# Patient Record
Sex: Female | Born: 1940 | Race: White | Hispanic: No | Smoking: Never smoker
Health system: Southern US, Community
[De-identification: ages and names within clinical notes are randomized; demographics above are authoritative.]

## PROBLEM LIST (undated history)

## (undated) DIAGNOSIS — C801 Malignant (primary) neoplasm, unspecified: Secondary | ICD-10-CM

## (undated) DIAGNOSIS — F419 Anxiety disorder, unspecified: Secondary | ICD-10-CM

## (undated) DIAGNOSIS — M810 Age-related osteoporosis without current pathological fracture: Secondary | ICD-10-CM

## (undated) DIAGNOSIS — H3530 Unspecified macular degeneration: Secondary | ICD-10-CM

## (undated) DIAGNOSIS — F605 Obsessive-compulsive personality disorder: Secondary | ICD-10-CM

## (undated) HISTORY — PX: MASTECTOMY: SHX3

## (undated) HISTORY — PX: NASAL SEPTUM SURGERY: SHX37

## (undated) HISTORY — PX: OOPHORECTOMY: SHX86

## (undated) HISTORY — DX: Unspecified macular degeneration: H35.30

## (undated) HISTORY — DX: Obsessive-compulsive personality disorder: F60.5

## (undated) HISTORY — PX: TUBAL LIGATION: SHX77

## (undated) HISTORY — DX: Anxiety disorder, unspecified: F41.9

## (undated) HISTORY — PX: BREAST SURGERY: SHX581

## (undated) HISTORY — DX: Age-related osteoporosis without current pathological fracture: M81.0

## (undated) HISTORY — PX: FOOT SURGERY: SHX648

## (undated) HISTORY — DX: Malignant (primary) neoplasm, unspecified: C80.1

## (undated) HISTORY — PX: TONSILLECTOMY AND ADENOIDECTOMY: SHX28

---

## 1994-01-31 LAB — HM MAMMOGRAPHY: HM MAMMO: NORMAL (ref 0–4)

## 1998-02-27 ENCOUNTER — Other Ambulatory Visit: Admission: RE | Admit: 1998-02-27 | Discharge: 1998-02-27 | Payer: Self-pay | Admitting: Obstetrics and Gynecology

## 1999-03-19 ENCOUNTER — Other Ambulatory Visit: Admission: RE | Admit: 1999-03-19 | Discharge: 1999-03-19 | Payer: Self-pay | Admitting: Obstetrics and Gynecology

## 2000-07-14 ENCOUNTER — Ambulatory Visit (HOSPITAL_BASED_OUTPATIENT_CLINIC_OR_DEPARTMENT_OTHER): Admission: RE | Admit: 2000-07-14 | Discharge: 2000-07-14 | Payer: Self-pay | Admitting: General Surgery

## 2000-07-14 ENCOUNTER — Encounter (INDEPENDENT_AMBULATORY_CARE_PROVIDER_SITE_OTHER): Payer: Self-pay | Admitting: *Deleted

## 2000-11-17 ENCOUNTER — Ambulatory Visit (HOSPITAL_COMMUNITY): Admission: RE | Admit: 2000-11-17 | Discharge: 2000-11-17 | Payer: Self-pay | Admitting: Gastroenterology

## 2003-01-30 ENCOUNTER — Ambulatory Visit (HOSPITAL_COMMUNITY): Admission: RE | Admit: 2003-01-30 | Discharge: 2003-01-30 | Payer: Self-pay | Admitting: Obstetrics and Gynecology

## 2003-05-08 ENCOUNTER — Ambulatory Visit (HOSPITAL_COMMUNITY): Admission: RE | Admit: 2003-05-08 | Discharge: 2003-05-08 | Payer: Self-pay | Admitting: Obstetrics and Gynecology

## 2003-09-08 ENCOUNTER — Ambulatory Visit (HOSPITAL_COMMUNITY): Admission: RE | Admit: 2003-09-08 | Discharge: 2003-09-08 | Payer: Self-pay | Admitting: Obstetrics and Gynecology

## 2004-08-06 ENCOUNTER — Encounter: Admission: RE | Admit: 2004-08-06 | Discharge: 2004-08-06 | Payer: Self-pay | Admitting: Obstetrics and Gynecology

## 2005-04-01 ENCOUNTER — Other Ambulatory Visit: Admission: RE | Admit: 2005-04-01 | Discharge: 2005-04-01 | Payer: Self-pay | Admitting: Obstetrics and Gynecology

## 2005-05-13 ENCOUNTER — Ambulatory Visit (HOSPITAL_COMMUNITY): Admission: RE | Admit: 2005-05-13 | Discharge: 2005-05-13 | Payer: Self-pay | Admitting: Obstetrics and Gynecology

## 2006-05-12 ENCOUNTER — Other Ambulatory Visit: Admission: RE | Admit: 2006-05-12 | Discharge: 2006-05-12 | Payer: Self-pay | Admitting: Obstetrics and Gynecology

## 2006-08-11 ENCOUNTER — Ambulatory Visit (HOSPITAL_COMMUNITY): Admission: RE | Admit: 2006-08-11 | Discharge: 2006-08-11 | Payer: Self-pay | Admitting: Obstetrics and Gynecology

## 2007-06-01 ENCOUNTER — Other Ambulatory Visit: Admission: RE | Admit: 2007-06-01 | Discharge: 2007-06-01 | Payer: Self-pay | Admitting: Obstetrics and Gynecology

## 2007-06-15 ENCOUNTER — Ambulatory Visit (HOSPITAL_COMMUNITY): Admission: RE | Admit: 2007-06-15 | Discharge: 2007-06-15 | Payer: Self-pay | Admitting: Obstetrics and Gynecology

## 2008-06-06 ENCOUNTER — Other Ambulatory Visit: Admission: RE | Admit: 2008-06-06 | Discharge: 2008-06-06 | Payer: Self-pay | Admitting: Obstetrics and Gynecology

## 2008-08-08 ENCOUNTER — Ambulatory Visit (HOSPITAL_COMMUNITY): Admission: RE | Admit: 2008-08-08 | Discharge: 2008-08-08 | Payer: Self-pay | Admitting: Unknown Physician Specialty

## 2009-06-05 ENCOUNTER — Other Ambulatory Visit: Admission: RE | Admit: 2009-06-05 | Discharge: 2009-06-05 | Payer: Self-pay | Admitting: Obstetrics and Gynecology

## 2010-01-31 HISTORY — PX: ABDOMINAL HYSTERECTOMY: SHX81

## 2010-01-31 LAB — HM COLONOSCOPY: HM Colonoscopy: NORMAL

## 2010-08-13 ENCOUNTER — Emergency Department (HOSPITAL_COMMUNITY)
Admission: EM | Admit: 2010-08-13 | Discharge: 2010-08-13 | Disposition: A | Discharge status: Home / Self Care | Payer: Medicare Other | Attending: Emergency Medicine | Admitting: Emergency Medicine

## 2010-08-13 ENCOUNTER — Emergency Department (HOSPITAL_COMMUNITY): Payer: Medicare Other

## 2010-08-13 DIAGNOSIS — Y9301 Activity, walking, marching and hiking: Secondary | ICD-10-CM | POA: Insufficient documentation

## 2010-08-13 DIAGNOSIS — M79609 Pain in unspecified limb: Secondary | ICD-10-CM | POA: Insufficient documentation

## 2010-08-13 DIAGNOSIS — S93609A Unspecified sprain of unspecified foot, initial encounter: Secondary | ICD-10-CM | POA: Insufficient documentation

## 2010-08-13 DIAGNOSIS — X500XXA Overexertion from strenuous movement or load, initial encounter: Secondary | ICD-10-CM | POA: Insufficient documentation

## 2011-06-09 ENCOUNTER — Other Ambulatory Visit (HOSPITAL_COMMUNITY)
Admission: RE | Admit: 2011-06-09 | Discharge: 2011-06-09 | Disposition: A | Discharge status: Home / Self Care | Payer: Medicare Other | Source: Ambulatory Visit | Attending: Family Medicine | Admitting: Family Medicine

## 2011-06-09 ENCOUNTER — Other Ambulatory Visit: Payer: Self-pay | Admitting: Family Medicine

## 2011-06-09 DIAGNOSIS — Z124 Encounter for screening for malignant neoplasm of cervix: Secondary | ICD-10-CM | POA: Insufficient documentation

## 2011-06-21 ENCOUNTER — Ambulatory Visit: Payer: Medicare Other | Attending: Internal Medicine | Admitting: Physical Therapy

## 2011-06-21 DIAGNOSIS — R293 Abnormal posture: Secondary | ICD-10-CM | POA: Insufficient documentation

## 2011-06-21 DIAGNOSIS — M6281 Muscle weakness (generalized): Secondary | ICD-10-CM | POA: Insufficient documentation

## 2011-06-21 DIAGNOSIS — IMO0001 Reserved for inherently not codable concepts without codable children: Secondary | ICD-10-CM | POA: Insufficient documentation

## 2011-07-05 ENCOUNTER — Ambulatory Visit: Payer: Medicare Other | Attending: Internal Medicine | Admitting: Physical Therapy

## 2011-07-05 DIAGNOSIS — IMO0001 Reserved for inherently not codable concepts without codable children: Secondary | ICD-10-CM | POA: Insufficient documentation

## 2011-07-05 DIAGNOSIS — R293 Abnormal posture: Secondary | ICD-10-CM | POA: Insufficient documentation

## 2011-07-05 DIAGNOSIS — M6281 Muscle weakness (generalized): Secondary | ICD-10-CM | POA: Insufficient documentation

## 2011-07-19 ENCOUNTER — Encounter: Payer: Medicare Other | Admitting: Physical Therapy

## 2011-07-21 ENCOUNTER — Ambulatory Visit: Payer: Medicare Other | Admitting: Physical Therapy

## 2011-07-26 ENCOUNTER — Ambulatory Visit: Payer: Medicare Other | Admitting: Physical Therapy

## 2011-08-02 ENCOUNTER — Encounter: Payer: Medicare Other | Admitting: Physical Therapy

## 2013-02-18 ENCOUNTER — Ambulatory Visit: Payer: Medicare PPO

## 2013-02-18 ENCOUNTER — Ambulatory Visit (INDEPENDENT_AMBULATORY_CARE_PROVIDER_SITE_OTHER): Payer: Medicare PPO | Admitting: Emergency Medicine

## 2013-02-18 VITALS — BP 124/82 | HR 74 | Temp 98.0°F | Resp 16 | Ht 68.0 in | Wt 150.0 lb

## 2013-02-18 DIAGNOSIS — IMO0001 Reserved for inherently not codable concepts without codable children: Secondary | ICD-10-CM

## 2013-02-18 DIAGNOSIS — M79609 Pain in unspecified limb: Secondary | ICD-10-CM

## 2013-02-18 DIAGNOSIS — S62639A Displaced fracture of distal phalanx of unspecified finger, initial encounter for closed fracture: Secondary | ICD-10-CM

## 2013-02-18 DIAGNOSIS — S6980XA Other specified injuries of unspecified wrist, hand and finger(s), initial encounter: Secondary | ICD-10-CM

## 2013-02-18 DIAGNOSIS — S6990XA Unspecified injury of unspecified wrist, hand and finger(s), initial encounter: Secondary | ICD-10-CM

## 2013-02-18 NOTE — Patient Instructions (Signed)
Finger Fracture  Fractures of fingers are breaks in the bones of the fingers. There are many types of fractures. There are different ways of treating these fractures. Your health care provider will discuss the best way to treat your fracture.  CAUSES  Traumatic injury is the main cause of broken fingers. These include:  · Injuries while playing sports.  · Workplace injuries.  · Falls.  RISK FACTORS  Activities that can increase your risk of finger fractures include:  · Sports.  · Workplace activities that involve machinery.  · A condition called osteoporosis, which can make your bones less dense and cause them to fracture more easily.  SIGNS AND SYMPTOMS  The main symptoms of a broken finger are pain and swelling within 15 minutes after the injury. Other symptoms include:  · Bruising of your finger.  · Stiffness of your finger.  · Numbness of your finger.  · Exposed bones (compound fracture) if the fracture is severe.  DIAGNOSIS   The best way to diagnose a broken bone is with X-ray imaging. Additionally, your health care provider will use this X-ray image to evaluate the position of the broken finger bones.   TREATMENT   Finger fractures can be treated with:   · Nonreduction This means the bones are in place. The finger is splinted without changing the positions of the bone pieces. The splint is usually left on for about a week to 10 days. This will depend on your fracture and what your health care provider thinks.  · Closed reduction The bones are put back into position without using surgery. The finger is then splinted.  · Open reduction and internal fixation The fracture site is opened. Then the bone pieces are fixed into place with pins or some type of hardware. This is seldom required. It depends on the severity of the fracture.  HOME CARE INSTRUCTIONS   · Follow your health care provider's instructions regarding activities, exercises, and physical therapy.  · Only take over-the-counter or prescription  medicines for pain, discomfort, or fever as directed by your health care provider.  SEEK MEDICAL CARE IF:  You have pain or swelling that limits the motion or use of your fingers.  SEEK IMMEDIATE MEDICAL CARE IF:   Your finger becomes numb.  MAKE SURE YOU:   · Understand these instructions.  · Will watch your condition.  · Will get help right away if you are not doing well or get worse.  Document Released: 05/01/2000 Document Revised: 11/07/2012 Document Reviewed: 08/29/2012  ExitCare® Patient Information ©2014 [employer].

## 2013-02-18 NOTE — Progress Notes (Addendum)
[facility] [address], [place] [address] [phone]  Date:  02/18/2013   Name:  Yolanda Robbins   DOB:  [birth year]   MRN:  [id number]  PCP:  No primary provider on file.    Chief Complaint: Finger Pain   History of Present Illness:  Yolanda Robbins is a 73 y.o. very pleasant female patient who presents with the following:  Tripped carrying a computer down the stairs and it crushed her left third finger.  Denies other injury related to fall.  Says it is worse since injury yesterday.  No improvement with over the counter medications or other home remedies. jd   There are no active problems to display for this patient.   Past Medical History  Diagnosis Date  . Cancer   . Osteoporosis     Past Surgical History  Procedure Laterality Date  . Breast surgery      History  Substance Use Topics  . Smoking status: Never Smoker   . Smokeless tobacco: Not on file  . Alcohol Use: No    Family History  Problem Relation Age of Onset  . Adopted: Yes    No Known Allergies  Medication list has been reviewed and updated.  No current outpatient prescriptions on file prior to visit.   No current facility-administered medications on file prior to visit.    Review of Systems:  As per HPI, otherwise negative.    Physical Examination: Filed Vitals:   02/18/13 1732  BP: 124/82  Pulse: 74  Temp: 98 F (36.7 C)  Resp: 16   Filed Vitals:   02/18/13 1732  Height: 5\' 8"  (1.727 m)  Weight: 150 lb (68.04 kg)   Body mass index is 22.81 kg/(m^2). Ideal Body Weight: Weight in (lb) to have BMI = 25: 164.1   GEN: WDWN, NAD, Non-toxic, Alert & Oriented x 3 HEENT: Atraumatic, Normocephalic.  Ears and Nose: No external deformity. EXTR: No clubbing/cyanosis/edema NEURO: Normal gait.  PSYCH: Normally interactive. Conversant. Not depressed or anxious appearing.  Calm demeanor.  Left hand:  Ecchymotic and swollen left third finger.  Has subungal  hematoma.  No deformity.  Assessment and Plan: subungal hematoma Evacuate hematoma  Signed,  Ellison Carwin, MD   [facility] reading (PRIMARY) by  Dr. Ouida Sills.  Tuft fracture.

## 2013-02-25 ENCOUNTER — Ambulatory Visit (INDEPENDENT_AMBULATORY_CARE_PROVIDER_SITE_OTHER): Payer: Medicare PPO | Admitting: Family Medicine

## 2013-02-25 ENCOUNTER — Encounter: Payer: Self-pay | Admitting: Family Medicine

## 2013-02-25 ENCOUNTER — Ambulatory Visit: Payer: Medicare PPO

## 2013-02-25 VITALS — BP 110/72 | HR 68 | Temp 98.0°F | Resp 16 | Ht 68.0 in | Wt 152.0 lb

## 2013-02-25 DIAGNOSIS — S6980XA Other specified injuries of unspecified wrist, hand and finger(s), initial encounter: Secondary | ICD-10-CM

## 2013-02-25 DIAGNOSIS — S62639A Displaced fracture of distal phalanx of unspecified finger, initial encounter for closed fracture: Secondary | ICD-10-CM

## 2013-02-25 DIAGNOSIS — M79609 Pain in unspecified limb: Secondary | ICD-10-CM

## 2013-02-25 DIAGNOSIS — S6990XA Unspecified injury of unspecified wrist, hand and finger(s), initial encounter: Secondary | ICD-10-CM

## 2013-02-25 NOTE — Patient Instructions (Signed)
1. You can wear long finger splint for one more week OR you can switch to short finger splint now. 2.  Wear finger splint for 4 weeks total. 3. Return in two weeks if you are still suffering with significant finger pain.

## 2013-02-25 NOTE — Progress Notes (Addendum)
This chart was scribed for Wardell Honour, MD by Einar Pheasant, ED Scribe. This patient was seen in room 11 and the patient's care was started at 8:26 PM. Subjective:    Patient ID: Yolanda Robbins, female    DOB: [birth year], 72 y.o.   MRN: [id number]  Chief Complaint  Patient presents with  . Follow-up    left hand middle finger smashed the tip of finger last week    HPI HPI Comments: Yolanda Robbins is a 73 y.o. female who [facility] for one week follow up of an injury to the middle finger of her left hand; +tuft fracture of L middle finger with subungal hematoma s/p evacuation by Dr. Ouida Sills. She was seen 1 week ago by Dr[facility] Ouida Sills [facility]; s/p subungal hematoma evacuation. Pt states that she injured her finger when she fell down the steps while carrying a computer in her hands. Pt reports no problems with finger unless she accidentally puts pressure on it. Pt states that everytime she changes her splint she applies peroxide to clean the area. She reports occasional tingling and mild tenderness on the side of her finger. Otherwise, pt is a healthy.  Compliance with finger splint.   Past Medical History  Diagnosis Date  . Cancer   . Osteoporosis    No Known Allergies No current outpatient prescriptions on file prior to visit.   No current facility-administered medications on file prior to visit.    Review of Systems  Musculoskeletal: Positive for arthralgias and joint swelling.  Skin: Positive for color change and wound (middle finger of left hand).      Triage Vitals: BP 110/72  Pulse 68  Temp(Src) 98 F (36.7 C) (Yolanda)  Resp 16  Ht 5\' 8"  (1.727 m)  Wt 152 lb (68.947 kg)  BMI 23.12 kg/m2  SpO2 99% Objective:   Physical Exam  Nursing note and vitals reviewed. Constitutional: She is oriented to person, place, and time. She appears well-developed and well-nourished. No distress.  HENT:  Head: Normocephalic and atraumatic.    Eyes: EOM are normal.  Neck: Neck supple.  Cardiovascular: Intact distal pulses.   Capillary refill < 3 seconds L 3rd digit.  Pulmonary/Chest: Effort normal.  Musculoskeletal:  L third finger with mild swelling at DIP joint and distally.  +mild TTP along lateral aspect of DIP and distal finger.    Neurological: She is alert and oriented to person, place, and time. No sensory deficit.  Skin: Skin is warm and dry.  L 3rd fingernail with residual hematoma.  Psychiatric: She has a normal mood and affect. Her behavior is normal.    [facility] reading (PRIMARY) by  Dr. Tamala [place].  L 3rd finger:  Stable tuft fracture.     No results found for this or any previous visit.  Assessment & Plan:   1. Closed fracture of tuft of distal phalanx of finger    1.  Closed tuft fracture L 3rd finger: stable; transition in upcoming week to shorter fold over finger splint to allow range of motion of DIP.  Continue fold over splint for maximum of 3-4 weeks.  RTC two weeks if continues to suffer with moderate pain.  Otherwise, follow-up PRN only.   2. Subungal hematoma: stable.  I personally performed the services described in this documentation, which was scribed in my presence.  The recorded information has been reviewed and is accurate.  Reginia Forts, M.D.  [facility]  [facility] [address] [place], [place]  [address] [phone] phone [phone] fax

## 2014-01-31 LAB — HM DEXA SCAN

## 2014-10-07 ENCOUNTER — Ambulatory Visit: Payer: Medicare PPO | Attending: Internal Medicine | Admitting: Physical Therapy

## 2014-10-07 ENCOUNTER — Encounter: Payer: Self-pay | Admitting: Physical Therapy

## 2014-10-07 DIAGNOSIS — R2689 Other abnormalities of gait and mobility: Secondary | ICD-10-CM

## 2014-10-07 DIAGNOSIS — R29818 Other symptoms and signs involving the nervous system: Secondary | ICD-10-CM | POA: Insufficient documentation

## 2014-10-07 DIAGNOSIS — R269 Unspecified abnormalities of gait and mobility: Secondary | ICD-10-CM | POA: Insufficient documentation

## 2014-10-07 DIAGNOSIS — R2681 Unsteadiness on feet: Secondary | ICD-10-CM

## 2014-10-07 DIAGNOSIS — R296 Repeated falls: Secondary | ICD-10-CM | POA: Diagnosis present

## 2014-10-08 NOTE — Therapy (Signed)
[facility] [address] [place], [place], [address] Phone: [phone]   Fax:  [phone]  Physical Therapy Evaluation  Patient Details  Name: Yolanda Robbins MRN: [id number] Date of Birth: December 27, 1940 Referring Provider:  Delrae Rend, MD  Encounter Date: 10/07/2014      PT End of Session - 10/07/14 1400    Visit Number 1   Number of Visits 4   Date for PT Re-Evaluation 11/07/14   Authorization Type Medicare G-code every 10th visit   PT Start Time 1400   PT Stop Time 1445   PT Time Calculation (min) 45 min   Equipment Utilized During Treatment Gait belt   Activity Tolerance Patient tolerated treatment well   Behavior During Therapy [facility] for tasks assessed/performed      Past Medical History  Diagnosis Date  . Cancer   . Osteoporosis     Past Surgical History  Procedure Laterality Date  . Breast surgery      There were no vitals filed for this visit.  Visit Diagnosis:  Abnormality of gait  Unsteadiness  Balance problems  Recurrent falls while walking      Subjective Assessment - 10/07/14 1403    Subjective She reports that she has fallen 4 times all with environmental issues as primary factor. She was presented for annual PCP appointment on 08/22/2014. She was referred to PT for evaluation & training for fall prevention. She presents for PT evaluation.    Patient Stated Goals She wants to improve her posture and improve balance.   Currently in Pain? No/denies            [facility] PT Assessment - 10/07/14 1400    Assessment   Medical Diagnosis Falls. gait abnormality   Onset Date/Surgical Date 08/22/14   Precautions   Precautions Fall   Restrictions   Weight Bearing Restrictions No   Balance Screen   Has the patient fallen in the past 6 months Yes   How many times? 4   Has the patient had a decrease in activity level because of a fear of falling?  No   Is the patient reluctant to leave  their home because of a fear of falling?  No   Home Environment   Living Environment Private residence   Living Arrangements Alone   Type of [place] to enter   Entrance Stairs-Number of Steps 2   Entrance Stairs-Rails None   Home Layout One level;Laundry or work area in [employer] in garage with 2 steps no rails   Prior Function   Level of Independence Independent;Independent with gait;Independent with transfers   Vocation Retired   Leisure aquatics   Observation/Other Assessments   Focus on Therapeutic Outcomes ([place])  97 Functional Status; Risk Adjusted 49%   Activities of Balance Confidence Scale (ABC Scale)  100%   Fear Avoidance Belief Questionnaire (FABQ)  19%   Posture/Postural Control   Posture/Postural Control Postural limitations   Postural Limitations Rounded Shoulders;Forward head;Increased thoracic kyphosis;Posterior pelvic tilt  Upper body scoliosis shift right to pelvis    ROM / Strength   AROM / PROM / Strength AROM;Strength   AROM   Overall AROM  Within functional limits for tasks performed   Strength   Overall Strength Within functional limits for tasks performed   Transfers   Transfers Sit to Stand;Stand to Sit   Sit to Stand 6: Modified independent (Device/Increase time);Without upper extremity assist;From chair/3-in-1  Stand to Sit 6: Modified independent (Device/Increase time);Without upper extremity assist;To chair/3-in-1   Ambulation/Gait   Ambulation/Gait Yes   Ambulation/Gait Assistance 5: Supervision   Ambulation Distance (Feet) 300 Feet   Assistive device None   Gait Pattern Within Functional Limits;Step-through pattern   Ambulation Surface Indoor;Level   Gait velocity 4.67 ft/sec comfortable; 6.01 ft/sec fast pace   Stairs Yes   Stairs Assistance 5: Supervision   Stair Management Technique No rails   Number of Stairs 4   Standardized Balance Assessment   Standardized Balance Assessment Berg Balance Test    Balance Master Testing Sensory Organization Test   Berg Balance Test   Sit to Stand Able to stand without using hands and stabilize independently   Standing Unsupported Able to stand safely 2 minutes   Sitting with Back Unsupported but Feet Supported on Floor or Stool Able to sit safely and securely 2 minutes   Stand to Sit Sits safely with minimal use of hands   Transfers Able to transfer safely, minor use of hands   Standing Unsupported with Eyes Closed Able to stand 10 seconds safely   Standing Ubsupported with Feet Together Able to place feet together independently and stand 1 minute safely   From Standing, Reach Forward with Outstretched Arm Can reach confidently >25 cm (10")   From Standing Position, Pick up Object from Floor Able to pick up shoe safely and easily   From Standing Position, Turn to Look Behind Over each Shoulder Looks behind from both sides and weight shifts well   Turn 360 Degrees Able to turn 360 degrees safely one side only in 4 seconds or less   Standing Unsupported, Alternately Place Feet on Step/Stool Able to stand independently and safely and complete 8 steps in 20 seconds   Standing Unsupported, One Foot in Front Able to plae foot ahead of the other independently and hold 30 seconds   Standing on One Leg Able to lift leg independently and hold equal to or more than 3 seconds   Total Score 52   Balance Master Testing    Results Composite score 70, "fall" without vision or somatosensory with no hip or step strategy, Sensory Analysis indicated vestibular input below age norms, Center of Gravity anterirly and left.   Functional Gait  Assessment   Gait assessed  Yes   Gait Level Surface Walks 20 ft in less than 5.5 sec, no assistive devices, good speed, no evidence for imbalance, normal gait pattern, deviates no more than 6 in outside of the 12 in walkway width.   Change in Gait Speed Able to smoothly change walking speed without loss of balance or gait deviation.  Deviate no more than 6 in outside of the 12 in walkway width.   Gait with Horizontal Head Turns Performs head turns smoothly with no change in gait. Deviates no more than 6 in outside 12 in walkway width   Gait with Vertical Head Turns Performs head turns with no change in gait. Deviates no more than 6 in outside 12 in walkway width.   Gait and Pivot Turn Pivot turns safely within 3 sec and stops quickly with no loss of balance.   Step Over Obstacle Is able to step over 2 stacked shoe boxes taped together (9 in total height) without changing gait speed. No evidence of imbalance.   Gait with Narrow Base of Support Ambulates 7-9 steps.   Gait with Eyes Closed Walks 20 ft, uses assistive device, slower speed, mild gait deviations,  deviates 6-10 in outside 12 in walkway width. Ambulates 20 ft in less than 9 sec but greater than 7 sec.   Ambulating Backwards Walks 20 ft, uses assistive device, slower speed, mild gait deviations, deviates 6-10 in outside 12 in walkway width.   Steps Alternating feet, no rail.   Total Score 27                             PT Short Term Goals - 16-Oct-2014 1400    PT SHORT TERM GOAL #2   Title --   Time --   Period --   Status --   PT SHORT TERM GOAL #3   Title --   Time --   Period --   Status --           PT Long Term Goals - 10-16-14 1400    PT LONG TERM GOAL #1   Title Patient demonstrates understanding of updated HEP / fitness plan. (Target Date: 4th visit)   Time 1   Period Months   Status New   PT LONG TERM GOAL #2   Title Functional Gait Assessment 30/30  (Target Date: 4th visit)   Time 1   Period Months   Status New   PT LONG TERM GOAL #3   Title Sensory Organization Test indicates all sensory analysis of systems within age norm.  (Target Date: 4th visit)   Time 1   Period Months   Status New                 G-Codes - 10/16/2014 1400    Functional Assessment Tool Used Functional Gait Assessment 27/30; Sensory  Organization Test Vestibular System below age norms with 1 "fall" during testing   Functional Limitation Mobility: Walking and moving around   Mobility: Walking and Moving Around Current Status [phone]) At least 20 percent but less than 40 percent impaired, limited or restricted   Mobility: Walking and Moving Around Goal Status [phone]) At least 1 percent but less than 20 percent impaired, limited or restricted       Problem List There are no active problems to display for this patient.   Jamey Reas PT, DPT 10/08/2014, 4:56 PM  [facility] [address] [place], [place], [address] Phone: [phone]   Fax:  [phone]

## 2014-10-23 ENCOUNTER — Encounter: Payer: Medicare PPO | Admitting: Physical Therapy

## 2014-10-28 ENCOUNTER — Encounter: Payer: Self-pay | Admitting: Physical Therapy

## 2014-10-28 ENCOUNTER — Ambulatory Visit: Payer: Medicare PPO | Admitting: Physical Therapy

## 2014-10-28 DIAGNOSIS — R2689 Other abnormalities of gait and mobility: Secondary | ICD-10-CM

## 2014-10-28 DIAGNOSIS — R269 Unspecified abnormalities of gait and mobility: Secondary | ICD-10-CM

## 2014-10-28 DIAGNOSIS — R296 Repeated falls: Secondary | ICD-10-CM

## 2014-10-28 DIAGNOSIS — R2681 Unsteadiness on feet: Secondary | ICD-10-CM

## 2014-10-28 NOTE — Patient Instructions (Signed)
Feet Heel-Toe "Tandem" (Compliant Surface) Head Motion - Eyes Open   With eyes open, standing on compliant surface: _foam or pillow_______, right foot directly in front of the other, move head slowly: up and down, right-left, up-right to down-left, up-left to down-right.   Repeat __10__ times with right foot in front & 10 times with left foot in front.  Copyright  VHI. All rights reserved.  Feet Partial Heel-Toe (Compliant Surface) Head Motion - Eyes Closed   Stand on compliant surface: ____foam or pillow____ with right foot partially in front of the other. Close eyes and move head slowly, up and down, right-left, up-right to down-left, up-left to down-right.   Repeat __10__ times with right foot in front & 10 times with left foot in front. Copyright  VHI. All rights reserved.

## 2014-10-28 NOTE — Therapy (Signed)
[facility] [address] [place], [place], [address] Phone: [phone]   Fax:  [phone]  Physical Therapy Treatment  Patient Details  Name: Yolanda Robbins MRN: [id number] Date of Birth: [birth year] Referring Provider:  Delrae Rend, MD  Encounter Date: 10/28/2014      PT End of Session - 10/28/14 0930    Visit Number 2   Number of Visits 4   Date for PT Re-Evaluation 11/07/14   Authorization Type Medicare G-code every 10th visit   PT Start Time 0935   PT Stop Time 1015   PT Time Calculation (min) 40 min   Equipment Utilized During Treatment Gait belt   Activity Tolerance Patient tolerated treatment well   Behavior During Therapy [facility] for tasks assessed/performed      Past Medical History  Diagnosis Date  . Cancer   . Osteoporosis     Past Surgical History  Procedure Laterality Date  . Breast surgery      There were no vitals filed for this visit.  Visit Diagnosis:  Abnormality of gait  Unsteadiness  Balance problems  Recurrent falls while walking      Subjective Assessment - 10/28/14 0935    Subjective She fell when she slipped on wet wood porch. No bruises or injuries.   Currently in Pain? No/denies                                 PT Education - 10/28/14 0930    Education provided Yes   Education Details PWR! seated & standing exercises, compliant surface vestibular / head turns, ankle vs hip vs step strategy for balance   Person(s) Educated Patient   Methods Explanation;Demonstration;Tactile cues;Verbal cues;Handout   Comprehension Verbalized understanding;Returned demonstration;Verbal cues required;Tactile cues required;Need further instruction          PT Short Term Goals - 10/07/14 1400    PT SHORT TERM GOAL #2   Title --   Time --   Period --   Status --   PT SHORT TERM GOAL #3   Title --   Time --   Period --   Status --           PT  Long Term Goals - 10/07/14 1400    PT LONG TERM GOAL #1   Title Patient demonstrates understanding of updated HEP / fitness plan. (Target Date: 4th visit)   Time 1   Period Months   Status New   PT LONG TERM GOAL #2   Title Functional Gait Assessment 30/30  (Target Date: 4th visit)   Time 1   Period Months   Status New   PT LONG TERM GOAL #3   Title Sensory Organization Test indicates all sensory analysis of systems within age norm.  (Target Date: 4th visit)   Time 1   Period Months   Status New               Plan - 10/28/14 0935    Clinical Impression Statement Patient has general understanding of PWR! & vestibular exercises but needs further instruction to fully understand.   Pt will benefit from skilled therapeutic intervention in order to improve on the following deficits Abnormal gait;Decreased activity tolerance;Decreased balance;Decreased safety awareness;Decreased mobility   Rehab Potential Good   PT Frequency 1x / week   PT Duration 4 weeks   PT Treatment/Interventions DME Instruction;Gait training;Stair training;Functional mobility training;Balance training;Therapeutic  exercise;Therapeutic activities;Neuromuscular re-education;Vestibular   PT Next Visit Plan assess PWR! seated exercises and add standing; review vestibular exercises, add alternate wt shift stepping activities including changing directions   Consulted and Agree with Plan of Care Patient        Problem List There are no active problems to display for this patient.   Jamey Reas PT, DPT 10/28/2014, 9:11 PM  [facility] [address] [place], [place], [address] Phone: [phone]   Fax:  [phone]

## 2014-11-04 ENCOUNTER — Other Ambulatory Visit: Payer: Self-pay | Admitting: Family Medicine

## 2014-11-04 DIAGNOSIS — R1900 Intra-abdominal and pelvic swelling, mass and lump, unspecified site: Secondary | ICD-10-CM

## 2014-11-05 ENCOUNTER — Other Ambulatory Visit: Payer: Self-pay | Admitting: Family Medicine

## 2014-11-05 DIAGNOSIS — R1900 Intra-abdominal and pelvic swelling, mass and lump, unspecified site: Secondary | ICD-10-CM

## 2014-11-06 ENCOUNTER — Encounter: Payer: Self-pay | Admitting: Physical Therapy

## 2014-11-06 ENCOUNTER — Ambulatory Visit: Payer: Medicare PPO | Attending: Internal Medicine | Admitting: Physical Therapy

## 2014-11-06 DIAGNOSIS — R29818 Other symptoms and signs involving the nervous system: Secondary | ICD-10-CM | POA: Diagnosis present

## 2014-11-06 DIAGNOSIS — R2681 Unsteadiness on feet: Secondary | ICD-10-CM | POA: Insufficient documentation

## 2014-11-06 DIAGNOSIS — R296 Repeated falls: Secondary | ICD-10-CM

## 2014-11-06 DIAGNOSIS — R269 Unspecified abnormalities of gait and mobility: Secondary | ICD-10-CM

## 2014-11-06 DIAGNOSIS — R2689 Other abnormalities of gait and mobility: Secondary | ICD-10-CM

## 2014-11-06 NOTE — Therapy (Signed)
[facility] [address] [place], [place], [address] Phone: [phone]   Fax:  [phone]  Physical Therapy Treatment  Patient Details  Name: Yolanda Robbins MRN: [id number] Date of Birth: [birth year] Referring Provider:  Delrae Rend, MD  Encounter Date: 11/06/2014      PT End of Session - 11/06/14 0805    Visit Number 3   Number of Visits 4   Date for PT Re-Evaluation 11/07/14   Authorization Type Medicare G-code every 10th visit   PT Start Time 0805   PT Stop Time 0845   PT Time Calculation (min) 40 min   Equipment Utilized During Treatment Gait belt   Activity Tolerance Patient tolerated treatment well   Behavior During Therapy [facility] for tasks assessed/performed      Past Medical History  Diagnosis Date  . Cancer ([place])   . Osteoporosis     Past Surgical History  Procedure Laterality Date  . Breast surgery      There were no vitals filed for this visit.  Visit Diagnosis:  Unsteadiness  Balance problems  Abnormality of gait  Recurrent falls while walking      Subjective Assessment - 11/06/14 0805    Subjective No falls. She has been doing exercises but not sure if correct.   Currently in Pain? No/denies     Therapeutic Exercise: Patient performed HEP of seated & standing PWR! Exercises with PT verbally correcting with demo technique especially to turn head to look with movement so upper body is engaged. Standing on compliant surfaces with modified tandem with eyes open & eyes closed with head movements up/down, right /left, up-right/down-left and up-left/down-right with cues on technique. With repetition & instruction, patient was able to demonstrate better understanding of HEP.  PT recommended trying a basic Yoga class at her fitness club.                            PT Education - 11/06/14 0805    Education provided Yes   Education Details Reviewed PWR! seated &  standing and vestibular on compliant surfaces, need to work on exercises on ground in addition to her aquatic exercise classes, 3 balance systems   Person(s) Educated Patient   Methods Explanation;Demonstration;Tactile cues;Verbal cues   Comprehension Verbalized understanding;Returned demonstration;Verbal cues required;Tactile cues required;Need further instruction          PT Short Term Goals - 10/07/14 1400    PT SHORT TERM GOAL #2   Title --   Time --   Period --   Status --   PT SHORT TERM GOAL #3   Title --   Time --   Period --   Status --           PT Long Term Goals - 10/07/14 1400    PT LONG TERM GOAL #1   Title Patient demonstrates understanding of updated HEP / fitness plan. (Target Date: 4th visit)   Time 1   Period Months   Status New   PT LONG TERM GOAL #2   Title Functional Gait Assessment 30/30  (Target Date: 4th visit)   Time 1   Period Months   Status New   PT LONG TERM GOAL #3   Title Sensory Organization Test indicates all sensory analysis of systems within age norm.  (Target Date: 4th visit)   Time 1   Period Months   Status New  Plan - 11/06/14 0805    Clinical Impression Statement Patient needs to move her head with PWR! exercises to engage upper body more which should carry over into balance more. Patient improved understanding of HEP and need for out of water exercises to help her balance.   Pt will benefit from skilled therapeutic intervention in order to improve on the following deficits Abnormal gait;Decreased activity tolerance;Decreased balance;Decreased safety awareness;Decreased mobility   Rehab Potential Good   PT Frequency 1x / week   PT Duration 4 weeks   PT Treatment/Interventions DME Instruction;Gait training;Stair training;Functional mobility training;Balance training;Therapeutic exercise;Therapeutic activities;Neuromuscular re-education;Vestibular   PT Next Visit Plan Check LTGs,  add alternate wt shift  stepping activities including changing directions   Consulted and Agree with Plan of Care Patient        Problem List There are no active problems to display for this patient.   Jamey Reas PT, DPT  11/06/2014, 12:21 PM  [facility] [address] [place], [place], [address] Phone: [phone]   Fax:  [phone]

## 2014-11-13 ENCOUNTER — Encounter: Payer: Medicare PPO | Admitting: Physical Therapy

## 2014-11-19 ENCOUNTER — Ambulatory Visit: Payer: Medicare PPO | Admitting: Physical Therapy

## 2014-11-19 ENCOUNTER — Encounter: Payer: Self-pay | Admitting: Physical Therapy

## 2014-11-19 DIAGNOSIS — R2689 Other abnormalities of gait and mobility: Secondary | ICD-10-CM

## 2014-11-19 DIAGNOSIS — R269 Unspecified abnormalities of gait and mobility: Secondary | ICD-10-CM

## 2014-11-19 DIAGNOSIS — R2681 Unsteadiness on feet: Secondary | ICD-10-CM | POA: Diagnosis not present

## 2014-11-20 NOTE — Therapy (Signed)
[facility] [address] [place], [place], [address] Phone: [phone]   Fax:  [phone]  Physical Therapy Treatment  Patient Details  Name: Yolanda Robbins MRN: [id number] Date of Birth: [birth year] Referring Provider: Delrae Rend, MD  Encounter Date: 11/19/2014  PHYSICAL THERAPY DISCHARGE SUMMARY  Visits from Start of Care: 4  Current functional level related to goals / functional outcomes: See below   Remaining deficits: See below   Education / Equipment: HEP, fall prevention strategies  Plan: Patient agrees to discharge.  Patient goals were met. Patient is being discharged due to meeting the stated rehab goals.  ?????            PT End of Session - 11/19/14 0845    Visit Number 4   Number of Visits 4   Date for PT Re-Evaluation 11/07/14   Authorization Type Medicare G-code every 10th visit   PT Start Time 0851   PT Stop Time 0931   PT Time Calculation (min) 40 min   Equipment Utilized During Treatment Gait belt   Activity Tolerance Patient tolerated treatment well   Behavior During Therapy WFL for tasks assessed/performed      Past Medical History  Diagnosis Date  . Cancer ([place])   . Osteoporosis     Past Surgical History  Procedure Laterality Date  . Breast surgery      There were no vitals filed for this visit.  Visit Diagnosis:  Abnormality of gait  Unsteadiness  Balance problems      Subjective Assessment - 11/19/14 0858    Subjective (p) No falls. She has not been to pool to enable rash to clear. She has been doing exercises some, her chiropractor requested hold exercises 3 days after manipulation.   Currently in Pain? (p) No/denies            [facility] PT Assessment - 11/19/14 0845    Assessment   Referring Provider Delrae Rend, MD   Restrictions   Weight Bearing Restrictions --   Observation/Other Assessments   Focus on Therapeutic Outcomes (FOTO)  93 Functional  Status, Risk adjusted at initial was 49   Fear Avoidance Belief Questionnaire (FABQ)  19%   Ambulation/Gait   Ambulation/Gait Yes   Ambulation/Gait Assistance 7: Independent   Ambulation Distance (Feet) 300 Feet   Assistive device None   Gait Pattern Within Functional Limits   Ambulation Surface Indoor;Level   Stairs Yes   Stairs Assistance 7: Independent   Stair Management Technique No rails   Number of Stairs 4   Berg Balance Test   Sit to Stand Able to stand without using hands and stabilize independently   Standing Unsupported Able to stand safely 2 minutes   Sitting with Back Unsupported but Feet Supported on Floor or Stool Able to sit safely and securely 2 minutes   Stand to Sit Sits safely with minimal use of hands   Transfers Able to transfer safely, minor use of hands   Standing Unsupported with Eyes Closed Able to stand 10 seconds safely   Standing Ubsupported with Feet Together Able to place feet together independently and stand 1 minute safely   From Standing, Reach Forward with Outstretched Arm Can reach confidently >25 cm (10")   From Standing Position, Pick up Object from Floor Able to pick up shoe safely and easily   From Standing Position, Turn to Look Behind Over each Shoulder Looks behind from both sides and weight shifts well   Turn  360 Degrees Able to turn 360 degrees safely in 4 seconds or less   Standing Unsupported, Alternately Place Feet on Step/Stool Able to stand independently and safely and complete 8 steps in 20 seconds   Standing Unsupported, One Foot in [place] to place foot tandem independently and hold 30 seconds   Standing on One Leg Able to lift leg independently and hold 5-10 seconds   Total Score 55   Balance Master Testing    Results Composite score 78 with only 1 of 18 tests in 6 conditions below age norm, all sensory analysis above age norm, strategy analysis WNL, Center of Gravity all within base of support   Functional Gait  Assessment    Gait assessed  Yes   Gait Level Surface Walks 20 ft in less than 5.5 sec, no assistive devices, good speed, no evidence for imbalance, normal gait pattern, deviates no more than 6 in outside of the 12 in walkway width.   Change in Gait Speed Able to smoothly change walking speed without loss of balance or gait deviation. Deviate no more than 6 in outside of the 12 in walkway width.   Gait with Horizontal Head Turns Performs head turns smoothly with no change in gait. Deviates no more than 6 in outside 12 in walkway width   Gait with Vertical Head Turns Performs head turns with no change in gait. Deviates no more than 6 in outside 12 in walkway width.   Gait and Pivot Turn Pivot turns safely within 3 sec and stops quickly with no loss of balance.   Step Over Obstacle Is able to step over 2 stacked shoe boxes taped together (9 in total height) without changing gait speed. No evidence of imbalance.   Gait with Narrow Base of Support Is able to ambulate for 10 steps heel to toe with no staggering.   Gait with Eyes Closed Walks 20 ft, no assistive devices, good speed, no evidence of imbalance, normal gait pattern, deviates no more than 6 in outside 12 in walkway width. Ambulates 20 ft in less than 7 sec.   Ambulating Backwards Walks 20 ft, no assistive devices, good speed, no evidence for imbalance, normal gait   Steps Alternating feet, no rail.   Total Score 30                             PT Education - 11/19/14 0845    Education provided Yes   Education Details PT reviewed HEP / fitness plan to include exercise on-ground as well as pool with components to address strength, flexibility, balance & endurance, recommendation for Yoga to work on posture and balance   Person(s) Educated Patient   Methods Explanation   Comprehension Verbalized understanding          PT Short Term Goals - 10/07/14 1400    PT SHORT TERM GOAL #2   Title --   Time --   Period --   Status --    PT SHORT TERM GOAL #3   Title --   Time --   Period --   Status --           PT Long Term Goals - 11/19/14 0845    PT LONG TERM GOAL #1   Title Patient demonstrates understanding of updated HEP / fitness plan. (Target Date: 4th visit)   Baseline MET 11/19/2014   Time 1   Period Months   Status Achieved  PT LONG TERM GOAL #2   Title Functional Gait Assessment 30/30  (Target Date: 4th visit)   Baseline MET 12-05-2014   Time 1   Period Months   Status Achieved   PT LONG TERM GOAL #3   Title Sensory Organization Test indicates all sensory analysis of systems within age norm.  (Target Date: 4th visit)   Baseline MET 12/05/2014   Time 1   Period Months   Status Achieved               Plan - 2014/12/05 0845    Clinical Impression Statement Patient met all LTGs. Patient did "trip" 1x while ambulating during last 2 PT sessions and she was able to use a step strategy to prevent fall. Initially her step strategy was delayed which could result in a fall.   Pt will benefit from skilled therapeutic intervention in order to improve on the following deficits Abnormal gait;Decreased activity tolerance;Decreased balance;Decreased safety awareness;Decreased mobility   Rehab Potential Good   PT Frequency 1x / week   PT Duration 4 weeks   PT Treatment/Interventions DME Instruction;Gait training;Stair training;Functional mobility training;Balance training;Therapeutic exercise;Therapeutic activities;Neuromuscular re-education;Vestibular   PT Next Visit Plan Discharge   Consulted and Agree with Plan of Care Patient          G-Codes - 12-05-2014 0845    Functional Assessment Tool Used Functional Gait Assessment 30/30; Sensory Organization Test all within age norm   Functional Limitation Mobility: Walking and moving around   Mobility: Walking and Moving Around Goal Status [phone]) At least 1 percent but less than 20 percent impaired, limited or restricted   Mobility: Walking and Moving  Around Discharge Status [phone]) At least 1 percent but less than 20 percent impaired, limited or restricted      Problem List There are no active problems to display for this patient.   Jamey Reas PT, DPT 11/20/2014, 6:39 AM  [facility] [address] [place], [place], [address] Phone: [phone]   Fax:  [phone]  Name: Yolanda Robbins MRN: [id number] Date of Birth: Mar 18, 1940

## 2014-11-22 ENCOUNTER — Emergency Department (HOSPITAL_COMMUNITY)
Admission: EM | Admit: 2014-11-22 | Discharge: 2014-11-22 | Disposition: A | Discharge status: Home / Self Care | Payer: Medicare PPO | Attending: Emergency Medicine | Admitting: Emergency Medicine

## 2014-11-22 ENCOUNTER — Encounter (HOSPITAL_COMMUNITY): Payer: Self-pay | Admitting: Emergency Medicine

## 2014-11-22 DIAGNOSIS — Z859 Personal history of malignant neoplasm, unspecified: Secondary | ICD-10-CM | POA: Diagnosis not present

## 2014-11-22 DIAGNOSIS — Z79899 Other long term (current) drug therapy: Secondary | ICD-10-CM | POA: Diagnosis not present

## 2014-11-22 DIAGNOSIS — K529 Noninfective gastroenteritis and colitis, unspecified: Secondary | ICD-10-CM | POA: Insufficient documentation

## 2014-11-22 DIAGNOSIS — M810 Age-related osteoporosis without current pathological fracture: Secondary | ICD-10-CM | POA: Insufficient documentation

## 2014-11-22 DIAGNOSIS — L905 Scar conditions and fibrosis of skin: Secondary | ICD-10-CM | POA: Insufficient documentation

## 2014-11-22 DIAGNOSIS — R112 Nausea with vomiting, unspecified: Secondary | ICD-10-CM | POA: Diagnosis present

## 2014-11-22 LAB — COMPREHENSIVE METABOLIC PANEL
ALT: 14 U/L (ref 14–54)
ANION GAP: 5 (ref 5–15)
AST: 24 U/L (ref 15–41)
Albumin: 3.6 g/dL (ref 3.5–5.0)
Alkaline Phosphatase: 62 U/L (ref 38–126)
BUN: 16 mg/dL (ref 6–20)
CHLORIDE: 107 mmol/L (ref 101–111)
CO2: 27 mmol/L (ref 22–32)
CREATININE: 0.65 mg/dL (ref 0.44–1.00)
Calcium: 8.4 mg/dL — ABNORMAL LOW (ref 8.9–10.3)
Glucose, Bld: 95 mg/dL (ref 65–99)
POTASSIUM: 4.1 mmol/L (ref 3.5–5.1)
Sodium: 139 mmol/L (ref 135–145)
Total Bilirubin: 0.6 mg/dL (ref 0.3–1.2)
Total Protein: 6 g/dL — ABNORMAL LOW (ref 6.5–8.1)

## 2014-11-22 LAB — CBC WITH DIFFERENTIAL/PLATELET
BASOS ABS: 0 10*3/uL (ref 0.0–0.1)
Basophils Relative: 0 %
EOS PCT: 1 %
Eosinophils Absolute: 0.1 10*3/uL (ref 0.0–0.7)
HCT: 39.8 % (ref 36.0–46.0)
Hemoglobin: 13.2 g/dL (ref 12.0–15.0)
LYMPHS PCT: 11 %
Lymphs Abs: 1.1 10*3/uL (ref 0.7–4.0)
MCH: 31.5 pg (ref 26.0–34.0)
MCHC: 33.2 g/dL (ref 30.0–36.0)
MCV: 95 fL (ref 78.0–100.0)
MONO ABS: 0.8 10*3/uL (ref 0.1–1.0)
Monocytes Relative: 8 %
Neutro Abs: 8.3 10*3/uL — ABNORMAL HIGH (ref 1.7–7.7)
Neutrophils Relative %: 80 %
PLATELETS: 154 10*3/uL (ref 150–400)
RBC: 4.19 MIL/uL (ref 3.87–5.11)
RDW: 13.5 % (ref 11.5–15.5)
WBC: 10.3 10*3/uL (ref 4.0–10.5)

## 2014-11-22 LAB — LIPASE, BLOOD: Lipase: 26 U/L (ref 11–51)

## 2014-11-22 MED ORDER — ONDANSETRON HCL 4 MG PO TABS
4.0000 mg | ORAL_TABLET | Freq: Four times a day (QID) | ORAL | Status: DC
Start: 2014-11-22 — End: 2015-09-14

## 2014-11-22 MED ORDER — SODIUM CHLORIDE 0.9 % IV BOLUS (SEPSIS)
1000.0000 mL | Freq: Once | INTRAVENOUS | Status: AC
  Administered 2014-11-22: 1000 mL via INTRAVENOUS

## 2014-11-22 MED ORDER — ONDANSETRON HCL 4 MG/2ML IJ SOLN
4.0000 mg | Freq: Once | INTRAMUSCULAR | Status: AC
  Administered 2014-11-22: 4 mg via INTRAVENOUS
  Filled 2014-11-22: qty 2

## 2014-11-22 NOTE — ED Notes (Signed)
RN [facility] getting blood

## 2014-11-22 NOTE — ED Notes (Signed)
Patient attended a pot luck at church today about 1 hour ago. Patient reports 2-3 episodes of diarrhea and emesis within the last hour.

## 2014-11-22 NOTE — ED Provider Notes (Signed)
CSN: [id number]     Arrival date & time 11/22/14  1506 History   First MD Initiated Contact with Patient 11/22/14 1517     Chief Complaint  Patient presents with  . Nausea  . Emesis  . Diarrhea     (Consider location/radiation/quality/duration/timing/severity/associated sxs/prior Treatment) HPI....... nausea, vomiting, diarrhea approximately 1 hour prior to visit. Patient was at a church [employer]. No prodromal illnesses. She feels dehydrated. This is unusual for her. Severity of symptoms is moderate. Nothing makes symptoms better or worse.  Past Medical History  Diagnosis Date  . Cancer ([place])   . Osteoporosis    Past Surgical History  Procedure Laterality Date  . Breast surgery     Family History  Problem Relation Age of Onset  . Adopted: Yes   Social History  Substance Use Topics  . Smoking status: Never Smoker   . Smokeless tobacco: None  . Alcohol Use: No   OB History    No data available     Review of Systems  All other systems reviewed and are negative.     Allergies  Review of patient's allergies indicates no known allergies.  Home Medications   Prior to Admission medications   Medication Sig Start Date End Date Taking? Authorizing Provider  calcium-vitamin D (OSCAL WITH D) 250-125 MG-UNIT per tablet Take 1 tablet by mouth daily.   Yes Historical Provider, MD  cholecalciferol (VITAMIN D) 1000 UNITS tablet Take 2,000 Units by mouth daily.   Yes Historical Provider, MD  Cyanocobalamin (VITAMIN B 12 PO) Take 1 tablet by mouth daily.   Yes Historical Provider, MD  TURMERIC PO Take 1 tablet by mouth daily.   Yes Historical Provider, MD  ondansetron (ZOFRAN) 4 MG tablet Take 1 tablet (4 mg total) by mouth every 6 (six) hours. 11/22/14   Nat Christen, MD   BP 122/53 mmHg  Pulse 88  Temp(Src) 97.8 F (36.6 C) (Oral)  Resp 16  Ht 5\' 8"  (1.727 m)  Wt 139 lb (63.05 kg)  BMI 21.14 kg/m2  SpO2 98% Physical Exam  Constitutional: She is oriented to  person, place, and time.  Dry mucous membranes.  HENT:  Head: Normocephalic and atraumatic.  Eyes: Conjunctivae and EOM are normal. Pupils are equal, round, and reactive to light.  Neck: Normal range of motion. Neck supple.  Cardiovascular: Normal rate and regular rhythm.   Pulmonary/Chest: Effort normal and breath sounds normal.  Abdominal: Soft. Bowel sounds are normal.  Oblique surgical scar left lower quadrant  Musculoskeletal: Normal range of motion.  Neurological: She is alert and oriented to person, place, and time.  Skin: Skin is warm and dry.  Psychiatric: She has a normal mood and affect. Her behavior is normal.  Nursing note and vitals reviewed.   ED Course  Procedures (including critical care time) Labs Review Labs Reviewed  CBC WITH DIFFERENTIAL/PLATELET - Abnormal; Notable for the following:    Neutro Abs 8.3 (*)    All other components within normal limits  COMPREHENSIVE METABOLIC PANEL - Abnormal; Notable for the following:    Calcium 8.4 (*)    Total Protein 6.0 (*)    All other components within normal limits  LIPASE, BLOOD    Imaging Review No results found. I have personally reviewed and evaluated these images and lab results as part of my medical decision-making.   EKG Interpretation None      MDM   Final diagnoses:  Gastroenteritis    History of physical consistent with  gastroenteritis. No acute abdomen. Patient feels better after IV fluids and IV Zofran. Discharge medications Zofran 4 mg    Nat Christen, MD 11/22/14 1745

## 2014-11-22 NOTE — ED Notes (Signed)
MD at bedside. 

## 2014-11-22 NOTE — Discharge Instructions (Signed)
Clear liquids for the next 12 hours. Medication for nausea. Rest. Return if worse

## 2014-12-24 ENCOUNTER — Ambulatory Visit
Admission: RE | Admit: 2014-12-24 | Discharge: 2014-12-24 | Disposition: A | Discharge status: Home / Self Care | Payer: Medicare PPO | Source: Ambulatory Visit | Attending: Family Medicine | Admitting: Family Medicine

## 2014-12-24 DIAGNOSIS — R1900 Intra-abdominal and pelvic swelling, mass and lump, unspecified site: Secondary | ICD-10-CM

## 2015-04-06 ENCOUNTER — Other Ambulatory Visit: Payer: Self-pay | Admitting: Obstetrics & Gynecology

## 2015-04-06 DIAGNOSIS — R768 Other specified abnormal immunological findings in serum: Secondary | ICD-10-CM

## 2015-04-13 LAB — BASIC METABOLIC PANEL
BUN: 19 mg/dL (ref 4–21)
Creatinine: 0.6 mg/dL (ref 0.5–1.1)
GLUCOSE: 92 mg/dL
POTASSIUM: 4.5 mmol/L (ref 3.4–5.3)
SODIUM: 139 mmol/L (ref 137–147)

## 2015-04-13 LAB — LIPID PANEL
Cholesterol: 192 mg/dL (ref 0–200)
HDL: 116 mg/dL — AB (ref 35–70)
LDL Cholesterol: 105 mg/dL
Triglycerides: 52 mg/dL (ref 40–160)

## 2015-04-13 LAB — CBC AND DIFFERENTIAL
HCT: 38 % (ref 36–46)
Hemoglobin: 12.8 g/dL (ref 12.0–16.0)
Platelets: 196 10*3/uL (ref 150–399)
WBC: 6.5 10^3/mL

## 2015-04-13 LAB — TSH: TSH: 3.16 u[IU]/mL (ref 0.41–5.90)

## 2015-04-14 ENCOUNTER — Other Ambulatory Visit: Payer: Self-pay | Admitting: Family Medicine

## 2015-04-14 DIAGNOSIS — R413 Other amnesia: Secondary | ICD-10-CM

## 2015-04-14 DIAGNOSIS — R198 Other specified symptoms and signs involving the digestive system and abdomen: Secondary | ICD-10-CM

## 2015-04-14 DIAGNOSIS — Z853 Personal history of malignant neoplasm of breast: Secondary | ICD-10-CM

## 2015-04-21 ENCOUNTER — Other Ambulatory Visit: Payer: Self-pay | Admitting: Family Medicine

## 2015-04-21 DIAGNOSIS — R413 Other amnesia: Secondary | ICD-10-CM

## 2015-05-01 ENCOUNTER — Ambulatory Visit
Admission: RE | Admit: 2015-05-01 | Discharge: 2015-05-01 | Disposition: A | Discharge status: Home / Self Care | Payer: Medicare PPO | Source: Ambulatory Visit | Attending: Family Medicine | Admitting: Family Medicine

## 2015-05-01 ENCOUNTER — Other Ambulatory Visit: Payer: Self-pay | Admitting: Family Medicine

## 2015-05-01 DIAGNOSIS — R413 Other amnesia: Secondary | ICD-10-CM

## 2015-05-01 DIAGNOSIS — R0789 Other chest pain: Secondary | ICD-10-CM

## 2015-05-01 DIAGNOSIS — R198 Other specified symptoms and signs involving the digestive system and abdomen: Secondary | ICD-10-CM

## 2015-05-01 DIAGNOSIS — Z853 Personal history of malignant neoplasm of breast: Secondary | ICD-10-CM

## 2015-06-25 ENCOUNTER — Encounter: Payer: Self-pay | Admitting: Obstetrics & Gynecology

## 2015-06-25 ENCOUNTER — Ambulatory Visit (INDEPENDENT_AMBULATORY_CARE_PROVIDER_SITE_OTHER): Payer: Medicare PPO | Admitting: Obstetrics & Gynecology

## 2015-06-25 VITALS — BP 152/79 | HR 79

## 2015-06-25 DIAGNOSIS — L03115 Cellulitis of right lower limb: Secondary | ICD-10-CM

## 2015-06-25 MED ORDER — SULFAMETHOXAZOLE-TRIMETHOPRIM 800-160 MG PO TABS: 1.0000 | ORAL_TABLET | Freq: Two times a day (BID) | ORAL | Status: DC

## 2015-06-25 NOTE — Progress Notes (Signed)
Patient ID: Yolanda Robbins, female   DOB: October 04, 1940, 75 y.o.   MRN: [id number] History:  75 y.o. No obstetric history on file. here today for eval of erythematous region on right shin. Pt reports hitting let on object 12 days prev.  She has been placing neosporin and H2O2 on lesion.    Med list reviewed  Review of Systems:  Pertinent items are noted in HPI.  Objective:  Physical Exam Blood pressure 152/79, pulse 79. Gen: NAD Ext: right lower ext on shin. Erythema surrounding lesion.  Slight warmth. No purulent drainage noted   Assessment & Plan:  Skin infection of lower extremity  Bactrim DS 1 po bid x 5 days F/u prn  Yolanda Robbins, M.D., Yolanda Robbins

## 2015-09-14 ENCOUNTER — Ambulatory Visit (INDEPENDENT_AMBULATORY_CARE_PROVIDER_SITE_OTHER): Payer: Medicare PPO | Admitting: Internal Medicine

## 2015-09-14 ENCOUNTER — Encounter: Payer: Self-pay | Admitting: Internal Medicine

## 2015-09-14 VITALS — BP 138/70 | HR 66 | Temp 97.8°F | Ht 68.0 in | Wt 136.0 lb

## 2015-09-14 DIAGNOSIS — M810 Age-related osteoporosis without current pathological fracture: Secondary | ICD-10-CM | POA: Diagnosis not present

## 2015-09-14 DIAGNOSIS — R279 Unspecified lack of coordination: Secondary | ICD-10-CM | POA: Diagnosis not present

## 2015-09-14 DIAGNOSIS — Z789 Other specified health status: Secondary | ICD-10-CM | POA: Insufficient documentation

## 2015-09-14 DIAGNOSIS — R4789 Other speech disturbances: Secondary | ICD-10-CM | POA: Insufficient documentation

## 2015-09-14 DIAGNOSIS — R350 Frequency of micturition: Secondary | ICD-10-CM | POA: Insufficient documentation

## 2015-09-14 DIAGNOSIS — R739 Hyperglycemia, unspecified: Secondary | ICD-10-CM | POA: Diagnosis not present

## 2015-09-14 LAB — COMPLETE METABOLIC PANEL WITH GFR
ALT: 13 U/L (ref 6–29)
AST: 23 U/L (ref 10–35)
Albumin: 4.1 g/dL (ref 3.6–5.1)
Alkaline Phosphatase: 76 U/L (ref 33–130)
BUN: 13 mg/dL (ref 7–25)
CO2: 31 mmol/L (ref 20–31)
Calcium: 9.6 mg/dL (ref 8.6–10.4)
Chloride: 101 mmol/L (ref 98–110)
Creat: 0.63 mg/dL (ref 0.60–0.93)
GFR, Est African American: >89 mL/min (ref >=60)
GFR, Est Non African American: 88 mL/min (ref >=60)
Glucose, Bld: 79 mg/dL (ref 65–99)
Potassium: 4.4 mmol/L (ref 3.5–5.3)
Sodium: 139 mmol/L (ref 135–146)
Total Bilirubin: 0.8 mg/dL (ref 0.2–1.2)
Total Protein: 6.9 g/dL (ref 6.1–8.1)

## 2015-09-14 LAB — CBC WITH DIFFERENTIAL/PLATELET
Basophils Absolute: 0 cells/uL (ref 0–200)
Basophils Relative: 0 %
Eosinophils Absolute: 177 cells/uL (ref 15–500)
Eosinophils Relative: 3 %
HCT: 39.6 % (ref 35.0–45.0)
Hemoglobin: 13.4 g/dL (ref 11.7–15.5)
Lymphs Abs: 1534 cells/uL (ref 850–3900)
MCH: 30.9 pg (ref 27.0–33.0)
MCHC: 33.8 g/dL (ref 32.0–36.0)
MCV: 91.5 fL (ref 80.0–100.0)
MPV: 10.8 fL (ref 7.5–12.5)
Monocytes Absolute: 590 cells/uL (ref 200–950)
Monocytes Relative: 10 %
Neutro Abs: 3599 cells/uL (ref 1500–7800)
Neutrophils Relative %: 61 %
Platelets: 186 10*3/uL (ref 140–400)
RBC: 4.33 MIL/uL (ref 3.80–5.10)
RDW: 13.9 % (ref 11.0–15.0)
WBC: 5.9 10*3/uL (ref 3.8–10.8)

## 2015-09-14 LAB — VITAMIN B12: Vitamin B-12: 597 pg/mL (ref 200–1100)

## 2015-09-14 MED ORDER — VITAMIN D3 50 MCG (2000 UT) PO CAPS: 2000.0000 [IU] | ORAL_CAPSULE | Freq: Every day | ORAL | 5 refills | Status: DC

## 2015-09-14 NOTE — Patient Instructions (Signed)
Change vitamin D with K to vitamin D3 2000 units daily.

## 2015-09-14 NOTE — Progress Notes (Signed)
Provider:  Rexene Edison. Mariea Clonts, D.O., C.M.D. Location:   [facility]   Place of Service:   clinic  Previous PCP: Hollace Kinnier, DO Patient Care Team: Gayland Curry, DO as PCP - General (Geriatric Medicine)  Extended Emergency Contact Information Primary Emergency Contact: Amash,Heidi Address: [address]          [place], [place] [address] Johnnette Litter of [place] Phone: [phone] Mobile Phone: [phone] Relation: Daughter  Code Status: DNR Goals of Care: Advanced Directive information Advanced Directives 09/14/2015  Does patient have an advance directive? No  Type of Advance Directive -  Copy of advanced directive(s) in chart? -  Would patient like information on creating an advanced directive? Yes - Geneticist, molecular Complaint  Patient presents with  . Establish Care    new patient    HPI: Patient is a 75 y.o. female seen today to establish with [facility].  Records have been requested from [facility] and integrative medicine.  She took medication for lyme disease from Dr. Moshe Cipro.  She was unable to function, talk or answer questions.  He felt her medications were too strong.  He referred her to psychiatry for evaluation and then return for chelation, etc.  She had lost a lot of weight and was not herself.  Her daughter Ishmael Holter also has spoke with Dr. Tamala Julian (her ob/gyn).  She was on zoloft short term for 3 months.    She got into a turn lane too early and got pulled over on her way to her daughter's house.  Police officer asked about corrective lenses--she was unable to wear her glasses then for the testing.  She was unable to walk a straight line or follow his finger.  She had not been drinking, of course.  She reports having had a lot of sugar beforehand.    Her daughter thinks she is completely exhausted.   She had been at church camp, developed a rash, went to Dr. Drema Dallas.  She had cognitive issues which was the reason she saw integrative  medicine.  Was not typical rash.  Initially, one test was positive, but then when [facility] rechecked it, it was negative.  Had been treated and chelated.    Because of her cognitive function, lead testing was also done which was high at Dr. Rosemarie Ax and normal at [facility].  She does have house paint older than the '80s.    She does have some difficulty with word finding.  If she is slightly stressed, her hands shake.  She says her hands shaking has improved.    A friend has been making the alkaline water (beauty water) and she was drinking that water which was not meant to be drunk.  Happened a long time ago.    She passed out at church after having been to the fair.  She had to lie down and passed out afterwards.  She was hospitalized with gastroenteritis.  Has not been "right since".   Appetite is better, but she is still losing weight.  She is having difficulty getting enough sleep.  She wakes up frequently to urinate.  lavendar helps her rest.  No blood in stools, dark stools.  Weight actually may have gone up.    Admits to having some trouble remembering names or words.  Her daughter says she is remembering events in sequence generally.  She has always been a terrible driver.    She has had some falls.  [address] [place] a [place]ave[place] once.  Also hit some wet leaves on a hill at her rental home.  Her daughter notes not aware of perception very well.  Will hit herself with her car door.  She's not sure when her eye exam is due for this.  Her daughter stays on top of her appts.  Only taking supplements.  Not on pills.    Both of them report her spirits are good.    Only missed one on her memory test at the psychiatrist.    She has a fear of taking medications for osteoporosis b/c she worked as a Copywriter, advertising for years.    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    Right foot occasionally feels wet, but it isn't wet when she reaches down.      Has urinary frequency.   Has urgency and incontinence at times.  Does kegels.  Wears a thick pad daily.  Says she also has a prolapse.  Uses lighter pad the past few days and having fewer problems.  Does try to drink a lot water.     Past Medical History:  Diagnosis Date  . Cancer ([place])   . Osteoporosis    Past Surgical History:  Procedure Laterality Date  . BREAST SURGERY    . FOOT SURGERY    . HYSTERECTOMY ABDOMINAL WITH SALPINGECTOMY  2010  . MASTECTOMY    . TONSILLECTOMY AND ADENOIDECTOMY      Social History   Social History  . Marital status: Widowed    Spouse name: N/A  . Number of children: N/A  . Years of education: N/A   Social History Main Topics  . Smoking status: Never Smoker  . Smokeless tobacco: None  . Alcohol use No  . Drug use: No  . Sexual activity: Not Asked   Other Topics Concern  . None   Social History Narrative  . None    reports that she has never smoked. She does not have any smokeless tobacco history on file. She reports that she does not drink alcohol or use drugs.  Functional Status Survey:    Family History  Problem Relation Age of Onset  . Adopted: Yes    Health Maintenance  Topic Date Due  . TETANUS/TDAP  05/03/1959  . COLONOSCOPY  05/03/1990  . ZOSTAVAX  05/02/2000  . DEXA SCAN  05/02/2005  . PNA vac Low Risk Adult (1 of 2 - PCV13) 05/02/2005  . INFLUENZA VACCINE  09/01/2015    No Known Allergies    Medication List       Accurate as of 09/14/15  9:36 AM. Always use your most recent med list.          b complex vitamins tablet Take 1 tablet by mouth daily.   JUICE PLUS FIBRE PO Take 1 tablet by mouth daily.   TURMERIC PO Take 1 tablet by mouth daily.   VITAMIN K2-VITAMIN D3 PO Take 10 drops by mouth daily.       Review of Systems  Constitutional: Positive for weight loss. Negative for chills, fever and malaise/fatigue.  HENT: Negative for congestion and hearing loss.   Eyes: Negative for blurred vision.  Respiratory:  Negative for cough and shortness of breath.   Cardiovascular: Negative for chest pain, palpitations and leg swelling.  Gastrointestinal: Negative for abdominal pain, blood in stool, constipation, diarrhea and melena.  Genitourinary: Negative for dysuria, frequency and urgency.  Musculoskeletal: Negative for falls.  Skin: Negative for  itching and rash.  Neurological: Negative for dizziness, loss of consciousness, weakness and headaches.       Unsteady and coordination poor  Endo/Heme/Allergies: Does not bruise/bleed easily.  Psychiatric/Behavioral: Positive for memory loss. The patient does not have insomnia.     Vitals:   09/14/15 0914  BP: 138/70  Pulse: 66  Temp: 97.8 F (36.6 C)  TempSrc: Oral  SpO2: 96%  Weight: 136 lb (61.7 kg)  Height: '5\' 8"'  (1.727 m)   Body mass index is 20.68 kg/m. Physical Exam  Constitutional: She is oriented to person, place, and time. She appears well-developed and well-nourished. No distress.  Thin white female  Cardiovascular: Normal rate, regular rhythm, normal heart sounds and intact distal pulses.   Pulmonary/Chest: Effort normal and breath sounds normal.  Musculoskeletal: Normal range of motion.  Some difficulty holding onto items on her lap   Neurological: She is alert and oriented to person, place, and time.  Skin: Skin is warm and dry.  A few small excoriations of her shins   Psychiatric: She has a normal mood and affect.   Labs reviewed: Basic Metabolic Panel:  Recent Labs  11/22/14 1606 04/13/15  NA 139 139  K 4.1 4.5  CL 107  --   CO2 27  --   GLUCOSE 95  --   BUN 16 19  CREATININE 0.65 0.6  CALCIUM 8.4*  --    Liver Function Tests:  Recent Labs  11/22/14 1606  AST 24  ALT 14  ALKPHOS 62  BILITOT 0.6  PROT 6.0*  ALBUMIN 3.6    Recent Labs  11/22/14 1606  LIPASE 26   No results for input(s): AMMONIA in the last 8760 hours. CBC:  Recent Labs  11/22/14 1606 04/13/15  WBC 10.3 6.5  NEUTROABS 8.3*  --     HGB 13.2 12.8  HCT 39.8 38  MCV 95.0  --   PLT 154 196   Cardiac Enzymes: No results for input(s): CKTOTAL, CKMB, CKMBINDEX, TROPONINI in the last 8760 hours. BNP: Invalid input(s): POCBNP No results found for: HGBA1C Lab Results  Component Value Date   TSH 3.16 04/13/2015   Reviewed new patient packet, records from integrative medicine and also records from [place], Dr. Drema Dallas  Assessment/Plan 1. Word finding difficulty - seems mild and may have improved -? An effect from the multiple herbal supplements she was taking and chelation therapy after she was thought to have lyme disease and lead poisoning -will await psychiatry records and also recheck MMSE here next visit w/ clock drawing  2. Lack of coordination -her daughter has noticed she bumps herself frequently and we noticed she had trouble holding onto her items during the appt, has a slight tremor, as well -needs through neuro exam at next appt  3. Urinary frequency - continue kegels, consider myrbetriq 32m samples at next visit - CBC with Differential/Platelets - CMP with eGFR  4. Senile osteoporosis - refuses any of the meds due to risk of osteonecrosis of the jaw - must also engage in more weightbearing exercise (does attend exercise programs with her daughter) - Cholecalciferol (VITAMIN D3) 2000 units capsule; Take 1 capsule (2,000 Units total) by mouth daily.  Dispense: 30 capsule; Refill: 5 - CBC with Differential/Platelets - CMP with eGFR - Vitamin B12  5. Hyperglycemia - labs show slightly elevated glucose in the past and she reported feeling funny after a high glucose load so will check hba1c - Hemoglobin A1c - CBC with Differential/Platelets - CMP with  eGFR  6. Vegetarian diet -b12 level due to cognitive changes and diet  Labs/tests ordered: Orders Placed This Encounter  Procedures  . HM MAMMOGRAPHY    This external order was created through the Results Console.  [name] HM DEXA SCAN    This external  order was created through the Results Console.  . CBC and differential    This external order was created through the Results Console.  . Basic metabolic panel    This external order was created through the Results Console.  . Lipid panel    This external order was created through the Results Console.  . TSH    This external order was created through the Results Console.  . Hemoglobin A1c  . CBC with Differential/Platelets  . CMP with eGFR  . Vitamin B12  . HM COLONOSCOPY    This external order was created through the Results Console.   Zaxton Angerer L. Skylan Gift, D.O. [facility] [address], [place] [address] Cell Phone (Mon-Fri 8am-5pm):  [phone] On Call:  [phone] & follow prompts after 5pm & weekends Office Phone:  [phone] Office Fax:  [phone]

## 2015-09-15 ENCOUNTER — Encounter: Payer: Self-pay | Admitting: *Deleted

## 2015-09-15 LAB — HEMOGLOBIN A1C
Hgb A1c MFr Bld: 5.4 % (ref <5.7)
Mean Plasma Glucose: 108 mg/dL

## 2015-09-18 ENCOUNTER — Telehealth: Payer: Self-pay

## 2015-09-18 NOTE — Telephone Encounter (Signed)
Message left on triage voicemail: Patient was told to stop k+ and vit D drops and start OTC vit D3 2000iu. Patient would like to know if its ok for her to remain on drops until she is able to get OTC vit D   Please advise

## 2015-09-21 ENCOUNTER — Encounter: Payer: Self-pay | Admitting: Internal Medicine

## 2015-09-21 ENCOUNTER — Ambulatory Visit (INDEPENDENT_AMBULATORY_CARE_PROVIDER_SITE_OTHER): Payer: Medicare PPO | Admitting: Internal Medicine

## 2015-09-21 VITALS — BP 120/60 | HR 76 | Temp 97.7°F | Wt 137.0 lb

## 2015-09-21 DIAGNOSIS — R278 Other lack of coordination: Secondary | ICD-10-CM

## 2015-09-21 DIAGNOSIS — R413 Other amnesia: Secondary | ICD-10-CM

## 2015-09-21 DIAGNOSIS — M810 Age-related osteoporosis without current pathological fracture: Secondary | ICD-10-CM

## 2015-09-21 NOTE — Progress Notes (Signed)
Location:  [facility] clinic Provider:  Algie Cales L. Mariea Clonts, D.O., C.M.D.  Goals of Care:  Advanced Directives 09/21/2015  Does patient have an advance directive? No  Type of Advance Directive -  Copy of advanced directive(s) in chart? -  Would patient like information on creating an advanced directive? Yes - Geneticist, molecular Complaint  Patient presents with  . Medical Management of Chronic Issues    1 week follow-up and neurology exam  . MMSE    27/30 passed clock    HPI: Patient is a 75 y.o. female seen today for medical management of chronic diseases.    Previously she had missed only one question on the memory test (29 and now 27/30).  Passed clock, but not quite.  She is still not sleeping well.  Is sleeping in bed now.  She has been working hard to conquer her hoarding (had clothes on bed before).    Has appt with Dr. Shirley Muscat in Sept, but on cancellation list.    Past Medical History:  Diagnosis Date  . Cancer ([place])   . Osteoporosis    Past Surgical History:  Procedure Laterality Date  . BREAST SURGERY    . FOOT SURGERY    . HYSTERECTOMY ABDOMINAL WITH SALPINGECTOMY  2010  . MASTECTOMY    . TONSILLECTOMY AND ADENOIDECTOMY      No Known Allergies    Medication List       Accurate as of 09/21/15  8:51 AM. Always use your most recent med list.          b complex vitamins tablet Take 1 tablet by mouth daily.   JUICE PLUS FIBRE PO Take 1 tablet by mouth daily.   TURMERIC PO Take 1 tablet by mouth daily.   Vitamin D3 2000 units capsule Take 1 capsule (2,000 Units total) by mouth daily.       Review of Systems:  Review of Systems  Constitutional: Negative for chills, fever and malaise/fatigue.  HENT: Negative for hearing loss.   Eyes: Negative for blurred vision.  Respiratory: Negative for shortness of breath.   Cardiovascular: Negative for chest pain, palpitations and leg swelling.  Gastrointestinal: Negative for abdominal pain,  nausea and vomiting.  Genitourinary: Negative for dysuria, frequency and urgency.  Musculoskeletal: Negative for falls and myalgias.       Bumps legs on things frequently  Skin: Negative for itching and rash.  Neurological: Negative for dizziness, tingling, tremors, sensory change, speech change, focal weakness, seizures, loss of consciousness, weakness and headaches.  Psychiatric/Behavioral: Positive for memory loss. Negative for depression. The patient is nervous/anxious.     Health Maintenance  Topic Date Due  . ZOSTAVAX  05/02/2000  . INFLUENZA VACCINE  09/01/2015  . COLONOSCOPY  02/01/2020  . TETANUS/TDAP  01/31/2022  . DEXA SCAN  Completed  . PNA vac Low Risk Adult  Completed    Physical Exam: Vitals:   09/21/15 0831  BP: 120/60  Pulse: 76  Temp: 97.7 F (36.5 C)  TempSrc: Oral  Weight: 137 lb (62.1 kg)   Body mass index is 20.83 kg/m. Physical Exam  Constitutional: She appears well-nourished. No distress.  Thin white female  HENT:  Head: Normocephalic and atraumatic.  Eyes: Conjunctivae and EOM are normal. Pupils are equal, round, and reactive to light.  Neck: Neck supple.  Cardiovascular: Normal rate, regular rhythm, normal heart sounds and intact distal pulses.   Pulmonary/Chest: Effort normal and breath sounds normal.  Musculoskeletal: Normal range of  motion.  Neurological: She is alert. She has normal reflexes. She displays normal reflexes. No cranial nerve deficit. She exhibits normal muscle tone. Coordination abnormal.  Oriented to time, not precise place (floor); difficulty with 3 stage commands, difficulty with coordination with heel-to-shin and seemed to fall forward with body when getting off exam table; slight action tremor of UEs; incorrect number of fingers indicated during visual field testing for bilateral lower quadrants, correct for upper quadrants  Skin: Skin is warm and dry.  Small abrasion right shin; palpable staples along line of tram-flap  surgery on abdomen  Psychiatric: She has a normal mood and affect. Her behavior is normal.    Labs reviewed: Basic Metabolic Panel:  Recent Labs  11/22/14 1606 04/13/15 09/14/15 1051  NA 139 139 139  K 4.1 4.5 4.4  CL 107  --  101  CO2 27  --  31  GLUCOSE 95  --  79  BUN 16 19 13   CREATININE 0.65 0.6 0.63  CALCIUM 8.4*  --  9.6  TSH  --  3.16  --    Liver Function Tests:  Recent Labs  11/22/14 1606 09/14/15 1051  AST 24 23  ALT 14 13  ALKPHOS 62 76  BILITOT 0.6 0.8  PROT 6.0* 6.9  ALBUMIN 3.6 4.1    Recent Labs  11/22/14 1606  LIPASE 26   No results for input(s): AMMONIA in the last 8760 hours. CBC:  Recent Labs  11/22/14 1606 04/13/15 09/14/15 1051  WBC 10.3 6.5 5.9  NEUTROABS 8.3*  --  3,599  HGB 13.2 12.8 13.4  HCT 39.8 38 39.6  MCV 95.0  --  91.5  PLT 154 196 186   Lipid Panel:  Recent Labs  04/13/15  CHOL 192  HDL 116*  LDLCALC 105  TRIG 52   Lab Results  Component Value Date   HGBA1C 5.4 09/14/2015    Assessment/Plan 1. Memory loss - 27/30 on mmse, passed clock, but it took her a long time and placement is not precise of hands - suspect neurologic process that involves the optic nerve/pituitary region vs. brainstem?   - MR Brain Wo Contrast; Future  2. Coordination abnormal - see above, seems there something amiss with her coordination--check MRI and if it is not helpful, I will refer her to a neurologist for further workup - MR Brain Wo Contrast; Future  3. Senile osteoporosis -cont vitamin D 2000 units daily and check level today -had been taking 10000 units daily but supplement had vitamin K in it which she does not need - Vitamin D, 25-hydroxy -is going to check with Dr. Buddy Duty about bone density -she is refusing any osteoporosis therapy besides vitamins and weightbearing exercise  Labs/tests ordered:   Orders Placed This Encounter  Procedures  . MR Brain Wo Contrast    Standing Status:   Future    Standing Expiration  Date:   11/20/2016    Order Specific Question:   Reason for Exam (SYMPTOM  OR DIAGNOSIS REQUIRED)    Answer:   memory loss, coordination difficulties    Order Specific Question:   Preferred imaging location?    Answer:   [facility] (table limit-550lbs)    Order Specific Question:   What is the patient's sedation requirement?    Answer:   No Sedation    Order Specific Question:   Does the patient have a pacemaker or implanted devices?    Answer:   No    Comments:  staples in abdomen from tram-flap    Order Specific Question:   Call Results- Best Contact Number?    AnswerIY:[phone]  . Vitamin D, 25-hydroxy   Next appt:  2 wks f/u MRI  Becci Batty L. Roza Creamer, D.O. [facility] [address], [place] [address] Cell Phone (Mon-Fri 8am-5pm):  [phone] On Call:  [phone] & follow prompts after 5pm & weekends Office Phone:  [phone] Office Fax:  [phone]

## 2015-09-22 ENCOUNTER — Encounter: Payer: Self-pay | Admitting: *Deleted

## 2015-09-22 LAB — VITAMIN D 25 HYDROXY (VIT D DEFICIENCY, FRACTURES): Vit D, 25-Hydroxy: 55 ng/mL (ref 30–100)

## 2015-09-22 NOTE — Telephone Encounter (Signed)
Patient was seen yesterday by Dr.Reed

## 2015-09-29 ENCOUNTER — Ambulatory Visit
Admission: RE | Admit: 2015-09-29 | Discharge: 2015-09-29 | Disposition: A | Discharge status: Home / Self Care | Payer: Medicare PPO | Source: Ambulatory Visit | Attending: Internal Medicine | Admitting: Internal Medicine

## 2015-09-29 DIAGNOSIS — R278 Other lack of coordination: Secondary | ICD-10-CM

## 2015-09-29 DIAGNOSIS — R413 Other amnesia: Secondary | ICD-10-CM

## 2015-10-07 ENCOUNTER — Other Ambulatory Visit: Payer: Self-pay | Admitting: Internal Medicine

## 2015-10-07 DIAGNOSIS — R4789 Other speech disturbances: Secondary | ICD-10-CM

## 2015-10-07 DIAGNOSIS — R278 Other lack of coordination: Secondary | ICD-10-CM

## 2015-10-07 DIAGNOSIS — R413 Other amnesia: Secondary | ICD-10-CM

## 2015-10-12 ENCOUNTER — Ambulatory Visit: Payer: Medicare PPO | Admitting: Internal Medicine

## 2015-11-02 ENCOUNTER — Encounter: Payer: Self-pay | Admitting: Internal Medicine

## 2015-11-02 ENCOUNTER — Ambulatory Visit (INDEPENDENT_AMBULATORY_CARE_PROVIDER_SITE_OTHER): Payer: Medicare PPO | Admitting: Internal Medicine

## 2015-11-02 VITALS — BP 130/70 | HR 75 | Temp 98.0°F | Wt 136.0 lb

## 2015-11-02 DIAGNOSIS — D369 Benign neoplasm, unspecified site: Secondary | ICD-10-CM | POA: Diagnosis not present

## 2015-11-02 DIAGNOSIS — R279 Unspecified lack of coordination: Secondary | ICD-10-CM

## 2015-11-02 DIAGNOSIS — R4789 Other speech disturbances: Secondary | ICD-10-CM | POA: Diagnosis not present

## 2015-11-02 NOTE — Progress Notes (Signed)
Location:  [facility] clinic Provider: Javayah Magaw L. Mariea Clonts, D.O., C.M.D.  Code Status: DNR Goals of Care:  Advanced Directives 09/21/2015  Does patient have an advance directive? No  Type of Advance Directive -  Copy of advanced directive(s) in chart? -  Would patient like information on creating an advanced directive? Yes - Environmental education officer Complaint  Patient presents with  . Referral    gastro referral    HPI: Patient is a 75 y.o. female seen today for an acute visit for request for her referral for cscope.   Needs her colonoscopy and needs a referral to GI b/c of adenomatous polyps in the past--has been going every 2 years.  Last time was told 4 years.  Sees Dr. Earlean Shawl.    November neurology appt is still pending re: memory loss and word finding difficulty.    She also followed up at psychology.  Past Medical History:  Diagnosis Date  . Cancer ([place])   . Osteoporosis     Past Surgical History:  Procedure Laterality Date  . BREAST SURGERY    . FOOT SURGERY    . HYSTERECTOMY ABDOMINAL WITH SALPINGECTOMY  2010  . MASTECTOMY    . TONSILLECTOMY AND ADENOIDECTOMY      No Known Allergies    Medication List       Accurate as of 11/02/15  2:51 PM. Always use your most recent med list.          b complex vitamins tablet Take 1 tablet by mouth daily.   JUICE PLUS FIBRE PO Take 1 tablet by mouth daily.   TURMERIC PO Take 1 tablet by mouth daily.   Vitamin D3 5000 units Caps Take 1 capsule by mouth daily.       Review of Systems:  Review of Systems  Constitutional: Positive for weight loss. Negative for chills, fever and malaise/fatigue.       Down 2 more lbs  HENT: Negative for hearing loss.   Eyes: Negative for blurred vision.  Respiratory: Negative for shortness of breath.   Cardiovascular: Negative for chest pain and palpitations.  Gastrointestinal: Negative for abdominal pain, blood in stool, constipation and melena.       H/o polyps    Genitourinary: Negative for dysuria.  Musculoskeletal: Negative for falls.       Balance problems  Skin: Negative for rash.  Neurological: Negative for dizziness, loss of consciousness and weakness.  Psychiatric/Behavioral: Positive for memory loss. Negative for depression.       Word-finding difficulty    Health Maintenance  Topic Date Due  . ZOSTAVAX  05/02/2000  . INFLUENZA VACCINE  04/30/2016 (Originally 09/01/2015)  . COLONOSCOPY  02/01/2020  . TETANUS/TDAP  01/31/2022  . DEXA SCAN  Completed  . PNA vac Low Risk Adult  Completed    Physical Exam: Vitals:   11/02/15 1435  BP: 130/70  Pulse: 75  Temp: 98 F (36.7 C)  TempSrc: Oral  SpO2: 96%  Weight: 136 lb (61.7 kg)   Body mass index is 20.68 kg/m. Physical Exam  Constitutional: She is oriented to person, place, and time. No distress.  Thin female  Eyes:  glasses  Cardiovascular: Normal rate, regular rhythm, normal heart sounds and intact distal pulses.   Pulmonary/Chest: Effort normal and breath sounds normal. No respiratory distress.  Abdominal: Soft. Bowel sounds are normal.  Musculoskeletal: Normal range of motion.  Neurological: She is alert and oriented to person, place, and time.  Skin:  Skin is warm and dry. Capillary refill takes less than 2 seconds.  Psychiatric: She has a normal mood and affect.    Labs reviewed: Basic Metabolic Panel:  Recent Labs  11/22/14 1606 04/13/15 09/14/15 1051  NA 139 139 139  K 4.1 4.5 4.4  CL 107  --  101  CO2 27  --  31  GLUCOSE 95  --  79  BUN 16 19 13   CREATININE 0.65 0.6 0.63  CALCIUM 8.4*  --  9.6  TSH  --  3.16  --    Liver Function Tests:  Recent Labs  11/22/14 1606 09/14/15 1051  AST 24 23  ALT 14 13  ALKPHOS 62 76  BILITOT 0.6 0.8  PROT 6.0* 6.9  ALBUMIN 3.6 4.1    Recent Labs  11/22/14 1606  LIPASE 26   No results for input(s): AMMONIA in the last 8760 hours. CBC:  Recent Labs  11/22/14 1606 04/13/15 09/14/15 1051  WBC 10.3 6.5  5.9  NEUTROABS 8.3*  --  3,599  HGB 13.2 12.8 13.4  HCT 39.8 38 39.6  MCV 95.0  --  91.5  PLT 154 196 186   Lipid Panel:  Recent Labs  04/13/15  CHOL 192  HDL 116*  LDLCALC 105  TRIG 52   Lab Results  Component Value Date   HGBA1C 5.4 09/14/2015   Assessment/Plan 1. Multiple adenomatous polyps - due for cscope recheck and needs referral due to [facility] insurance - Ambulatory referral to Gastroenterology  2. Word finding difficulty -seemed less severe today when at appt alone  3. Lack of coordination -noted during neuro exam with heel to shin testing  -needs neurologist to evaluate for unusual dementia type possibly--doubt run of the mill AD, but could have a vascular etiology  Labs/tests ordered:   Orders Placed This Encounter  Procedures  . Ambulatory referral to Gastroenterology    Referral Priority:   Routine    Referral Type:   Consultation    Referral Reason:   Specialty Services Required    Number of Visits Requested:   1    Next appt:  3 mos for AWV with LPN and CPE with me  Bunny Lowdermilk L. Sherrye Puga, D.O. [facility] [address],  [address] Cell Phone (Mon-Fri 8am-5pm):  [phone] On Call:  [phone] & follow prompts after 5pm & weekends Office Phone:  [phone] Office Fax:  [phone]

## 2015-11-16 ENCOUNTER — Encounter: Payer: Self-pay | Admitting: Internal Medicine

## 2015-12-04 ENCOUNTER — Ambulatory Visit (INDEPENDENT_AMBULATORY_CARE_PROVIDER_SITE_OTHER): Payer: Medicare PPO | Admitting: Neurology

## 2015-12-04 ENCOUNTER — Encounter: Payer: Self-pay | Admitting: Neurology

## 2015-12-04 VITALS — BP 128/66 | HR 93 | Ht 68.0 in | Wt 140.6 lb

## 2015-12-04 DIAGNOSIS — R413 Other amnesia: Secondary | ICD-10-CM | POA: Diagnosis not present

## 2015-12-04 DIAGNOSIS — G3184 Mild cognitive impairment, so stated: Secondary | ICD-10-CM | POA: Insufficient documentation

## 2015-12-04 DIAGNOSIS — R4789 Other speech disturbances: Secondary | ICD-10-CM | POA: Diagnosis not present

## 2015-12-04 NOTE — Patient Instructions (Signed)
1. Schedule Neurocognitive testing with Dr. Si Raider 2. Physical exercise and brain stimulation exercises are important for brain health 3. Follow-up after testing

## 2015-12-04 NOTE — Progress Notes (Signed)
NEUROLOGY CONSULTATION NOTE  Yolanda Robbins MRN: [id number] DOB: 21-Sep-1940  Referring provider: Dr. Hollace Kinnier Primary care provider: Dr. Hollace Kinnier  Reason for consult:  Memory loss  Dear Dr Mariea Clonts:  Thank you for your kind referral of Yolanda Robbins for consultation of the above symptoms. Although her history is well known to you, please allow me to reiterate it for the purpose of our medical record. The patient was accompanied to the clinic by her daughter who also provides collateral information. Records and images were personally reviewed where available.  HISTORY OF PRESENT ILLNESS: This is a 75 year old left-handed woman with a remote history of breast cancer, osteoporosis, presenting for evaluation of cognitive changes with atypical features. Her daughter started noticing changes around a year ago, her mother's friend called her to report that the patient got lost on the way to church. At that time, her daughter noticed cognitive to "spiral down," her mood changed, she tired easily. She saw an alternative medicine practitioner and was "misdiagnosed with Lyme disease and lead poisoning." She kept getting worse and did not want to eat, she could not hold a conversation, just looking down at the ground and unable to formulate her thoughts. She would finally answer after asked 5-6 times. Her daughter would bring her food and she would argue with her, picking at little things, with no appetite. At some point, retesting was done and she was found to have negative Lyme and lead testing. She started seeing a psychiatrist and took Zoloft for 3-4 months. Her appetite improved and she started resting better. She is eating better now and has gained weight. Her daughter has not noticed the significant communication problems any more. She saw her new PCP Dr. Mariea Clonts and reported that "things are better but not perfect." She continues to have word-finding difficulties, she wants to tell a story she  heard but "could not pull it out of her brain." Her daughter notices this around 1-2 times a week when they are together. Her daughter is concerned that she has hand-eye coordination problems, she cannot seem to calculate distances, hurting herself and bumping into things frequently. She has slammed the car door several times on her left leg, it seems like she cannot calculate timing as to how far her body is when she closes the door. She lives alone and has gotten a couple of notices of missed bill payments last month. She only takes supplements and remembers to take them regularly. She drives and has always had a poor sense of direction, denies getting lost when using her GPS. No difficulties with ADLs. No hygiene concerns from her daughter. No paranoia or hallucinations. No family history of memory loss. She denies any history of significant head injuries, no alcohol use. 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   She has occasional tingling and cold sensation on the middle finger of her right hand. She has rare urinary incontinence. She denies any headaches, dizziness, diplopia, dysarthria, dysphagia, neck/back pain, focal numbness/tingling/weakness, bowel dysfunction, no anosmia. She has tremors when anxious, left greater than right.  I personally reviewed MRI brain without contrast done 09/29/15 which did not show any acute changes. There was mild to moderate diffuse atrophy, with moderate ventriculomegaly seen, favored to represent central atrophy. No transependymal flow seen. 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  Laboratory Data: Lab Results  Component Value Date   WBC 5.9 09/14/2015   HGB 13.4 09/14/2015   HCT 39.6 09/14/2015   MCV 91.5 09/14/2015   PLT 186 09/14/2015     Chemistry       Component Value Date/Time   NA 139 09/14/2015 1051   NA 139 04/13/2015   K 4.4 09/14/2015 1051   CL 101 09/14/2015 1051   CO2 31 09/14/2015 1051   BUN 13 09/14/2015 1051   BUN 19 04/13/2015   CREATININE 0.63 09/14/2015 1051   GLU 92 04/13/2015      Component Value Date/Time   CALCIUM 9.6 09/14/2015 1051   ALKPHOS 76 09/14/2015 1051   AST 23 09/14/2015 1051   ALT 13 09/14/2015 1051   BILITOT 0.8 09/14/2015 1051     Lab Results  Component Value Date   TSH 3.16 04/13/2015   Lab Results  Component Value Date   [id number] 09/14/2015    PAST MEDICAL HISTORY: Past Medical History:  Diagnosis Date  . Cancer ([place])   . Osteoporosis     PAST SURGICAL HISTORY: Past Surgical History:  Procedure Laterality Date  . BREAST SURGERY    . FOOT SURGERY    . HYSTERECTOMY ABDOMINAL WITH SALPINGECTOMY  2010  . MASTECTOMY    . TONSILLECTOMY AND ADENOIDECTOMY      MEDICATIONS: Current Outpatient Prescriptions on File Prior to Visit  Medication Sig Dispense Refill  . b complex vitamins tablet Take 1 tablet by mouth daily.    . Cholecalciferol (VITAMIN D3) 5000 units CAPS Take 1 capsule by mouth daily.    . Nutritional Supplements (JUICE PLUS FIBRE PO) Take 1 tablet by mouth daily.    . TURMERIC PO Take 1 tablet by mouth daily.     No current facility-administered medications on file prior to visit.     ALLERGIES: No Known Allergies  FAMILY HISTORY: Family History  Problem Relation Age of Onset  . Adopted: Yes    SOCIAL HISTORY: Social History   Social History  . Marital status: Widowed    Spouse name: N/A  . Number of children: N/A  . Years of education: N/A   Occupational History  . Not on file.   Social History Main Topics  . Smoking status: Never Smoker  . Smokeless tobacco: Never Used  . Alcohol use No  . Drug use: No  . Sexual activity: Not on file   Other Topics Concern  . Not on file   Social History Narrative  . No narrative on file     REVIEW OF SYSTEMS: Constitutional: No fevers, chills, or sweats, no generalized fatigue, change in appetite Eyes: No visual changes, double vision, eye pain Ear, nose and throat: No hearing loss, ear pain, nasal congestion, sore throat Cardiovascular: No chest pain, palpitations Respiratory:  No shortness of breath at rest or with exertion, wheezes GastrointestinaI: No nausea, vomiting, diarrhea, abdominal pain, fecal incontinence Genitourinary:  No dysuria, urinary retention or frequency Musculoskeletal:  No neck pain, back pain Integumentary: No rash, pruritus, skin lesions Neurological: as above Psychiatric: No depression, insomnia, anxiety Endocrine: No palpitations, fatigue, diaphoresis, mood swings, change in appetite, change in weight, increased thirst Hematologic/Lymphatic:  No anemia, purpura, petechiae. Allergic/Immunologic: no itchy/runny eyes, nasal congestion, recent allergic reactions, rashes  PHYSICAL EXAM: Vitals:   12/04/15 1407  BP: 128/66  Pulse: 93   General: No acute distress Head:  Normocephalic/atraumatic Eyes: Fundoscopic exam shows bilateral sharp discs, no vessel  changes, exudates, or hemorrhages Neck: supple, no paraspinal tenderness, full range of motion Back: No paraspinal tenderness Heart: regular rate and rhythm Lungs: Clear to auscultation bilaterally. Vascular: No carotid bruits. Skin/Extremities: No rash, no edema Neurological Exam: Mental status: alert and oriented to person, place, and time. She is noted to have word-finding difficulties. No dysarthria or aphasia, Fund of knowledge is appropriate.  Recent and remote memory are intact.  Attention and concentration are normal.    Able to name objects and repeat phrases.  Montreal Cognitive Assessment  12/04/2015  Visuospatial/ Executive (0/5) 4  Naming (0/3) 3  Attention: Read list of digits (0/2) 2  Attention: Read list of letters (0/1) 1  Attention: Serial 7 subtraction starting at 100  (0/3) 3  Language: Repeat phrase (0/2) 2  Language : Fluency (0/1) 1  Abstraction (0/2) 2  Delayed Recall (0/5) 4  Orientation (0/6) 6  Total 28   Cranial nerves: CN I: not tested CN II: pupils equal, round and reactive to light, visual fields intact, fundi unremarkable. CN III, IV, VI:  full range of motion, no nystagmus, no ptosis CN V: facial sensation intact CN VII: upper and lower face symmetric CN VIII: hearing intact to finger rub CN IX, X: gag intact, uvula midline CN XI: sternocleidomastoid and trapezius muscles intact CN XII: tongue midline Bulk & Tone: normal, no cogwheeling, no fasciculations. Motor: 5/5 throughout with no pronator drift. Sensation: decreased cold on the right calf, decreased vibration to left ankle, otherwise intact on all modalities on both UE, intact pin on both LE.  No extinction to double simultaneous stimulation.  Romberg test negative Deep Tendon Reflexes: brisk +2 throughout, no ankle clonus Plantar responses: downgoing bilaterally Cerebellar: no incoordination on finger to nose, heel to shin. No dysdiadochokinesia. Gait: narrow-based and steady, able to tandem walk adequately. Tremor: mild endpoint tremor L>R, no resting tremor  IMPRESSION: This is a 75 year old left-handed woman with a remote history of breast cancer, osteoporosis, presenting for evaluation of word-finding difficulties. She was noted to have coordination issues at her PCP visit 2 months ago. Her neurological exam today is largely non-focal, she is able to do coordination testing without significant difficulties today. Her MOCA score is normal 28/30, with note of word-finding difficulties. No parkinsonian signs on exam. Her daughter continues to express concern that she does not seem to have coordination particularly with her left leg. Her MRI brain does not show any significant abnormalities, this was reviewed with them today. She will be scheduled for Neuropsychological evaluation  to further delineate her symptoms. There was an improvement after she started seeing Psychiatry, we discussed effects of mood on memory. We will continue to monitor motor symptoms for now. She will follow-up after Neuropsychological evaluation.   Thank you for allowing me to participate in the care of this patient. Please do not hesitate to call for any questions or concerns.   Yolanda Robbins, M.D.  CC: Dr. Mariea Clonts

## 2015-12-21 ENCOUNTER — Ambulatory Visit (INDEPENDENT_AMBULATORY_CARE_PROVIDER_SITE_OTHER): Payer: Medicare PPO | Admitting: Psychology

## 2015-12-21 ENCOUNTER — Encounter: Payer: Self-pay | Admitting: Psychology

## 2015-12-21 DIAGNOSIS — R4189 Other symptoms and signs involving cognitive functions and awareness: Secondary | ICD-10-CM | POA: Diagnosis not present

## 2015-12-21 DIAGNOSIS — R413 Other amnesia: Secondary | ICD-10-CM

## 2015-12-21 DIAGNOSIS — F411 Generalized anxiety disorder: Secondary | ICD-10-CM

## 2015-12-21 DIAGNOSIS — F605 Obsessive-compulsive personality disorder: Secondary | ICD-10-CM

## 2015-12-21 DIAGNOSIS — R4789 Other speech disturbances: Secondary | ICD-10-CM

## 2015-12-21 NOTE — Progress Notes (Signed)
NEUROPSYCHOLOGICAL INTERVIEW (CPT: [id number])  Name: Yolanda Robbins (goes by Opal Sidles) Date of Birth: 1940/09/28 Date of Interview: 12/21/2015  Reason for Referral:  KHALISE BIZZLE is a 75 y.o., left-handed female who is referred for neuropsychological evaluation by Dr. Ellouise Newer of [facility] due to concerns about word finding difficulty. This patient is accompanied in the office by her daughter, Ishmael Holter, who supplements the history.  History of Presenting Problem:  Mrs. Brix's daughter reported gradual onset of cognitive difficulty approximately a year ago. Not long after, the patient saw an alternative medicine practitioner and was reportedly misdiagnosed with Lyme disease and lead poisoning. She was prescribed medication and also did two rounds of chelation therapy. She continued to decline and also had a very poor appetite and was refusing to eat. Her daughter felt she was depressed. She could not communicate properly, was often staring at the ground and was slow to respond. The patient agreed to see a psychiatrist and was started on Sertraline. She took this for 3 months, and her daughter felt it improved her anxiety level and she seemed less interactive and more able to do things. However, the patient does not like taking medications and requested to come off it after 3 months. At some point, retesting was done with a primary care physician and she was found to have negative Lyme and lead testing. At the present time, she reports ongoing word finding difficulty. Her daughter is much more concerned about the patient's cognitive functioning and mood, though.   Upon direct questioning, the patient and her daughter reported the following:   Forgetting recent conversations/events: Heidi initially reported that the patient has more difficulty with long-term memory (recalling memories from long ago or well-rehearsed information, like family pets). Later Heidi says she has difficulty retelling  stories she has recently heard because she cannot recall major details or parts of the story.  Repeating statements/questions: No Misplacing/losing items: No, but admits she is not as organized as she should be (this is longstanding but may be getting worse). And when she does lose something, her reaction to it is more dramatic, per her daughter. Forgetting appointments or other obligations: No (uses a calendar) Forgetting to take medications: She does not take any prescription medications, but she has no problems remembering to take her daily vitamins/supplements  Difficulty concentrating: Yes Starting but not finishing tasks: Yes, a major issue Distracted easily: Yes, a major issue Processing information more slowly: The patient is unsure, but she notes it takes her longer to complete tasks than it used to.  Word-finding difficulty: Yes (people's names and other words).  Writing difficulty: No  Spelling difficulty: No Comprehension difficulty: The patient denies reading comprehension difficulty. Heidi notes that the patient sometimes "misses things" or doesn't respond to a question appropriately because she didn't seem to understand it correctly.  Getting lost when driving: "I am very directionally challenged" - this apparently has always been the case. She uses her GPS most of the time. Heidi does not feel there has been a change in her navigation abilities, but she reports concerns about the patient's visual-spatial skills, reasoning and judgment when driving. She cannot tell which turn lane to be in, for example. On one occasion, she did a U-Turn in the far right turn lane (turning in front of another car). She seems to have less awareness of the cars around her. She has not had any MVAs, fortunately. Heidi feels like the patient is distracted by things in her mind  while she is driving.   History of psychiatric treatment, aside from brief course of Sertraline earlier this year, was denied.  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 She reported the patient has always been very rigid and rule-bound, with significant stress if rules/plans have to be broken. She is very specific and strict about multiple personal regimens including diet/nutrition. She also has a history of hoarding but not to the point of affecting hygiene or home cleanliness. The patient admits that she feels anxious if she does not accomplish all she wanted to, or if she feels overwhelmed by things. Her daughter has noticed that she is much more quick-tempered than ever before.   The patient denied history of hallucinations. She denied past or present suicidal ideation or intention.  Physically, the patient and her daughter report reduced coordination abilities. Her neurologic exam with Dr. Delice Lesch on 12/04/2015 was "largely non-focal, she is able to do coordination testing without significant difficulties". Her MOCA was 28/30. There were no parkinsonian signs on exam. Her brain MRI did not show any significant abnormalities. Specifically, "MRI brain without contrast done 09/29/15 did not show any acute changes. There was mild to moderate diffuse atrophy, with moderate ventriculomegaly seen, favored to represent central atrophy. 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 However, her daughter reports significantly reduced balance when she is exercising.   She has no history of head injury or LOC.   There is no known family history of dementia but the patient was adopted.    Current Functioning: The patient lives alone in her own home. She manages most instrumental  ADLs independently, including driving, medications/vitamins, appointments and cooking/meal preparation. She manages her personal finances but her daughter helps with online bill-pay.    The patient leads a very active lifestyle and is involved with her church, friends and exercise.  She denies any physical concerns aside from being sick with a virus this past week.   With regard to mood, she denies depression but her daughter and her best friend feel that she may be depressed. The patient admits she feels anxious at times. She is very anxious about this neuropsychological evaluation. She is very fearful of developing diseases that will shorten her life, and she is very fearful of Alzheimer's disease. She denies sleep difficulty. When asked about her appetite, she states, "I'm eating". Her daughter is concerned about her rigid diet of mainly vegetables. She worries that the patient is not getting enough fuel for the level of activity she is doing. She does not eat very much fat or carbohydrates at all. It took her about 8 months to gain 10 lbs she had lost earlier this year when she was sick.    Social History: Born/Raised: Born in [place], adopted and raised in [place] Education: 2 years of college Occupational history: Dental office prior to retiring at age 82 Marital history: Widowed since 1998 Children: 1 daughter Alcohol/Tobacco/Substances: No alcohol, never a smoker, no hx of substance abuse   Medical History: Past Medical History:  Diagnosis Date  . Cancer ([place])   . Osteoporosis    Cancer was in 1990s; no recurrence.   Current Medications:  Outpatient Encounter Prescriptions as  of 12/21/2015  Medication Sig  . b complex vitamins tablet Take 1 tablet by mouth daily.  . Calcium Carb-Cholecalciferol (CALCIUM 1000 + D PO) Take by mouth.  . Cholecalciferol (VITAMIN D3) 5000 units CAPS Take 1 capsule by mouth daily.  . Magnesium 400 MG CAPS Take by mouth.  . Multiple Vitamins-Minerals (ZINC  PO) Take by mouth.  . Nutritional Supplements (JUICE PLUS FIBRE PO) Take 1 tablet by mouth daily.  . TURMERIC PO Take 1 tablet by mouth daily.  [name] VITAMIN K PO Take by mouth.   No facility-administered encounter medications on file as of 12/21/2015.      Behavioral Observations:   Appearance: Neatly and appropriately dressed and groomed, very thin appearing Gait: Ambulated independently, no abnormalities observed Speech: Fluent; normal rate, rhythm and volume. Increased response latencies. Mild word finding difficulty.  Some reduced comprehension of questions asked of her. Thought process: Generally linear Affect: Blunted, anxious Interpersonal: Pleasant, appropriate   TESTING: There is medical necessity to proceed with neuropsychological assessment as the results will be used to aid in differential diagnosis and clinical decision-making and to inform specific treatment recommendations. Per the patient, her daughter and medical records reviewed, there has been a change in cognitive functioning and a reasonable suspicion of dementia. There is a need for objective testing of reported cognitive changes in order to determine neurologic versus psychogenic (eg pseudodementia, anxiety, depression) etiology of symptoms.    PLAN: The patient will return for a full battery of neuropsychological testing with a psychometrician under my supervision. Education regarding testing procedures was provided. Subsequently, the patient will see this provider for a follow-up session at which time her test performances and my impressions and treatment recommendations will be reviewed in detail.   Full neuropsychological evaluation report to follow.

## 2015-12-30 ENCOUNTER — Ambulatory Visit (INDEPENDENT_AMBULATORY_CARE_PROVIDER_SITE_OTHER): Payer: Medicare PPO | Admitting: Psychology

## 2015-12-30 DIAGNOSIS — R4789 Other speech disturbances: Secondary | ICD-10-CM | POA: Diagnosis not present

## 2015-12-30 DIAGNOSIS — R413 Other amnesia: Secondary | ICD-10-CM

## 2015-12-30 NOTE — Progress Notes (Signed)
   Neuropsychology Note  JESSAH LUYSTER returned today for 3 hours of neuropsychological testing with technician, Milana Kidney, BS, under the supervision of Dr. Macarthur Critchley. The patient did not appear overtly distressed by the testing session, per behavioral observation or via self-report to the technician. Rest breaks were offered. Yolanda Robbins will return within 2 weeks for a feedback session with Dr. Si Raider at which time her test performances, clinical impressions and treatment recommendations will be reviewed in detail. The patient understands she can contact our office should she require our assistance before this time.  Full report to follow.

## 2016-01-10 NOTE — Progress Notes (Signed)
NEUROPSYCHOLOGICAL EVALUATION   Name:    Yolanda Robbins  Date of Birth:   1940-02-17 Date of Interview:  12/21/2015 Date of Testing:  12/30/2015   Date of Feedback:  01/11/2016       Background Information:  Reason for Referral:  Yolanda Robbins (goes by Yolanda Robbins) is a 75 y.o. female referred by Dr. Ellouise Newer to assess her current level of cognitive functioning and assist in differential diagnosis. The current evaluation consisted of a review of available medical records, an interview with the patient and her daughter, Yolanda Robbins, and the completion of a neuropsychological testing battery. Informed consent was obtained.  History of Presenting Problem:  Yolanda Robbins's daughter reported gradual onset of cognitive difficulty approximately a year ago. Not long after, the patient saw an alternative medicine practitioner and was reportedly misdiagnosed with Lyme disease and lead poisoning. She was prescribed medication and also did two rounds of chelation therapy. She continued to decline and also had a very poor appetite and was refusing to eat. Her daughter felt she was depressed. She could not communicate properly, was often staring at the ground and was slow to respond. The patient agreed to see a psychiatrist and was started on Sertraline. She took this for 3 months, and her daughter felt it improved her anxiety level and she seemed more interactive and more able to do things. However, the patient does not like taking medications and requested to come off it after 3 months. At some point, retesting was done with a primary care physician and she was found to have negative Lyme and lead testing. At the present time, she reports ongoing word finding difficulty. Her daughter is much more concerned about the patient's cognitive functioning and mood, though.   Upon direct questioning, the patient and her daughter reported the following:   Forgetting recent conversations/events: Yolanda Robbins initially  reported that the patient has more difficulty with long-term memory (recalling memories from long ago or well-rehearsed information, like family pets). Later Yolanda Robbins says she has difficulty retelling stories she has recently heard because she cannot recall major details or parts of the story.  Repeating statements/questions: No Misplacing/losing items: No, but admits she is not as organized as she should be (this is longstanding but may be getting worse). And when she does lose something, her reaction to it is more dramatic, per her daughter. Forgetting appointments or other obligations: No (uses a calendar) Forgetting to take medications: She does not take any prescription medications, but she has no problems remembering to take her daily vitamins/supplements  Difficulty concentrating: Yes Starting but not finishing tasks: Yes, a major issue Distracted easily: Yes, a major issue Processing information more slowly: The patient is unsure, but she notes it takes her longer to complete tasks than it used to.  Word-finding difficulty: Yes (people's names and other words).  Writing difficulty: No  Spelling difficulty: No Comprehension difficulty: The patient denies reading comprehension difficulty. Yolanda Robbins notes that the patient sometimes "misses things" or doesn't respond to a question appropriately because she didn't seem to understand it correctly.  Getting lost when driving: "I am very directionally challenged" - this apparently has always been the case. She uses her GPS most of the time. Yolanda Robbins does not feel there has been a change in her navigation abilities, but she reports concerns about the patient's visual-spatial skills, reasoning and judgment when driving. She cannot tell which turn lane to be in, for example. On one occasion, she did a U-Turn in  the far right turn lane (turning in front of another car). She seems to have less awareness of the cars around her. She has not had any MVAs,  fortunately. Yolanda Robbins feels like the patient is distracted by thoughts while she is driving.   History of psychiatric treatment, aside from brief course of Sertraline earlier this year, was denied. 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 She reported the patient has always been very rigid and rule-bound, with significant stress if rules/plans have to be broken. She is very specific and strict about multiple personal regimens including diet/nutrition. She also has a history of hoarding but not to the point of affecting hygiene or home cleanliness/safety. The patient admits that she feels anxious if she does not accomplish all she wanted to, or if she feels overwhelmed by things. Her daughter has noticed that she is much more quick-tempered than ever before.   The patient denied history of hallucinations. She denied past or present suicidal ideation or intention.  Physically, the patient and her daughter report reduced coordination abilities. Her neurologic exam with Dr. Delice Lesch on 12/04/2015 was "largely non-focal, she is able to do coordination testing without significant difficulties". Her MOCA was 28/30. There were no parkinsonian signs on exam. Her brain MRI did not show any significant abnormalities. Specifically, "MRI brain without contrast done 09/29/15 did not show any acute changes. There was mild to moderate diffuse atrophy, with moderate ventriculomegaly seen, favored to represent central atrophy. 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 However, her daughter reports significantly reduced balance when she is exercising.   She has  no history of head injury or LOC.   There is no known family history of dementia but the patient was adopted.    Current Functioning: The patient lives alone in her own home. She manages most instrumental ADLs independently, including driving, medications/vitamins, appointments and cooking/meal preparation. She manages her personal finances but her daughter helps with online bill-pay.    The patient leads a very active lifestyle and is involved with her church, friends and exercise.  She denies any physical concerns aside from being sick with a virus this past week.   With regard to mood, she denies depression but her daughter and her best friend feel that she may be depressed. The patient admits she feels anxious at times. She is very anxious about this neuropsychological evaluation. She is very fearful of developing diseases that will shorten her life, and she is very fearful of Alzheimer's disease. She denies sleep difficulty. When asked about her appetite, she states, "I'm eating". Her daughter is concerned about her rigid diet of mainly vegetables. She worries that the patient is not getting enough fuel for the level of activity she is doing. She does not eat very much fat or carbohydrates at all. It took her about 8 months to gain 10 lbs she had lost earlier this year when she was sick.    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 Cancer ([place])   . Osteoporosis    Cancer was in 1990s; no recurrence.   Current medications:  Outpatient Encounter Prescriptions as of 01/11/2016  Medication Sig  . b complex vitamins tablet Take 1 tablet by mouth  daily.  . Calcium Carb-Cholecalciferol (CALCIUM 1000 + D PO) Take by mouth.  . Cholecalciferol (VITAMIN D3) 5000 units CAPS Take 1 capsule by mouth daily.  . Magnesium 400 MG CAPS Take by mouth.  . Multiple Vitamins-Minerals (ZINC PO) Take by mouth.  . Nutritional Supplements (JUICE PLUS FIBRE PO) Take 1 tablet by mouth daily.  . TURMERIC PO Take 1 tablet by mouth daily.  [name] VITAMIN K PO Take by mouth.   No facility-administered encounter medications on file as of 01/11/2016.      Current Examination:  Behavioral Observations:  Appearance: Neatly and appropriately dressed and groomed, very thin appearing Gait: Ambulated independently, no abnormalities observed Speech: Fluent; normal rate, rhythm and volume. Increased response latencies. Mild word finding difficulty.  Some reduced comprehension of questions asked of her. Thought process: Generally linear Affect: Blunted, anxious. Increased anxiety and stress observed during testing session. Interpersonal: Pleasant, appropriate Orientation: Oriented to all spheres. Accurately named the current President and his predecessor.  Tests Administered: . Test of Premorbid Functioning (TOPF) . Wechsler Adult Intelligence Scale-Fourth Edition (WAIS-IV): Similarities, Block Design, Matrix Reasoning, and Digit Span subtests . [employer] Verbal Learning Test - 2nd Edition (CVLT-2) Short Form . Repeatable Battery for the Assessment of Neuropsychological Status (RBANS) Form A:  Figure Copy and Recall subtests, Story Memory and Recall subtests and Coding Subtest . Neuropsychological Assessment Battery (NAB) Language Module, Form 1: Auditory Comprehension, Naming and Bill Payment Subtests . Symbol Digit Modalities Test (SDMT) . Controlled Oral Word Association Test (COWAT) . Trail Making Test A and B . Clock drawing test . [employer] [facility]) . Geriatric Depression Scale (GDS) 15 Item . Generalized Anxiety Disorder - 7 item screener  (GAD-7) . Beck Anxiety Inventory (BAI)  Test Results: Note: Standardized scores are presented only for use by appropriately trained professionals and to allow for any future test-retest comparison. These scores should not be interpreted without consideration of all the information that is contained in the rest of the report. The most recent standardization samples from the test publisher or other sources were used whenever possible to derive standard scores; scores were corrected for age, gender, ethnicity and education when available.   Test Scores:  Test Name Raw Score Standardized Score Descriptor  TOPF 29/70 SS= 91 Average  WAIS-IV Subtests     Similarities 25/36 ss= 11 Average  Block Design 20/66 ss= 8 Average  Matrix Reasoning 8/26 ss= 8 Average  Digit Span Forward 9/16 ss= 9 Average  Digit Span Backward 6/16 ss= 8 Average  RBANS Subtests     Figure Copy 19/20 Z= 0.7 High average  Figure Recall 6/20 Z= -1.6 Borderline  Story Memory 14/24 Z= -0.9 Low average  Story Recall 6/12 Z= -1.4 Borderline  Coding 31/89 Z= -1.1 Low average  CVLT-II Scores     Trial 1 5/9 Z= -0.5 Average  Trial 4 6/9 Z= -1.5 Borderline  Trials 1-4 total 24/36 T= 45 Average  SD Free Recall 5/9 Z= -1 Low average  LD Free Recall 5/9 Z= -0.5 Average  LD Cued Recall 5/9 Z= -1 Low average  Recognition Discriminability 8/9 hits, 0 false positives Z= 0  Average  Forced Choice Recognition 8/9  Abnormal  NAB Language Subtests     Auditory Comprehension 89/89 T= 58 High average  Naming 31/31 T= 60 High average  Bill Payment 19/19 T= 55 Average  SDMT     Oral 30/110 Z= -1.7 Borderline  COWAT-FAS 30 T= 40 Low average  COWAT-Animals 20 T= 54 Average  Trail Making Test A 45" 0 errors T= 51 Average  Trail Making Test B 131" 0 errors T= 48 Average  Clock Drawing   WNL   WCST     Total Errors 32 T= 39 Low average  Perseverative Responses 22 T= 43 Average  Perseverative Errors 20 T= 41 Low average  Conceptual  Level Responses 25 T= 42 Low average  Categories Completed 1 11-16% Below average  Trials to Complete 1st Category 38 11-16% Below average  Failure to Maintain Set 0  WNL  GDS-15 0/15  WNL   GAD-7 1/21  WNL   BAI 3/63  WNL      Description of Test Results:  Premorbid verbal intellectual abilities were estimated to have been within the average range based on a test of word reading. Psychomotor processing speed was low average. On another test of processing speed, this time without a motor component, she performed within the borderline impaired range. Auditory attention and working memory were average. Visual-spatial construction was average to high average. Language abilities were intact. Specifically, confrontation naming was high average with 100% accuracy, and semantic verbal fluency was average. Auditory comprehension was high average. On a simulated bill payment task requiring multiple aspects of both expressive and receptive language, she performed within the average range with 100% accuracy. With regard to verbal memory, encoding and acquisition of non-contextual information (i.e., word list) was average across four learning trials. After a brief distracter task, free recall was low average (5/9 items recalled). After a delay, free recall was average (5/9 items recalled, demonstrating good retention of previously encoded information). Recall did not benefit from semantic cueing. Performance on a yes/no recognition task was average. On another verbal memory test, encoding and acquisition of contextual auditory information (i.e., short story) was low average across two learning trials. After a delay, free recall was borderline impaired. With regard to non-verbal memory, delayed free recall of visual information was borderline impaired. Executive functioning was variable. Mental flexibility and set-shifting were average on Trails B. Verbal fluency with phonemic search restrictions was low average.  Verbal abstract reasoning was average. Non-verbal abstract reasoning was average. Deductive reasoning and problem solving were below expectation; she had difficulty identifying the initial rule on a card sorting task and after she did, she was unable to successfully change to a different rule. Performance on a clock drawing task was within normal limits. On self-report questionnaires, the patient's responses were not  indicative of clinically significant depression or anxiety at the present time, which is inconsistent with her clinical presentation and history provided which indicate significant anxiety.    Clinical Impressions: Mild cognitive impairment (quite possibly secondary to anxiety); Unspecified anxiety disorder; Obsessive-compulsive personality features. Cognitive testing was largely within normal limits. She did demonstrate reduced processing speed along with variable difficulty with encoding/retrieval. This could be secondary to anxiety. I feel the patient's anxiety is the biggest contributor to distress and reduced functioning, although the patient has little insight into it. 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 Fortunately there is no sign of underlying dementia including Alzheimer's disease or PPA at the present time. I am hopeful that with effective treatment of anxiety, her cognitive symptoms will improve.     Recommendations/Plan: Based on the findings of the present evaluation, the following recommendations are offered:  1. Treatment for anxiety is indicated. I doubt psychotherapy would be helpful since the patient has low insight. Sertraline was helpful in the past, per her daughter, and I would recommend consideration of re-initiating this  medication.  2. If a decline in cognitive functioning is observed despite treatment of anxiety, neuropsychological re-evaluation can be performed in the future and compared to the current results. 3. The patient will be reassured that there are no signs of dementia, including Alzheimer's disease and PPA, at the present time.   Feedback to Patient: Yolanda Robbins returned for a feedback appointment on 01/11/2016 to review the results of her neuropsychological evaluation with this provider. 30 minutes face-to-face time was spent reviewing her test results, my impressions and my recommendations as detailed above.    Total time spent on this patient's case: [id number] unit for interview with psychologist; [phone] units of testing by psychometrician under psychologist's supervision; [phone] units for medical record review, scoring of neuropsychological tests, interpretation of test results, preparation of this report, and review of results to the patient by psychologist.      Thank you for your referral of NASIYA ELVIR. Please feel free to contact me if you have any questions or concerns regarding this report.

## 2016-01-11 ENCOUNTER — Encounter: Payer: Self-pay | Admitting: Psychology

## 2016-01-11 ENCOUNTER — Ambulatory Visit (INDEPENDENT_AMBULATORY_CARE_PROVIDER_SITE_OTHER): Payer: Medicare PPO | Admitting: Psychology

## 2016-01-11 DIAGNOSIS — R413 Other amnesia: Secondary | ICD-10-CM

## 2016-01-11 DIAGNOSIS — F605 Obsessive-compulsive personality disorder: Secondary | ICD-10-CM

## 2016-01-11 HISTORY — DX: Obsessive-compulsive personality disorder: F60.5

## 2016-01-11 NOTE — Patient Instructions (Signed)
Fortunately, results of cognitive testing were within normal limits and NOT indicative of dementia or underlying Alzheimer's disease at this time.  I do feel that you are experiencing significant anxiety and perfectionism, which is probably contributing to the cognitive issues you have noticed.  Below is some more information on this. I am hopeful that treatment of anxiety via medication will not only improve your mood and coping, but also result in improved cognitive functioning in your daily life.    The effect of depression and anxiety on your cognitive functioning: . One of the typical symptoms of depression is difficulty concentrating and making decisions, and various types of anxiety also interfere with attention and concentration . Problems with attention and concentration can disrupt the process of learning and making new memories, which can make it seem like there is a problem with your memory. In your daily life, you may experience this disruption as forgetting names and appointments, misplacing items, and needing to make lists for shopping and errands. It may be harder for you to stay focused on tasks and feel as "sharp" as you did in the past.  . Also, when we are depressed or anxious, we often pay more attention to our difficulties (rather than our strengths) in our daily life, and this can make it seem to [place] like we are doing worse cognitively than we really are. . The cognitive aspects of depression and anxiety are sometimes observed as an identifiable pattern of poor performance on a neuropsychological evaluation, but it is also possible that all scores on an evaluation are within normal limits. . Regardless of the test scores, distress related to depression and anxiety can interfere with the ability to make use of your cognitive resources and function optimally across settings such as work or school, maintaining the home and responsibilities, and personal relationships. . Fortunately,  there are treatments for depression and anxiety, and when mood improves, cognitive functioning in daily life often improves. . Treatment options include psychotherapy, medications (e.g., antidepressants), and behavioral changes, such as increasing your involvement in enjoyable activities, increasing the amount of exercise you are getting, and maintaining a regular routine.

## 2016-01-18 ENCOUNTER — Ambulatory Visit (INDEPENDENT_AMBULATORY_CARE_PROVIDER_SITE_OTHER): Payer: Medicare PPO

## 2016-01-18 VITALS — BP 140/80 | HR 70 | Temp 97.9°F | Ht 68.0 in | Wt 139.2 lb

## 2016-01-18 DIAGNOSIS — Z0000 Encounter for general adult medical examination without abnormal findings: Secondary | ICD-10-CM

## 2016-01-18 NOTE — Patient Instructions (Signed)
Yolanda Robbins , Thank you for taking time to come for your Medicare Wellness Visit. I appreciate your ongoing commitment to your health goals. Please review the following plan we discussed and let me know if I can assist you in the future.   These are the goals we discussed: Goals    . <enter goal here>          Starting 01/18/16, I will maintain my current exercise routine and I will attempt to work on my posture.        This is a list of the screening recommended for you and due dates:  Health Maintenance  Topic Date Due  . Shingles Vaccine  05/02/2000  . Flu Shot  04/30/2016*  . Colon Cancer Screening  11/24/2020  . Tetanus Vaccine  01/31/2022  . DEXA scan (bone density measurement)  Completed  . Pneumonia vaccines  Completed  *Topic was postponed. The date shown is not the original due date.  Preventive Care for Adults  A healthy lifestyle and preventive care can promote health and wellness. Preventive health guidelines for adults include the following key practices.  . A routine yearly physical is a good way to check with your health care provider about your health and preventive screening. It is a chance to share any concerns and updates on your health and to receive a thorough exam.  . Visit your dentist for a routine exam and preventive care every 6 months. Brush your teeth twice a day and floss once a day. Good oral hygiene prevents tooth decay and gum disease.  . The frequency of eye exams is based on your age, health, family medical history, use  of contact lenses, and other factors. Follow your health care provider's ecommendations for frequency of eye exams.  . Eat a healthy diet. Foods like vegetables, fruits, whole grains, low-fat dairy products, and lean protein foods contain the nutrients you need without too many calories. Decrease your intake of foods high in solid fats, added sugars, and salt. Eat the right amount of calories for you. Get information about a  proper diet from your health care provider, if necessary.  . Regular physical exercise is one of the most important things you can do for your health. Most adults should get at least 150 minutes of moderate-intensity exercise (any activity that increases your heart rate and causes you to sweat) each week. In addition, most adults need muscle-strengthening exercises on 2 or more days a week.  Silver Sneakers may be a benefit available to you. To determine eligibility, you may visit the website: www.silversneakers.com or contact program at (867)181-5328 Mon-Fri between 8AM-8PM.   . Maintain a healthy weight. The body mass index (BMI) is a screening tool to identify possible weight problems. It provides an estimate of body fat based on height and weight. Your health care provider can find your BMI and can help you achieve or maintain a healthy weight.   For adults 20 years and older: ? A BMI below 18.5 is considered underweight. ? A BMI of 18.5 to 24.9 is normal. ? A BMI of 25 to 29.9 is considered overweight. ? A BMI of 30 and above is considered obese.   . Maintain normal blood lipids and cholesterol levels by exercising and minimizing your intake of saturated fat. Eat a balanced diet with plenty of fruit and vegetables. Blood tests for lipids and cholesterol should begin at age 15 and be repeated every 5 years. If your lipid or cholesterol levels  are high, you are over 50, or you are at high risk for heart disease, you may need your cholesterol levels checked more frequently. Ongoing high lipid and cholesterol levels should be treated with medicines if diet and exercise are not working.  . If you smoke, find out from your health care provider how to quit. If you do not use tobacco, please do not start.  . If you choose to drink alcohol, please do not consume more than 2 drinks per day. One drink is considered to be 12 ounces (355 mL) of beer, 5 ounces (148 mL) of wine, or 1.5 ounces (44 mL) of  liquor.  . If you are 51-24 years old, ask your health care provider if you should take aspirin to prevent strokes.  . Use sunscreen. Apply sunscreen liberally and repeatedly throughout the day. You should seek shade when your shadow is shorter than you. Protect yourself by wearing long sleeves, pants, a wide-brimmed hat, and sunglasses year round, whenever you are outdoors.  . Once a month, do a whole body skin exam, using a mirror to look at the skin on your back. Tell your health care provider of new moles, moles that have irregular borders, moles that are larger than a pencil eraser, or moles that have changed in shape or color.

## 2016-01-18 NOTE — Progress Notes (Signed)
Subjective:   Yolanda Robbins is a 75 y.o. female who presents for Medicare Annual (Subsequent) preventive examination.  Review of Systems:   Cardiac Risk Factors include: advanced age (>64men, >64 women)     Objective:     Vitals: BP 140/80 (BP Location: Left Arm, Patient Position: Sitting, Cuff Size: Normal)   Pulse 70   Temp 97.9 F (36.6 C) (Oral)   Ht 5\' 8"  (1.727 m)   Wt 139 lb 3.2 oz (63.1 kg)   SpO2 97%   BMI 21.17 kg/m   Body mass index is 21.17 kg/m.   Tobacco History  Smoking Status  . Never Smoker  Smokeless Tobacco  . Never Used     Counseling given: No   Past Medical History:  Diagnosis Date  . Anxiety   . Cancer ([place])   . 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 Osteoporosis    Past Surgical History:  Procedure Laterality Date  . BREAST SURGERY    . FOOT SURGERY    . HYSTERECTOMY ABDOMINAL WITH SALPINGECTOMY  2010  . MASTECTOMY    . TONSILLECTOMY AND ADENOIDECTOMY     Family History  Problem Relation Age of Onset  . Adopted: Yes   History  Sexual Activity  . Sexual activity: No    Outpatient Encounter Prescriptions as of 01/18/2016  Medication Sig  . b complex vitamins tablet Take 1 tablet by mouth daily.  . Calcium Carb-Cholecalciferol (CALCIUM 1000 + D PO) Take by mouth.  . Cholecalciferol (VITAMIN D3) 5000 units CAPS Take 1 capsule by mouth daily.  . Magnesium 400 MG CAPS Take by mouth.  . Multiple Vitamins-Minerals (ZINC PO) Take by mouth.  . Nutritional Supplements (JUICE PLUS FIBRE PO) Take 1 tablet by mouth daily.  . TURMERIC PO Take 1 tablet by mouth daily.  [name] VITAMIN K PO Take by mouth.   No facility-administered encounter medications on file as of 01/18/2016.     Activities of Daily Living In your present state of health, do you have any difficulty performing the following activities: 01/18/2016  Hearing? N  Vision? Y  Difficulty concentrating or making decisions? Y  Walking or climbing stairs? N    Dressing or bathing? N  Doing errands, shopping? N  Preparing Food and eating ? N  Using the Toilet? N  In the past six months, have you accidently leaked urine? Y  Do you have problems with loss of bowel control? N  Managing your Medications? N  Managing your Finances? N  Housekeeping or managing your Housekeeping? N  Some recent data might be hidden    Patient Care Team: Gayland Curry, DO as PCP - General (Geriatric Medicine)    Assessment:    Exercise Activities and Dietary recommendations Current Exercise Habits: Structured exercise class, Type of exercise: walking;stretching, Time (Minutes): 45, Frequency (Times/Week): 5, Weekly Exercise (Minutes/Week): 225, Intensity: Moderate  Goals    . <enter goal here>          Starting 01/18/16, I will maintain my current exercise routine and I will attempt to work on my posture.       Fall Risk Fall Risk  01/18/2016 12/04/2015 09/21/2015 09/14/2015  Falls in the past year? Yes Yes No No  Number falls in past yr: 1 1 - -  Injury with Fall? No No - -  Follow up Falls prevention discussed - - -   Depression Screen PHQ 2/9 Scores 01/18/2016 09/21/2015 09/14/2015  PHQ - 2 Score 1  0 0     Cognitive Function MMSE - Mini Mental State Exam 09/21/2015  Orientation to time 5  Orientation to Place 4  Registration 3  Attention/ Calculation 5  Recall 2  Language- name 2 objects 2  Language- repeat 1  Language- follow 3 step command 2  Language- read & follow direction 1  Write a sentence 1  Copy design 1  Total score 27   Montreal Cognitive Assessment  12/04/2015  Visuospatial/ Executive (0/5) 4  Naming (0/3) 3  Attention: Read list of digits (0/2) 2  Attention: Read list of letters (0/1) 1  Attention: Serial 7 subtraction starting at 100 (0/3) 3  Language: Repeat phrase (0/2) 2  Language : Fluency (0/1) 1  Abstraction (0/2) 2  Delayed Recall (0/5) 4  Orientation (0/6) 6  Total 28      Immunization History  Administered  Date(s) Administered  . Pneumococcal Conjugate-13 01/31/2013  . Pneumococcal Polysaccharide-23 02/01/2007  . Tdap 02/01/2012   Screening Tests Health Maintenance  Topic Date Due  . ZOSTAVAX  05/02/2000  . INFLUENZA VACCINE  04/30/2016 (Originally 09/01/2015)  . COLONOSCOPY  11/24/2020  . TETANUS/TDAP  01/31/2022  . DEXA SCAN  Completed  . PNA vac Low Risk Adult  Completed      Plan:    I have personally reviewed and addressed the Medicare Annual Wellness questionnaire and have noted the following in the patient's chart:  A. Medical and social history B. Use of alcohol, tobacco or illicit drugs  C. Current medications and supplements D. Functional ability and status E.  Nutritional status F.  Physical activity G. Advance directives H. List of other physicians I.  Hospitalizations, surgeries, and ER visits in previous 12 months J.  [place] to include hearing, vision, cognitive, depression L. Referrals and appointments - none  In addition, I have reviewed and discussed with patient certain preventive protocols, quality metrics, and best practice recommendations. A written personalized care plan for preventive services as well as general preventive health recommendations were provided to patient.  See attached scanned questionnaire for additional information.   Signed,   Allyn Kenner, LPN Health Advisor     I reviewed health advisor's note, was available for consultation and agree with the assessment and plan as written.  I reviewed the neuropsych evaluation which indicates mild cognitive impairment and obsessive compulsive personality, definitely anxiety that needs treatment--likely, we will resume zoloft and titrate to effective dose.    [name], D.O. [facility] [address], [place] [address] Cell Phone (Mon-Fri 8am-5pm):  [phone] On Call:  [phone] & follow prompts after 5pm &  weekends Office Phone:  [phone] Office Fax:  [phone]

## 2016-01-18 NOTE — Progress Notes (Signed)
Quick Notes   Health Maintenance:   Pt declined flu shot;    Abnormal Screen:  None; Last MMSE done 09/2015 and additional cognitive function test done 12/2015.     Patient Concerns:   Pt's daughter would like for you to review the Neuro Psych report prior to visit 02/05/2016. Pt was recently diagnosed with Anxiety and Obsessive Complusive Personality.    Nurse Concerns:  None

## 2016-01-19 ENCOUNTER — Encounter: Payer: Self-pay | Admitting: Internal Medicine

## 2016-01-29 ENCOUNTER — Ambulatory Visit: Payer: Medicare PPO

## 2016-02-05 ENCOUNTER — Ambulatory Visit: Payer: Medicare PPO

## 2016-02-05 ENCOUNTER — Ambulatory Visit (INDEPENDENT_AMBULATORY_CARE_PROVIDER_SITE_OTHER): Payer: Medicare HMO | Admitting: Internal Medicine

## 2016-02-05 ENCOUNTER — Encounter: Payer: Self-pay | Admitting: Internal Medicine

## 2016-02-05 VITALS — BP 120/70 | HR 75 | Temp 97.8°F | Ht 68.0 in | Wt 139.0 lb

## 2016-02-05 DIAGNOSIS — S8011XA Contusion of right lower leg, initial encounter: Secondary | ICD-10-CM

## 2016-02-05 DIAGNOSIS — Z0000 Encounter for general adult medical examination without abnormal findings: Secondary | ICD-10-CM | POA: Diagnosis not present

## 2016-02-05 DIAGNOSIS — F422 Mixed obsessional thoughts and acts: Secondary | ICD-10-CM

## 2016-02-05 MED ORDER — SERTRALINE HCL 25 MG PO TABS: 25.0000 mg | ORAL_TABLET | Freq: Every day | ORAL | 1 refills | Status: DC

## 2016-02-05 NOTE — Progress Notes (Signed)
Provider:  Rexene Edison. Mariea Clonts, D.O., C.M.D. Location:   [facility]   Place of Service:   clinic  Previous PCP: Hollace Kinnier, DO Patient Care Team: Gayland Curry, DO as PCP - General (Geriatric Medicine)  Extended Emergency Contact Information Primary Emergency Contact: Amash,Heidi Address: [address]          [place],  [address] [address] of [place] Phone: [phone] Mobile Phone: [phone] Relation: Daughter  Code Status: info given today and will discuss in the future Goals of Care: Advanced Directive information Advanced Directives 02/05/2016  Does Patient Have a Medical Advance Directive? No  Type of Advance Directive -  Copy of [place] in Chart? -  Would patient like information on creating a medical advance directive? Yes (ED - Information included in AVS)    Chief Complaint  Patient presents with  . Annual Exam    CPE    HPI: Patient is a 76 y.o. female seen today for an annual physical exam. She had her AWV with Alisa, LPN.    She has had her neuropsychological testing since her last visit and it was concluded that she likely has more difficulty with OCD and anxiety and hat her cognitive status is quite good.  She is wanting to discuss restarting zoloft therapy.  Declines flu shots chronically.   Has a place on her leg that looked bigger when she checked it this morning--it looks like a bruise.    MMSE - Mini Mental State Exam 01/18/2016 09/21/2015  Not completed: (No Data) -  Orientation to time - 5  Orientation to Place - 4  Registration - 3  Attention/ Calculation - 5  Recall - 2  Language- name 2 objects - 2  Language- repeat - 1  Language- follow 3 step command - 2  Language- read & follow direction - 1  Write a sentence - 1  Copy design - 1  Total score - 27    Past Medical History:  Diagnosis Date  . Anxiety   . Cancer ([place])   . 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 Osteoporosis    Past  Surgical History:  Procedure Laterality Date  . BREAST SURGERY    . FOOT SURGERY    . HYSTERECTOMY ABDOMINAL WITH SALPINGECTOMY  2010  . MASTECTOMY    . TONSILLECTOMY AND ADENOIDECTOMY      reports that she has never smoked. She has never used smokeless tobacco. She reports that she does not drink alcohol or use drugs.  Functional Status Survey:  independent in adls and iadls  Family History  Problem Relation Age of Onset  . Adopted: Yes    Health Maintenance  Topic Date Due  . ZOSTAVAX  05/02/2000  . COLONOSCOPY  11/24/2020  . TETANUS/TDAP  01/31/2022  . DEXA SCAN  Completed  . PNA vac Low Risk Adult  Completed    No Known Allergies  Allergies as of 02/05/2016   No Known Allergies     Medication List       Accurate as of 02/05/16  9:11 AM. Always use your most recent med list.          b complex vitamins tablet Take 1 tablet by mouth daily.   CALCIUM 1000 + D PO Take by mouth.   JUICE PLUS FIBRE PO Take 1 tablet by mouth daily.   Magnesium 400 MG Caps Take by mouth.   TURMERIC PO Take 1 tablet by mouth daily.   Vitamin  D3 5000 units Caps Take 1 capsule by mouth daily.   VITAMIN K PO Take by mouth.   ZINC PO Take by mouth.       Review of Systems  Constitutional: Negative for chills, fever and weight loss.  HENT: Negative for hearing loss.   Eyes: Negative for blurred vision.       Glasses to read  Respiratory: Negative for cough and shortness of breath.   Cardiovascular: Negative for chest pain, palpitations and leg swelling.       Has a finger that turns white sometimes and gets real cold, then reverses   Gastrointestinal: Negative for abdominal pain, blood in stool, constipation, heartburn and melena.  Genitourinary: Positive for urgency. Negative for dysuria and frequency.       If waits too long; has bladder prolapse  Musculoskeletal: Positive for falls. Negative for back pain, joint pain, myalgias and neck pain.       Fell at [employer] convention--tripped over a tree root under concrete when she turned to speak to someone else; having some contractures in both hands more in right hand  Skin: Negative for itching and rash.       Spot on leg  Neurological: Negative for dizziness, loss of consciousness and weakness.  Endo/Heme/Allergies: Does not bruise/bleed easily.  Psychiatric/Behavioral: Negative for depression, memory loss and suicidal ideas. The patient is nervous/anxious. The patient does not have insomnia.     Vitals:   02/05/16 0900  BP: 120/70  Pulse: 75  Temp: 97.8 F (36.6 C)  TempSrc: Oral  SpO2: 98%  Weight: 139 lb (63 kg)  Height: 5\' 8"  (1.727 m)   Body mass index is 21.13 kg/m. Physical Exam  Constitutional: She is oriented to person, place, and time. She appears well-developed and well-nourished. No distress.  Thin white female  HENT:  Head: Normocephalic and atraumatic.  Right Ear: External ear normal.  Left Ear: External ear normal.  Nose: Nose normal.  Mouth/Throat: Oropharynx is clear and moist. No oropharyngeal exudate.  Dry throat  Eyes: Conjunctivae and EOM are normal. Pupils are equal, round, and reactive to light.  Neck: Normal range of motion. Neck supple. No JVD present.  Cardiovascular: Normal rate, regular rhythm, normal heart sounds and intact distal pulses.   Pulmonary/Chest: Effort normal and breath sounds normal. No respiratory distress.  Abdominal: Soft. Bowel sounds are normal. She exhibits no distension. There is no tenderness.  Musculoskeletal: Normal range of motion. She exhibits no edema or tenderness.  Lymphadenopathy:    She has no cervical adenopathy.  Neurological: She is alert and oriented to person, place, and time. She displays normal reflexes. No cranial nerve deficit. Coordination normal.  Skin: Skin is warm and dry. Capillary refill takes less than 2 seconds.  Large ecchymoses of right anterior shin with some spider veins beneath, yellow area around it  is moving distally  Psychiatric: She has a normal mood and affect.  Anxious and jittery    Labs reviewed: Basic Metabolic Panel:  Recent Labs  04/13/15 09/14/15 1051  NA 139 139  K 4.5 4.4  CL  --  101  CO2  --  31  GLUCOSE  --  79  BUN 19 13  CREATININE 0.6 0.63  CALCIUM  --  9.6   Liver Function Tests:  Recent Labs  09/14/15 1051  AST 23  ALT 13  ALKPHOS 76  BILITOT 0.8  PROT 6.9  ALBUMIN 4.1   No results for input(s): LIPASE, AMYLASE in  the last 8760 hours. No results for input(s): AMMONIA in the last 8760 hours. CBC:  Recent Labs  04/13/15 09/14/15 1051  WBC 6.5 5.9  NEUTROABS  --  3,599  HGB 12.8 13.4  HCT 38 39.6  MCV  --  91.5  PLT 196 186   Cardiac Enzymes: No results for input(s): CKTOTAL, CKMB, CKMBINDEX, TROPONINI in the last 8760 hours. BNP: Invalid input(s): POCBNP Lab Results  Component Value Date   HGBA1C 5.4 09/14/2015   Lab Results  Component Value Date   TSH 3.16 04/13/2015   Lab Results  Component Value Date   VITAMINB12 597 09/14/2015    Assessment/Plan 1. Annual physical exam -refuses vaccines -up to date otherwise  2. Mixed obsessional thoughts and acts - after neuropsych testing, it was determined that OCD and some anxiety are the biggest contributors to a little bit of MCI, but no signs of dementia noted -begin zoloft and f/u in 6 wks to see how she is doing with this, if well but still having some problems with hoarding, and ocd tendencies, may increase to 50mg  - sertraline (ZOLOFT) 25 MG tablet; Take 1 tablet (25 mg total) by mouth daily.  Dispense: 30 tablet; Refill: 1  3. Contusion of right lower extremity, initial encounter -she does not recall injury, but appears she did bump her shin and perhaps ruptured a spider vein--instructed that this may move down her right leg into her foot, but I am not concerned about it, monitor  Labs/tests ordered:  Will do labs before visit at 3 mos. F/u 6 wks on  OCD/anxiety  Halei Hanover L. Yuli Lanigan, D.O. [facility] [address], [place] [address] Cell Phone (Mon-Fri 8am-5pm):  [phone] On Call:  [phone] & follow prompts after 5pm & weekends Office Phone:  [phone] Office Fax:  [phone]

## 2016-03-07 ENCOUNTER — Ambulatory Visit (AMBULATORY_SURGERY_CENTER): Payer: Self-pay

## 2016-03-07 ENCOUNTER — Encounter: Payer: Self-pay | Admitting: Internal Medicine

## 2016-03-07 VITALS — Ht 68.0 in | Wt 140.0 lb

## 2016-03-07 DIAGNOSIS — Z86010 Personal history of colonic polyps: Secondary | ICD-10-CM

## 2016-03-07 MED ORDER — NA SULFATE-K SULFATE-MG SULF 17.5-3.13-1.6 GM/177ML PO SOLN: 1.0000 | Freq: Once | ORAL | 0 refills | Status: AC

## 2016-03-07 NOTE — Progress Notes (Signed)
Patient denies allergy to eggs and soy. Patient is not taking diet pills. Patient is not on O2 at home.  Patient declines emmi. No problems with anesthesia.

## 2016-03-15 DIAGNOSIS — F422 Mixed obsessional thoughts and acts: Secondary | ICD-10-CM | POA: Diagnosis not present

## 2016-03-18 ENCOUNTER — Encounter: Payer: Self-pay | Admitting: Internal Medicine

## 2016-03-18 ENCOUNTER — Ambulatory Visit (INDEPENDENT_AMBULATORY_CARE_PROVIDER_SITE_OTHER): Payer: Medicare HMO | Admitting: Internal Medicine

## 2016-03-18 VITALS — BP 138/70 | HR 62 | Temp 97.7°F | Wt 137.0 lb

## 2016-03-18 DIAGNOSIS — F423 Hoarding disorder: Secondary | ICD-10-CM | POA: Diagnosis not present

## 2016-03-18 DIAGNOSIS — F422 Mixed obsessional thoughts and acts: Secondary | ICD-10-CM

## 2016-03-18 DIAGNOSIS — M79674 Pain in right toe(s): Secondary | ICD-10-CM | POA: Diagnosis not present

## 2016-03-18 DIAGNOSIS — B351 Tinea unguium: Secondary | ICD-10-CM

## 2016-03-18 DIAGNOSIS — G250 Essential tremor: Secondary | ICD-10-CM

## 2016-03-18 MED ORDER — SERTRALINE HCL 25 MG PO TABS: 25.0000 mg | ORAL_TABLET | Freq: Every day | ORAL | 3 refills | Status: DC

## 2016-03-18 NOTE — Progress Notes (Signed)
Location:  [facility] clinic Provider:  Daizy Outen L. Mariea Clonts, D.O., C.M.D.  Code Status: full code Goals of Care:  Advanced Directives 03/07/2016  Does Patient Have a Medical Advance Directive? No  Type of Advance Directive -  Copy of [place] in Chart? -  Would patient like information on creating a medical advance directive? -   Chief Complaint  Patient presents with  . Follow-up    anxiety    HPI: Patient is a 76 y.o. female seen today for f/u on anxiety and obsessive compulsive personality disorder as determined by her neuropsych testing (due to memory concerns).  I started her on zoloft last visit at just 25mg  daily.   She continues to take a variety of supplements including vitamin K, turmeric, juice fibre, osteo complex MVI, vitamin D, calcium with D, biotin and a B vitamin.  Feels she is more aware of the things she needs to get done.  Her son in law got diagnosed with colon cancer and pt has had to spend the night at their house to check on cats.  After about the 3rd week, she noticed clutter/hoarding situation more.  She reports she had some zoloft that she found so she has not run out.  Her daughter thinks she needs to increase the dose.  There were a couple days where she may have taken it twice and she may have been jittery afterwards (not sure if related).  Her son-in-law is now home.  She does still occasionally have the hand tremors.  The left one will continue to tremor after she shakes it but the right will not.    Past Medical History:  Diagnosis Date  . Anxiety   . Cancer [facility])    Breast/ bilateral mastectomy  . 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 Osteoporosis     Past Surgical History:  Procedure Laterality Date  . BREAST SURGERY    . FOOT SURGERY    . HYSTERECTOMY ABDOMINAL WITH SALPINGECTOMY  2010  . MASTECTOMY    . NASAL SEPTUM SURGERY    . TONSILLECTOMY AND ADENOIDECTOMY    . TUBAL LIGATION      No Known  Allergies  Allergies as of 03/18/2016   No Known Allergies     Medication List       Accurate as of 03/18/16  9:44 AM. Always use your most recent med list.          b complex vitamins tablet Take 1 tablet by mouth daily.   BIOTIN PO Take 2,000 mcg by mouth daily.   CALCIUM 1000 + D PO Take by mouth.   JUICE PLUS FIBRE PO Take 1 tablet by mouth daily.   OSTEO COMPLEX PO Take 2 tablets by mouth 2 (two) times daily.   sertraline 25 MG tablet Commonly known as:  ZOLOFT Take 1 tablet (25 mg total) by mouth daily.   TURMERIC PO Take 1 tablet by mouth daily.   Vitamin D3 5000 units Caps Take 1 capsule by mouth daily.   VITAMIN K PO Take 2 tablets by mouth daily.      Review of Systems:  Review of Systems  Constitutional: Negative for chills, fever and malaise/fatigue.  HENT: Negative for hearing loss.   Eyes: Positive for blurred vision.       Going to get vision rechecked (could see better through dollar store reading glasses)  Respiratory: Negative for cough and shortness of breath.   Cardiovascular: Negative for chest pain, palpitations  and leg swelling.  Gastrointestinal: Negative for abdominal pain, blood in stool, constipation and melena.  Genitourinary: Negative for dysuria.  Musculoskeletal: Negative for falls.  Neurological: Positive for tremors. Negative for dizziness, loss of consciousness and weakness.  Psychiatric/Behavioral: Negative for depression and memory loss. The patient is nervous/anxious. The patient does not have insomnia.     Health Maintenance  Topic Date Due  . ZOSTAVAX  05/02/2000  . COLONOSCOPY  11/24/2020  . TETANUS/TDAP  01/31/2022  . DEXA SCAN  Completed  . PNA vac Low Risk Adult  Completed    Physical Exam: There were no vitals filed for this visit. There is no height or weight on file to calculate BMI. Physical Exam  Constitutional: She is oriented to person, place, and time. She appears well-developed and  well-nourished. No distress.  Cardiovascular: Normal rate, regular rhythm, normal heart sounds and intact distal pulses.   Pulmonary/Chest: Effort normal and breath sounds normal. No respiratory distress.  Abdominal: Soft. Bowel sounds are normal. She exhibits no distension and no mass. There is no tenderness. There is no rebound and no guarding. No hernia.  Musculoskeletal: Normal range of motion.  Neurological: She is alert and oriented to person, place, and time. She exhibits normal muscle tone.  Tremor after she shakes her arms left greater than right  Skin: Skin is warm and dry. Capillary refill takes less than 2 seconds.  Raised red pinpoint papules on right great toe; also has fungus on tip of great toenail  Psychiatric:  Anxious and jittery    Labs reviewed: Basic Metabolic Panel:  Recent Labs  04/13/15 09/14/15 1051  NA 139 139  K 4.5 4.4  CL  --  101  CO2  --  31  GLUCOSE  --  79  BUN 19 13  CREATININE 0.6 0.63  CALCIUM  --  9.6  TSH 3.16  --    Liver Function Tests:  Recent Labs  09/14/15 1051  AST 23  ALT 13  ALKPHOS 76  BILITOT 0.8  PROT 6.9  ALBUMIN 4.1   No results for input(s): LIPASE, AMYLASE in the last 8760 hours. No results for input(s): AMMONIA in the last 8760 hours. CBC:  Recent Labs  04/13/15 09/14/15 1051  WBC 6.5 5.9  NEUTROABS  --  3,599  HGB 12.8 13.4  HCT 38 39.6  MCV  --  91.5  PLT 196 186   Lipid Panel:  Recent Labs  04/13/15  CHOL 192  HDL 116*  LDLCALC 105  TRIG 52   Lab Results  Component Value Date   HGBA1C 5.4 09/14/2015    Assessment/Plan 1. Pain due to onychomycosis of toenail of right foot -cont vicks on nail and advised to use some cortisone on the toe itself  2. Mixed obsessional thoughts and acts -pt wants to keep her dose the same so will - sertraline (ZOLOFT) 25 MG tablet; Take 1 tablet (25 mg total) by mouth daily.  Dispense: 30 tablet; Refill: 3  3. Hoarding behavior -cont zoloft 25mg   daily--may need increase if stressors with her son in law's illness continue  4. Essential tremor -does not want treatment at this time -will continue to monitor to ensure no parkinsonism developing  Labs/tests ordered:  No orders of the defined types were placed in this encounter.  Next appt:  06/16/2016 med mgt and sooner prn   Elka Satterfield L. Donie Lemelin, D.O. [facility] [address], [place] [address] Cell Phone (  Mon-Fri 8am-5pm):  [phone] On Call:  [phone] & follow prompts after 5pm & weekends Office Phone:  [phone] Office Fax:  [phone]

## 2016-03-21 ENCOUNTER — Ambulatory Visit (AMBULATORY_SURGERY_CENTER): Payer: Medicare HMO | Admitting: Internal Medicine

## 2016-03-21 ENCOUNTER — Encounter: Payer: Self-pay | Admitting: Internal Medicine

## 2016-03-21 VITALS — BP 135/76 | HR 67 | Temp 98.0°F | Resp 16 | Ht 68.0 in | Wt 140.0 lb

## 2016-03-21 DIAGNOSIS — Z86010 Personal history of colonic polyps: Secondary | ICD-10-CM

## 2016-03-21 MED ORDER — SODIUM CHLORIDE 0.9 % IV SOLN: 500.0000 mL | INTRAVENOUS | Status: DC

## 2016-03-21 NOTE — Patient Instructions (Signed)
Impression/Recommendations:  Diverticulosis handout given to patient.  YOU HAD AN ENDOSCOPIC PROCEDURE TODAY AT THE [facility]:   Refer to the procedure report that was given to you for any specific questions about what was found during the examination.  If the procedure report does not answer your questions, please call your gastroenterologist to clarify.  If you requested that your care partner not be given the details of your procedure findings, then the procedure report has been included in a sealed envelope for you to review at your convenience later.  YOU SHOULD EXPECT: Some feelings of bloating in the abdomen. Passage of more gas than usual.  Walking can help get rid of the air that was put into your GI tract during the procedure and reduce the bloating. If you had a lower endoscopy (such as a colonoscopy or flexible sigmoidoscopy) you may notice spotting of blood in your stool or on the toilet paper. If you underwent a bowel prep for your procedure, you may not have a normal bowel movement for a few days.  Please Note:  You might notice some irritation and congestion in your nose or some drainage.  This is from the oxygen used during your procedure.  There is no need for concern and it should clear up in a day or so.  SYMPTOMS TO REPORT IMMEDIATELY:   Following lower endoscopy (colonoscopy or flexible sigmoidoscopy):  Excessive amounts of blood in the stool  Significant tenderness or worsening of abdominal pains  Swelling of the abdomen that is new, acute  Fever of 100F or higher For urgent or emergent issues, a gastroenterologist can be reached at any hour by calling [phone].   DIET:  We do recommend a small meal at first, but then you may proceed to your regular diet.  Drink plenty of fluids but you should avoid alcoholic beverages for 24 hours.  ACTIVITY:  You should plan to take it easy for the rest of today and you should NOT DRIVE or use heavy machinery  until tomorrow (because of the sedation medicines used during the test).    FOLLOW UP: Our staff will call the number listed on your records the next business day following your procedure to check on you and address any questions or concerns that you may have regarding the information given to you following your procedure. If we do not reach you, we will leave a message.  However, if you are feeling well and you are not experiencing any problems, there is no need to return our call.  We will assume that you have returned to your regular daily activities without incident.  If any biopsies were taken you will be contacted by phone or by letter within the next 1-3 weeks.  Please call us at [phone] if you have not heard about the biopsies in 3 weeks.    SIGNATURES/CONFIDENTIALITY: You and/or your care partner have signed paperwork which will be entered into your electronic medical record.  These signatures attest to the fact that that the information above on your After Visit Summary has been reviewed and is understood.  Full responsibility of the confidentiality of this discharge information lies with you and/or your care-partner. 

## 2016-03-21 NOTE — Progress Notes (Signed)
Report given to PACU, vss 

## 2016-03-21 NOTE — Op Note (Signed)
[place] Patient Name: Yolanda Robbins Procedure Date: 03/21/2016 1:03 PM MRN: [id number] Endoscopist: Docia Chuck. Henrene Pastor , MD Age: 76 Referring MD:  Date of Birth: 04-03-1940 Gender: Female Account #: [id number] Procedure:                Colonoscopy Indications:              High risk colon cancer surveillance: Personal                            history of adenoma (10 mm or greater in size).                            Previous examinations with Dr. Earlean Shawl 2002, 2007,                            2009 (10 mm tubular adenoma), and 2012 (negative) Medicines:                Monitored Anesthesia Care Procedure:                Pre-Anesthesia Assessment:                           - Prior to the procedure, a History and Physical                            was performed, and patient medications and                            allergies were reviewed. The patient's tolerance of                            previous anesthesia was also reviewed. The risks                            and benefits of the procedure and the sedation                            options and risks were discussed with the patient.                            All questions were answered, and informed consent                            was obtained. Prior Anticoagulants: The patient has                            taken no previous anticoagulant or antiplatelet                            agents. ASA Grade Assessment: II - A patient with                            mild systemic disease. After reviewing the risks  and benefits, the patient was deemed in                            satisfactory condition to undergo the procedure.                           After obtaining informed consent, the colonoscope                            was passed under direct vision. Throughout the                            procedure, the patient's blood pressure, pulse, and                            oxygen saturations  were monitored continuously. The                            Model CF-HQ190L [id number]) scope was introduced                            through the anus and advanced to the the cecum,                            identified by appendiceal orifice and ileocecal                            valve. The ileocecal valve, appendiceal orifice,                            and rectum were photographed. The quality of the                            bowel preparation was good. The colonoscopy was                            performed without difficulty. The patient tolerated                            the procedure well. The bowel preparation used was                            SUPREP. Scope In: 1:39:15 PM Scope Out: 1:54:43 PM Scope Withdrawal Time: 0 hours 11 minutes 15 seconds  Total Procedure Duration: 0 hours 15 minutes 28 seconds  Findings:                 Multiple small and large-mouthed diverticula were                            found in the left colon and right colon.                           The exam was otherwise without abnormality on  direct and retroflexion views. Complications:            No immediate complications. Estimated blood loss:                            None. Estimated Blood Loss:     Estimated blood loss: none. Impression:               - Diverticulosis in the left colon and in the right                            colon.                           - The examination was otherwise normal on direct                            and retroflexion views.                           - No specimens collected. Recommendation:           - Repeat colonoscopy is not recommended for                            surveillance.                           - Patient has a contact number available for                            emergencies. The signs and symptoms of potential                            delayed complications were discussed with the                             patient. Return to normal activities tomorrow.                            Written discharge instructions were provided to the                            patient.                           - Resume previous diet.                           - Continue present medications. Docia Chuck. Henrene Pastor, MD 03/21/2016 1:58:57 PM This report has been signed electronically.

## 2016-03-22 ENCOUNTER — Telehealth: Payer: Self-pay | Admitting: *Deleted

## 2016-03-22 NOTE — Telephone Encounter (Signed)
  Follow up Call-  Call back number 03/21/2016  Post procedure Call Back phone  # [phone]  Permission to leave phone message Yes  Some recent data might be hidden     Patient questions:  Do you have a fever, pain , or abdominal swelling? No. Pain Score  0 *  Have you tolerated food without any problems? Yes.    Have you been able to return to your normal activities? Yes.    Do you have any questions about your discharge instructions: Diet   No. Medications  No. Follow up visit  No.  Do you have questions or concerns about your Care? No.  Actions: * If pain score is 4 or above: No action needed, pain <4.

## 2016-03-23 DIAGNOSIS — S0500XA Injury of conjunctiva and corneal abrasion without foreign body, unspecified eye, initial encounter: Secondary | ICD-10-CM | POA: Diagnosis not present

## 2016-03-23 DIAGNOSIS — Z961 Presence of intraocular lens: Secondary | ICD-10-CM | POA: Diagnosis not present

## 2016-03-23 DIAGNOSIS — H11423 Conjunctival edema, bilateral: Secondary | ICD-10-CM | POA: Diagnosis not present

## 2016-03-23 DIAGNOSIS — H11153 Pinguecula, bilateral: Secondary | ICD-10-CM | POA: Diagnosis not present

## 2016-03-23 DIAGNOSIS — H18413 Arcus senilis, bilateral: Secondary | ICD-10-CM | POA: Diagnosis not present

## 2016-03-23 DIAGNOSIS — Z9849 Cataract extraction status, unspecified eye: Secondary | ICD-10-CM | POA: Diagnosis not present

## 2016-03-23 DIAGNOSIS — H04123 Dry eye syndrome of bilateral lacrimal glands: Secondary | ICD-10-CM | POA: Diagnosis not present

## 2016-03-25 ENCOUNTER — Telehealth: Payer: Self-pay

## 2016-03-25 DIAGNOSIS — M810 Age-related osteoporosis without current pathological fracture: Secondary | ICD-10-CM

## 2016-03-25 NOTE — Telephone Encounter (Signed)
Patient was calling to request an order for Bone Density, order placed. Last BMD 2016

## 2016-04-08 ENCOUNTER — Other Ambulatory Visit: Payer: Medicare HMO

## 2016-04-11 ENCOUNTER — Other Ambulatory Visit: Payer: Medicare HMO

## 2016-04-18 DIAGNOSIS — E039 Hypothyroidism, unspecified: Secondary | ICD-10-CM | POA: Diagnosis not present

## 2016-04-21 DIAGNOSIS — H11423 Conjunctival edema, bilateral: Secondary | ICD-10-CM | POA: Diagnosis not present

## 2016-04-21 DIAGNOSIS — H11153 Pinguecula, bilateral: Secondary | ICD-10-CM | POA: Diagnosis not present

## 2016-04-21 DIAGNOSIS — H18413 Arcus senilis, bilateral: Secondary | ICD-10-CM | POA: Diagnosis not present

## 2016-04-21 DIAGNOSIS — Z961 Presence of intraocular lens: Secondary | ICD-10-CM | POA: Diagnosis not present

## 2016-04-21 DIAGNOSIS — H16223 Keratoconjunctivitis sicca, not specified as Sjogren's, bilateral: Secondary | ICD-10-CM | POA: Diagnosis not present

## 2016-04-21 DIAGNOSIS — H16143 Punctate keratitis, bilateral: Secondary | ICD-10-CM | POA: Diagnosis not present

## 2016-04-21 DIAGNOSIS — H04123 Dry eye syndrome of bilateral lacrimal glands: Secondary | ICD-10-CM | POA: Diagnosis not present

## 2016-04-21 DIAGNOSIS — Z9849 Cataract extraction status, unspecified eye: Secondary | ICD-10-CM | POA: Diagnosis not present

## 2016-06-16 ENCOUNTER — Ambulatory Visit (INDEPENDENT_AMBULATORY_CARE_PROVIDER_SITE_OTHER): Payer: Medicare HMO | Admitting: Internal Medicine

## 2016-06-16 ENCOUNTER — Encounter: Payer: Self-pay | Admitting: Internal Medicine

## 2016-06-16 VITALS — BP 120/80 | HR 72 | Temp 97.6°F | Wt 136.0 lb

## 2016-06-16 DIAGNOSIS — R4789 Other speech disturbances: Secondary | ICD-10-CM

## 2016-06-16 DIAGNOSIS — M4003 Postural kyphosis, cervicothoracic region: Secondary | ICD-10-CM

## 2016-06-16 DIAGNOSIS — G250 Essential tremor: Secondary | ICD-10-CM | POA: Diagnosis not present

## 2016-06-16 DIAGNOSIS — F422 Mixed obsessional thoughts and acts: Secondary | ICD-10-CM

## 2016-06-16 MED ORDER — SERTRALINE HCL 25 MG PO TABS: 25.0000 mg | ORAL_TABLET | ORAL | 3 refills | Status: DC

## 2016-06-16 NOTE — Progress Notes (Signed)
Location:  [facility] clinic Provider:  Ravin Bendall L. Mariea Clonts, D.O., C.M.D.  Code Status: full code Goals of Care:  Advanced Directives 03/07/2016  Does Patient Have a Medical Advance Directive? No  Type of Advance Directive -  Copy of [place] in Chart? -  Would patient like information on creating a medical advance directive? -   Chief Complaint  Patient presents with  . Medical Management of Chronic Issues    55mth follow-up    HPI: Patient is a 76 y.o. female seen today for medical management of chronic diseases.    She left her list at home of concerns/questions.  She gradually thought of them through the visit.  She had arrived 15 mins late today.  OCD per neuropsych consult.  She is taking the zoloft 25mg .  She does notice her tremors (had before starting it).  She says she also notices easy bruising.  She is going to discuss an alternative to the zoloft with the alternative medicine doctor.  She wants to be tapered back off the zoloft though she seems less anxious on it to me.    Weight is steady she says--we have down 4 lbs w/o shoes.  She eats well, she says.  Says her daughter wants her to eat more.  Continues to do water aerobics, does not eat sugar.  Doing water aerobics.  Having problems with posture.  Sees an upper cervical person and a chiropractor.  She would need a referral for the posture issues.  She had an iodine test done with the alternative doctor she's been going to with her daughter.  She is taking iodine per them.    She is taking a probiotic and the last week or so, she's having more growling in her stomach.  She eats fast in the morning or eats late. She has been having more soft bms in the am (2-3). Reports more almonds, pecans and dried fruit as well as [place] intake with beans.  Discussed shingrix.    Thinks she is having vaginal prolapse.  She is having more frequent urination.  Advised to call back to schedule pelvic exam b/c she brought this  up after the visit was over.  Past Medical History:  Diagnosis Date  . Anxiety   . Cancer [facility])    Breast/ bilateral mastectomy  . 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 Osteoporosis     Past Surgical History:  Procedure Laterality Date  . BREAST SURGERY    . FOOT SURGERY    . HYSTERECTOMY ABDOMINAL WITH SALPINGECTOMY  2010  . MASTECTOMY    . NASAL SEPTUM SURGERY    . TONSILLECTOMY AND ADENOIDECTOMY    . TUBAL LIGATION      No Known Allergies  Allergies as of 06/16/2016   No Known Allergies     Medication List       Accurate as of 06/16/16 10:53 AM. Always use your most recent med list.          b complex vitamins tablet Take 1 tablet by mouth daily.   BIOTIN PO Take 2,000 mcg by mouth daily.   CALCIUM 1000 + D PO Take by mouth.   COQ10 PO Take by mouth.   JUICE PLUS FIBRE PO Take 1 tablet by mouth daily.   OSTEO COMPLEX PO Take 2 tablets by mouth 2 (two) times daily.   sertraline 25 MG tablet Commonly known as:  ZOLOFT Take 1 tablet (25 mg total) by mouth daily.  TURMERIC PO Take 1 tablet by mouth daily.   Vitamin D3 5000 units Caps Take 1 capsule by mouth daily.   VITAMIN K PO Take 2 tablets by mouth daily.       Review of Systems:  Review of Systems  Constitutional: Negative for chills and fever.  HENT: Positive for hearing loss.   Eyes: Negative for blurred vision.  Respiratory: Negative for cough and shortness of breath.   Cardiovascular: Negative for chest pain and palpitations.  Gastrointestinal: Negative for abdominal pain, blood in stool, constipation, diarrhea and melena.  Genitourinary: Negative for dysuria.  Musculoskeletal: Negative for falls.  Skin: Negative for itching and rash.  Neurological: Negative for dizziness and loss of consciousness.  Endo/Heme/Allergies: Bruises/bleeds easily.  Psychiatric/Behavioral: Negative for depression, hallucinations, memory loss, substance abuse and suicidal ideas. The  patient is nervous/anxious. The patient does not have insomnia.     Health Maintenance  Topic Date Due  . TETANUS/TDAP  01/31/2022  . DEXA SCAN  Completed  . PNA vac Low Risk Adult  Completed    Physical Exam: Vitals:   06/16/16 1048  BP: 120/80  Pulse: 72  Temp: 97.6 F (36.4 C)  TempSrc: Oral  SpO2: 97%  Weight: 136 lb (61.7 kg)   Body mass index is 20.68 kg/m. Physical Exam  Constitutional: She is oriented to person, place, and time. She appears well-developed and well-nourished. No distress.  Cardiovascular: Normal rate, regular rhythm, normal heart sounds and intact distal pulses.   Pulmonary/Chest: Effort normal and breath sounds normal. No respiratory distress.  Abdominal: Bowel sounds are normal.  Musculoskeletal: Normal range of motion. She exhibits no tenderness.  Hunched posture unless she thinks about it with mild kyphosis  Neurological: She is alert and oriented to person, place, and time.  Skin: Skin is warm and dry. Capillary refill takes less than 2 seconds.  Psychiatric:  Anxious and some word-finding difficulty    Labs reviewed: Basic Metabolic Panel:  Recent Labs  09/14/15 1051  NA 139  K 4.4  CL 101  CO2 31  GLUCOSE 79  BUN 13  CREATININE 0.63  CALCIUM 9.6   Liver Function Tests:  Recent Labs  09/14/15 1051  AST 23  ALT 13  ALKPHOS 76  BILITOT 0.8  PROT 6.9  ALBUMIN 4.1   No results for input(s): LIPASE, AMYLASE in the last 8760 hours. No results for input(s): AMMONIA in the last 8760 hours. CBC:  Recent Labs  09/14/15 1051  WBC 5.9  NEUTROABS 3,599  HGB 13.4  HCT 39.6  MCV 91.5  PLT 186   Lipid Panel: No results for input(s): CHOL, HDL, LDLCALC, TRIG, CHOLHDL, LDLDIRECT in the last 8760 hours. Lab Results  Component Value Date   HGBA1C 5.4 09/14/2015    Assessment/Plan 1. Mixed obsessional thoughts and acts - will taper off zoloft at her request, but I think it was actually helping a little bit with her  obsessive thoughts -hoping she does not have another major side effect from alternative tx like she did when she had the transient loss of consciousness  - sertraline (ZOLOFT) 25 MG tablet; Take 1 tablet (25 mg total) by mouth every other day. For two weeks, then stop  Dispense: 30 tablet; Refill: 3  2. Postural kyphosis of cervicothoracic region - refer for PT to help with posture  - Ambulatory referral to Physical Therapy  3. Essential tremor -ongoing, was present before zoloft so I don't think we can blame it for it and cannot  blame her bruising either which comes from thinning skin with age  69. Word finding difficulty -ongoing, seems anxiety-related primarily  Labs/tests ordered:   Orders Placed This Encounter  Procedures  . Ambulatory referral to Physical Therapy    Referral Priority:   Routine    Referral Type:   Physical Medicine    Referral Reason:   Specialty Services Required    Requested Specialty:   Physical Therapy    Number of Visits Requested:   1   Next appt:  06/20/2016  Alfie Alderfer L. Kenniel Bergsma, D.O. [facility] [address], [place] [address] Cell Phone (Mon-Fri 8am-5pm):  [phone] On Call:  [phone] & follow prompts after 5pm & weekends Office Phone:  [phone] Office Fax:  [phone]

## 2016-06-16 NOTE — Patient Instructions (Signed)
If you can cut the zoloft in half, do that for 2 weeks, then stop (rather than every other day).

## 2016-06-20 ENCOUNTER — Ambulatory Visit: Payer: Medicare HMO | Admitting: Internal Medicine

## 2016-06-23 DIAGNOSIS — Z79899 Other long term (current) drug therapy: Secondary | ICD-10-CM | POA: Diagnosis not present

## 2016-06-23 DIAGNOSIS — E039 Hypothyroidism, unspecified: Secondary | ICD-10-CM | POA: Diagnosis not present

## 2016-07-04 ENCOUNTER — Ambulatory Visit (INDEPENDENT_AMBULATORY_CARE_PROVIDER_SITE_OTHER): Payer: Medicare HMO | Admitting: Internal Medicine

## 2016-07-04 ENCOUNTER — Encounter: Payer: Self-pay | Admitting: Internal Medicine

## 2016-07-04 VITALS — BP 120/70 | HR 69 | Temp 98.4°F | Wt 139.0 lb

## 2016-07-04 DIAGNOSIS — F422 Mixed obsessional thoughts and acts: Secondary | ICD-10-CM | POA: Diagnosis not present

## 2016-07-04 DIAGNOSIS — E890 Postprocedural hypothyroidism: Secondary | ICD-10-CM | POA: Diagnosis not present

## 2016-07-04 DIAGNOSIS — N8110 Cystocele, unspecified: Secondary | ICD-10-CM

## 2016-07-04 NOTE — Progress Notes (Signed)
Location:  [facility] clinic Provider: Jashayla Glatfelter L. Mariea Clonts, D.O., C.M.D.  Code Status: full code Goals of Care:  Advanced Directives 07/04/2016  Does Patient Have a Medical Advance Directive? No  Type of Advance Directive -  Copy of [place] in Chart? -  Would patient like information on creating a medical advance directive? No - Patient declined   Chief Complaint  Patient presents with  . Acute Visit    vaginal prolapse    HPI: Patient is a 76 y.o. female seen today for an acute visit for vaginal prolapse.  Turns out patient has known already that she has this and is wondering if she needs a procedure to tack it up.  She had previously discussed her concerns with Dr. Tamala Julian from gyn (church friend) and their office was to call her to set up an appt, but she has not heard from them.  She notes that she feels her vagina protruding out when she wipes.  She also notes some leakage on her pad or underwear.  She does not feel it when standing up.    Is back on thyroid medication after taking iodine from Dr. Sharlett Iles (integrative) due to "very low" .    He also prescribed her celexa for OCD after I stopped the zoloft b/c she thought she had a side effect.  She does need something for her anxiety and compulsive tendencies.  All of her care is directed by her daughter who also sees this integrative doctor.     Past Medical History:  Diagnosis Date  . Anxiety   . Cancer [facility])    Breast/ bilateral mastectomy  . 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 Osteoporosis     Past Surgical History:  Procedure Laterality Date  . BREAST SURGERY    . FOOT SURGERY    . HYSTERECTOMY ABDOMINAL WITH SALPINGECTOMY  2010  . MASTECTOMY    . NASAL SEPTUM SURGERY    . TONSILLECTOMY AND ADENOIDECTOMY    . TUBAL LIGATION      No Known Allergies  Allergies as of 07/04/2016   No Known Allergies     Medication List       Accurate as of 07/04/16  4:02 PM. Always use your most  recent med list.          ARMOUR THYROID 30 MG tablet Generic drug:  thyroid Take 30 mg by mouth daily before breakfast.   b complex vitamins tablet Take 1 tablet by mouth daily.   BIOTIN PO Take 2,000 mcg by mouth daily.   CALCIUM 1000 + D PO Take by mouth.   citalopram 10 MG tablet Commonly known as:  CELEXA Take 10 mg by mouth daily.   COQ10 PO Take by mouth.   JUICE PLUS FIBRE PO Take 1 tablet by mouth daily.   OSTEO COMPLEX PO Take 2 tablets by mouth 2 (two) times daily.   TURMERIC PO Take 1 tablet by mouth daily.   Vitamin D3 5000 units Caps Take 1 capsule by mouth daily.   VITAMIN K PO Take 2 tablets by mouth daily.       Review of Systems:  Review of Systems  Constitutional: Negative for chills, fever and weight loss.  HENT: Negative for hearing loss.   Eyes: Negative for blurred vision.  Respiratory: Negative for shortness of breath.   Cardiovascular: Negative for chest pain.  Gastrointestinal: Negative for abdominal pain.  Genitourinary: Positive for frequency. Negative for dysuria and urgency.  Vaginal prolapse  Musculoskeletal: Negative for falls.  Skin: Negative for rash.  Neurological: Negative for dizziness.  Psychiatric/Behavioral: Negative for memory loss. The patient is nervous/anxious. The patient does not have insomnia.        OCD    Health Maintenance  Topic Date Due  . TETANUS/TDAP  01/31/2022  . DEXA SCAN  Completed  . PNA vac Low Risk Adult  Completed    Physical Exam: Vitals:   07/04/16 1552  BP: 120/70  Pulse: 69  Temp: 98.4 F (36.9 C)  TempSrc: Oral  SpO2: 96%  Weight: 139 lb (63 kg)   Body mass index is 21.13 kg/m. Physical Exam  Constitutional: She is oriented to person, place, and time. She appears well-developed. No distress.  Cardiovascular: Normal rate, regular rhythm, normal heart sounds and intact distal pulses.   Pulmonary/Chest: Effort normal and breath sounds normal. No respiratory distress.   Abdominal: Soft. Bowel sounds are normal. She exhibits no distension. There is no tenderness.  Genitourinary: No vaginal discharge found.  Genitourinary Comments: Prolapse of vaginal wall noted on exam, worsened with cough/bearing down; no urinary leakage during these maneuvers, has some excoriated areas of labia also  Musculoskeletal: Normal range of motion.  Neurological: She is alert and oriented to person, place, and time.  Skin: Skin is warm and dry.    Labs reviewed: Basic Metabolic Panel:  Recent Labs  09/14/15 1051  NA 139  K 4.4  CL 101  CO2 31  GLUCOSE 79  BUN 13  CREATININE 0.63  CALCIUM 9.6   Liver Function Tests:  Recent Labs  09/14/15 1051  AST 23  ALT 13  ALKPHOS 76  BILITOT 0.8  PROT 6.9  ALBUMIN 4.1   No results for input(s): LIPASE, AMYLASE in the last 8760 hours. No results for input(s): AMMONIA in the last 8760 hours. CBC:  Recent Labs  09/14/15 1051  WBC 5.9  NEUTROABS 3,599  HGB 13.4  HCT 39.6  MCV 91.5  PLT 186   Lipid Panel: No results for input(s): CHOL, HDL, LDLCALC, TRIG, CHOLHDL, LDLDIRECT in the last 8760 hours. Lab Results  Component Value Date   HGBA1C 5.4 09/14/2015    Assessment/Plan 1. 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 Mixed obsessional thoughts and acts -cont celexa which she agreed to when started by integrative doctor--expect she'll tolerate it b/c it came from him rather than a regular medical doctor  Labs/tests ordered:   Orders Placed This Encounter  Procedures  . Ambulatory referral to Gynecology    Referral Priority:   Routine    Referral Type:    Consultation    Referral Reason:   Specialty Services Required    Requested Specialty:   Gynecology    Number of Visits Requested:   1   Next appt:  09/19/2016  Aleli Navedo L. Maizey Menendez, D.O. [facility] [address], [place] [address] Cell Phone (Mon-Fri 8am-5pm):  [phone] On Call:  [phone] & follow prompts after 5pm & weekends Office Phone:  [phone] Office Fax:  [phone]

## 2016-07-05 DIAGNOSIS — E890 Postprocedural hypothyroidism: Secondary | ICD-10-CM | POA: Insufficient documentation

## 2016-07-05 DIAGNOSIS — N8110 Cystocele, unspecified: Secondary | ICD-10-CM | POA: Insufficient documentation

## 2016-07-05 DIAGNOSIS — F422 Mixed obsessional thoughts and acts: Secondary | ICD-10-CM | POA: Insufficient documentation

## 2016-07-06 ENCOUNTER — Telehealth: Payer: Self-pay | Admitting: Obstetrics and Gynecology

## 2016-07-06 NOTE — Telephone Encounter (Signed)
Thank you for the update.  Encounter closed. 

## 2016-07-06 NOTE — Telephone Encounter (Signed)
Patient returned call placed by [facility] on 07/06/16. Patient was referred to our office by Dr Hollace Kinnier for vaginal prolapse. Scheduling options were reviewed with our nurse supervisor. Patient is scheduled 07/07/16 with Dr Quincy Simmonds. Patient is aware of appointment date, arrival time and cancellation policy. Patient had no further questions.   cc: Dr Quincy Simmonds

## 2016-07-07 ENCOUNTER — Encounter: Payer: Medicare HMO | Admitting: Obstetrics and Gynecology

## 2016-07-07 ENCOUNTER — Ambulatory Visit: Payer: Medicare HMO | Attending: Internal Medicine

## 2016-07-07 DIAGNOSIS — R296 Repeated falls: Secondary | ICD-10-CM | POA: Insufficient documentation

## 2016-07-07 DIAGNOSIS — R2689 Other abnormalities of gait and mobility: Secondary | ICD-10-CM | POA: Insufficient documentation

## 2016-07-07 DIAGNOSIS — R269 Unspecified abnormalities of gait and mobility: Secondary | ICD-10-CM | POA: Diagnosis not present

## 2016-07-07 DIAGNOSIS — R2681 Unsteadiness on feet: Secondary | ICD-10-CM | POA: Diagnosis not present

## 2016-07-07 DIAGNOSIS — M4003 Postural kyphosis, cervicothoracic region: Secondary | ICD-10-CM

## 2016-07-07 DIAGNOSIS — R293 Abnormal posture: Secondary | ICD-10-CM

## 2016-07-07 DIAGNOSIS — M6281 Muscle weakness (generalized): Secondary | ICD-10-CM

## 2016-07-07 NOTE — Patient Instructions (Signed)
Instructed pt in standing with use of door jamb to align spine keeping pelvis and upper back against frame pulling head back without extension as far as able Hold 10-30 sec 3-5 reps 2-4x/day

## 2016-07-07 NOTE — Therapy (Signed)
[place], [place], [address] Phone: [phone]   Fax:  [phone]  Physical Therapy Treatment  Patient Details  Name: Yolanda Robbins MRN: [id number] Date of Birth: 01/20/1941 Referring Provider: Hollace Kinnier  Encounter Date: 07/07/2016      PT End of Session - 07/07/16 1526    Visit Number 1   Number of Visits 12   Date for PT Re-Evaluation 08/19/16   Authorization Type [employer] M[employer]   PT Start Time 0200   PT Stop Time 0223   PT Time Calculation (min) 23 min   Activity Tolerance Patient tolerated treatment well;No increased pain   Behavior During Therapy WFL for tasks assessed/performed      Past Medical History:  Diagnosis Date  . Anxiety   . Cancer [facility])    Breast/ bilateral mastectomy  . 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 Osteoporosis     Past Surgical History:  Procedure Laterality Date  . BREAST SURGERY    . FOOT SURGERY    . HYSTERECTOMY ABDOMINAL WITH SALPINGECTOMY  2010  . MASTECTOMY    . NASAL SEPTUM SURGERY    . TONSILLECTOMY AND ADENOIDECTOMY    . TUBAL LIGATION      There were no vitals filed for this visit.      Subjective Assessment - 07/07/16 1404    Subjective She reports stooped shoulders and bad posture getting worse. She gets massage and tries to have good posture.   Takes Pilates classes  now.   Sees practitioner for upper cervical issues   Pertinent History Masectomy with abdominal muscle graft   Limitations --  None.    How long can you sit comfortably? as needed   How long can you stand comfortably? as needed   How long can you walk comfortably? as needed   Diagnostic tests None   Patient Stated Goals To be able to hold shoulders back and head up.      Currently in Pain? No/denies            [facility] PT Assessment - 07/07/16 0001      Assessment   Medical Diagnosis postural kyphosis of cervicothoracic region   Referring Provider  Tiffany Reed   Onset Date/Surgical Date --  in past year   Next MD Visit as needed   Prior Therapy no     Precautions   Precaution Comments osteoporosis     Restrictions   Weight Bearing Restrictions No     Balance Screen   Has the patient fallen in the past 6 months No   Has the patient had a decrease in activity level because of a fear of falling?  No   Is the patient reluctant to leave their home because of a fear of falling?  No     Prior Function   Level of Independence Independent     Cognition   Overall Cognitive Status Within Functional Limits for tasks assessed     Observation/Other Assessments   Focus on Therapeutic Outcomes (FOTO)  4% limited     Posture/Postural Control   Posture Comments scoliosis , kyphosis  swayback      ROM / Strength   AROM / PROM / Strength AROM;Strength     AROM   AROM Assessment Site Cervical   Cervical Flexion 60  75 with thoracici flexion   Cervical Extension 40  55 with thoracic ext   Cervical - Right Side Bend 43   Cervical -  Left Side Bend 42   Cervical - Right Rotation 60   Cervical - Left Rotation 62     Strength   Overall Strength Comments WNL in UE     Ambulation/Gait   Gait Comments WNL                             PT Education - Jul 27, 2016 1525    Education provided Yes   Education Details POC , HEP,  How structureal changes can limit progress with changing posture   Person(s) Educated Patient   Methods Explanation;Demonstration;Verbal cues;Tactile cues   Comprehension Returned demonstration;Verbalized understanding          PT Short Term Goals - 07-27-16 1532      PT SHORT TERM GOAL #1   Title She will be independent with inital HEP   Time 3   Period Weeks   Status New           PT Long Term Goals - 07-27-16 1532      PT LONG TERM GOAL #1   Title She will be independent with all HEP issued    Time 6   Period Weeks   Status New     PT LONG TERM GOAL #2   Title She will  report ability to hold head and trunk erect improved 50%    Time 12   Period Weeks   Status New               Plan - 07-27-2016 1527    Clinical Impression Statement [facility] presents for eval without pain but concern for difficulty keeping erect posture. She has significant postureal changes and stiffness in spine/neck. Previous abdominal muscle flap for breast cancer  give sway back posture.    Clinical Presentation Stable   Clinical Decision Making Low   Rehab Potential Good   PT Frequency 2x / week   PT Duration 4 weeks   PT Treatment/Interventions Cryotherapy;Moist Heat;Passive range of motion;Patient/family education;Manual techniques;Therapeutic exercise   PT Next Visit Plan Review HEP and progress with upper back strength , decompression activity, manual (osteoporosis noted)   PT Home Exercise Plan standing posture  correction   Consulted and Agree with Plan of Care Patient      Patient will benefit from skilled therapeutic intervention in order to improve the following deficits and impairments:  Postural dysfunction, Decreased strength, Decreased range of motion, Impaired flexibility  Visit Diagnosis: Postural kyphosis of cervicothoracic region  Abnormal posture  Muscle weakness (generalized)       G-Codes - Jul 27, 2016 1536    Functional Assessment Tool Used (Outpatient Only) clinical judgement   Functional Limitation Changing and maintaining body position   Changing and Maintaining Body Position Current Status (U3149) At least 40 percent but less than 60 percent impaired, limited or restricted   Changing and Maintaining Body Position Goal Status (F0263) At least 20 percent but less than 40 percent impaired, limited or restricted      Problem List Patient Active Problem List   Diagnosis Date Noted  . 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 Memory changes 12/04/2015   . 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 Word finding difficulty 09/14/2015  . Lack of coordination 09/14/2015  . Urinary frequency 09/14/2015  . 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 Hyperglycemia 09/14/2015  . Vegetarian diet 09/14/2015    Darrel Hoover  PT 07/07/2016, 3:39 PM  [facility] [address] [place], [place], [address] Phone: [phone]   Fax:  [phone]  Name: Yolanda Robbins MRN: [id number] Date of Birth: 1940/11/15

## 2016-07-18 ENCOUNTER — Ambulatory Visit: Payer: Medicare HMO | Admitting: Obstetrics and Gynecology

## 2016-07-18 ENCOUNTER — Encounter: Payer: Self-pay | Admitting: Obstetrics and Gynecology

## 2016-07-18 ENCOUNTER — Telehealth: Payer: Self-pay

## 2016-07-18 ENCOUNTER — Ambulatory Visit: Payer: Medicare HMO

## 2016-07-18 VITALS — BP 122/70 | HR 84 | Resp 16 | Ht 67.75 in | Wt 135.0 lb

## 2016-07-18 DIAGNOSIS — N816 Rectocele: Secondary | ICD-10-CM

## 2016-07-18 DIAGNOSIS — Z01411 Encounter for gynecological examination (general) (routine) with abnormal findings: Secondary | ICD-10-CM | POA: Diagnosis not present

## 2016-07-18 DIAGNOSIS — R3915 Urgency of urination: Secondary | ICD-10-CM | POA: Diagnosis not present

## 2016-07-18 NOTE — Telephone Encounter (Signed)
Spoke with Yolanda Robbins and she reported she arrived today thinking appointment was at 330 though we do not have appointments at 330 . Appointments  Are at 3, 345, 430. She was rescheduled for 6/19 at 4:30. [name]

## 2016-07-18 NOTE — Progress Notes (Signed)
GYNECOLOGY  VISIT   HPI: 76 y.o.   Widowed  Caucasian  female   No obstetric history on file. with No LMP recorded. Patient is postmenopausal.   here for  Vaginal prolapse. Symptoms for years.   Developing urgency and frequency.  No leakage of urine unless has a strong urge. Took medication in past.    Has done PT for urgency.  Maybe went to [facility].  Feels it was helpful.  DF - not able to discern well.  Maybe goes 3 - 4 times per day.  NF - 2 times. No enuresis.   No leak with laugh, sneeze or cough.  This occurred in the past but not now.   Can leak when she stands up.  No leak when she goes to the gym. Does water aerobics and feels like urgency in the water.  Voids well.   Wears a pad.   No constipation.  Eats a lot of fiber. No fecal incontinence.   Can feel and see a bulge vaginally sometimes. Has pushed it back up.   Not sexually active.  Has one daughter via vaginal delivery.  No difficult delivery.   Had hysterectomy with removal of tubes and ovaries?  [place]? [place] Allegra?  Patient not really certain.  Had surgery due to an ovarian cyst.  Pathology benign per patient.   Unable to give a urine sample today.   Referred by Dr. Hollace Kinnier (PCP)  GYNECOLOGIC HISTORY: No LMP recorded. Patient is postmenopausal. Contraception:  Postmenopausal -- Ovaries have been removed per patient Menopausal hormone therapy: none Last mammogram:  Bilateral Mastectomy done in 1966 per patient  Last pap smear: Years ago per patient        OB History    Gravida Para Term Preterm AB Living   1 1 1  0 0 1   SAB TAB Ectopic Multiple Live Births   0 0 0 0 0         Patient Active Problem List   Diagnosis Date Noted  . 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 Memory changes 12/04/2015  . 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 Word finding difficulty  09/14/2015  . Lack of coordination 09/14/2015  . Urinary frequency 09/14/2015  . 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 Hyperglycemia 09/14/2015  . Vegetarian diet 09/14/2015    Past Medical History:  Diagnosis Date  . Anxiety   . Cancer [facility])    Breast/ bilateral mastectomy  . 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 Osteoporosis     Past Surgical History:  Procedure Laterality Date  . BREAST SURGERY    . FOOT SURGERY    . HYSTERECTOMY ABDOMINAL WITH SALPINGECTOMY  2010  . MASTECTOMY    . NASAL SEPTUM SURGERY    . TONSILLECTOMY AND ADENOIDECTOMY    . TUBAL LIGATION      Current Outpatient Prescriptions  Medication Sig Dispense Refill  . ADK 5000-5000-500 UNIT-MCG CAPS Take by mouth daily.    [name] b complex vitamins tablet Take 1 tablet by mouth daily.    . citalopram (CELEXA) 10 MG tablet Take 10 mg by mouth daily.    . Coenzyme Q10 (COQ10 PO) Take by mouth.    . Lactobacillus (ACIDOPHILUS PO) Take by mouth daily.    . Nutritional Supplements (JUICE PLUS FIBRE PO) Take 1 tablet by mouth daily.    . Selenium 200 MCG CAPS Take by mouth daily.    [name]  TURMERIC PO Take 1 tablet by mouth daily.    Francia Greaves THYROID 30 MG tablet Take 30 mg by mouth daily before breakfast.     . BIOTIN PO Take 2,000 mcg by mouth daily.    . Calcium Carb-Cholecalciferol (CALCIUM 1000 + D PO) Take by mouth.    . Multiple Vitamins-Minerals (OSTEO COMPLEX PO) Take 2 tablets by mouth 2 (two) times daily.     Current Facility-Administered Medications  Medication Dose Route Frequency Provider Last Rate Last Dose  . 0.9 %  sodium chloride infusion  500 mL Intravenous Continuous Irene Shipper, MD         ALLERGIES: Patient has no known allergies.  Family History  Problem Relation Age of Onset  . Adopted: Yes  . Osteoporosis Mother   . Stroke Mother   . Colon cancer Neg Hx     Social History   Social History  . Marital status: Widowed    Spouse name: N/A  . Number of children: N/A   . Years of education: N/A   Occupational History  . Not on file.   Social History Main Topics  . Smoking status: Never Smoker  . Smokeless tobacco: Never Used  . Alcohol use No  . Drug use: No  . Sexual activity: No     Comment: Tubal ligation   Other Topics Concern  . Not on file   Social History Narrative  . No narrative on file    ROS:  Pertinent items are noted in HPI.  PHYSICAL EXAMINATION:    BP 122/70 (BP Location: Right Arm, Patient Position: Sitting, Cuff Size: Normal)   Pulse 84   Resp 16   Ht 5' 7.75" (1.721 m)   Wt 135 lb (61.2 kg)   BMI 20.68 kg/m     General appearance: alert, cooperative and appears stated age Head: Normocephalic, without obvious abnormality, atraumatic Lungs: clear to auscultation bilaterally Heart: regular rate and rhythm Abdomen: soft, non-tender, no masses,  no organomegaly Extremities: extremities normal, atraumatic, no cyanosis or edema Skin: Skin color, texture, turgor normal. No rashes or lesions No abnormal inguinal nodes palpated Neurologic: Grossly normal  Pelvic: External genitalia:  no lesions              Urethra:  normal appearing urethra with no masses, tenderness or lesions              Bartholins and Skenes: normal                 Vagina: normal appearing vagina with normal color and discharge, no lesions. Third degree rectocele.               Cervix: no lesions                Bimanual Exam:  Uterus:  normal size, contour, position, consistency, mobility, non-tender              Adnexa: no mass, fullness, tenderness              Rectal exam: Yes.  .  Confirms.              Anus:  normal sphincter tone, no lesions  Chaperone was present for exam.  ASSESSMENT  Status post abdominal hysterectomy with salpingo-oophorectomy? Cervix present today on examination.  Hx breast cancer.  Memory patient.  Urinary urgency. Rectocele.  PLAN  Return for pelvic ultrasound to define anatomy.  Has third degree  rectocele and I am recommending  a pessary fitting which I intend to do at her ultrasound appointment.  I am not included to recommend surgery at this time. If we treat her urinary frequency with an Rx, I am in favor of Myrbetriq. She would first need a catheterization to check a PVR and check a urinalysis.   An After Visit Summary was printed and given to the patient.  __30____ minutes face to face time of which over 50% was spent in counseling.

## 2016-07-18 NOTE — Progress Notes (Signed)
GYNECOLOGY  VISIT   HPI: 76 y.o.   Widowed  Caucasian  female   G1P1001 with No LMP recorded. Patient is postmenopausal.   here for   Ultrasound to define pelvic anatomy and have pessary fitting.   Recent office visit for prolapse.  Rectocele noted.  Patient's cervix noted and she thought she had a prior hysterectomy.   Has urinary frequency and urgency.  Was unable to give a urine specimen on the day of her prolapse consultation.  She denies dysuria.  Declines medication for overactive bladder.  Did PT in the past and this helped but she forgets to do her exercises.   Thinks she had a trial of a pessary in the past with Dr. Sander Radon, and this was not successful.  She does not remember why.   GYNECOLOGIC HISTORY: No LMP recorded. Patient is postmenopausal. Contraception:  Postmenopausal -- Hysterectomy Menopausal hormone therapy:  none Last mammogram:  Bilateral Mastectomy done in 1966 per patient Last pap smear:   Years ago per patient        OB History    Gravida Para Term Preterm AB Living   1 1 1  0 0 1   SAB TAB Ectopic Multiple Live Births   0 0 0 0 0         Patient Active Problem List   Diagnosis Date Noted  . 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 Memory changes 12/04/2015  . 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 Word finding difficulty 09/14/2015  . Lack of coordination 09/14/2015  . Urinary frequency 09/14/2015  . 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 Hyperglycemia 09/14/2015  . Vegetarian diet 09/14/2015    Past Medical History:  Diagnosis Date  . Anxiety   . Cancer [facility])    Breast/ bilateral mastectomy  . 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 Osteoporosis     Past Surgical History:  Procedure Laterality Date  . BREAST SURGERY    . FOOT SURGERY    . HYSTERECTOMY ABDOMINAL WITH SALPINGECTOMY  2010   Patient has a uterus on pelvic  ultrasound 07/21/16 - 3 small fibroids. No ovaries identified.  [name] MASTECTOMY    . NASAL SEPTUM SURGERY    . TONSILLECTOMY AND ADENOIDECTOMY    . TUBAL LIGATION      Current Outpatient Prescriptions  Medication Sig Dispense Refill  . ADK 5000-5000-500 UNIT-MCG CAPS Take by mouth daily.    Francia Greaves THYROID 30 MG tablet Take 30 mg by mouth daily before breakfast.     . b complex vitamins tablet Take 1 tablet by mouth daily.    [name] BIOTIN PO Take 2,000 mcg by mouth daily.    . Calcium Carb-Cholecalciferol (CALCIUM 1000 + D PO) Take by mouth.    . Coenzyme Q10 (COQ10 PO) Take by mouth.    . Lactobacillus (ACIDOPHILUS PO) Take by mouth daily.    . Multiple Vitamins-Minerals (OSTEO COMPLEX PO) Take 2 tablets by mouth 2 (two) times daily.    . Nutritional Supplements (JUICE PLUS FIBRE PO) Take 1 tablet by mouth daily.    . Selenium 200 MCG CAPS Take by mouth daily.    . TURMERIC PO Take 1 tablet by mouth daily.     Current Facility-Administered Medications  Medication Dose Route Frequency Provider Last Rate Last Dose  . 0.9 %  sodium chloride infusion  500 mL Intravenous Continuous Irene Shipper, MD  ALLERGIES: Patient has no known allergies.  Family History  Problem Relation Age of Onset  . Adopted: Yes  . Osteoporosis Mother   . Stroke Mother   . Colon cancer Neg Hx     Social History   Social History  . Marital status: Widowed    Spouse name: N/A  . Number of children: N/A  . Years of education: N/A   Occupational History  . Not on file.   Social History Main Topics  . Smoking status: Never Smoker  . Smokeless tobacco: Never Used  . Alcohol use No  . Drug use: No  . Sexual activity: No     Comment: Tubal ligation   Other Topics Concern  . Not on file   Social History Narrative  . No narrative on file    ROS:  Pertinent items are noted in HPI.  PHYSICAL EXAMINATION:    BP 112/62 (BP Location: Right Arm, Patient Position: Sitting, Cuff Size: Normal)    Pulse 72   Resp 14   Wt 135 lb (61.2 kg)   BMI 20.68 kg/m     General appearance: alert, cooperative and appears stated age   Pelvic: External genitalia:  no lesions              Urethra:  normal appearing urethra with no masses, tenderness or lesions              Bartholins and Skenes: normal                 Vagina: normal appearing vagina with normal color and discharge, no lesions              Cervix: no lesions                Bimanual Exam:  Uterus:  normal size, contour, position, consistency, mobility, non-tender              Adnexa: no mass, fullness, tenderness            Pessary fitting:  #3 right with support pessary comfortable and patient able to do maneuvers and maintain pessary.  #2 ring with support pessary too small.  #2 incontinence dish too small.   Pelvic ultrasound: Uterus with 3 small fibroids about 1 cm.  EMS 2.04 mm.  Ovaries absent.  No adnexal masses.  Chaperone was present for exam.  ASSESSMENT  Urinary frequency and urgency.   Declines medication.  Rectocele.  Fibroids.  Ovaries absent on ultrasound.   PLAN  Discussed fibroids and pelvic anatomy. Premier #3 ring with support - lot number [id number] to patient.  Declines rx for overactive bladder.  Discussed bladder irritants.  Return in 7 - 8 days for a pessary check.  She will void prior to leaving the office and have a urine dip.  (She was able to void but not give a sample for urine dip.  She is not symptomatic with dysuria so I will not pursue checking this at this time.)   An After Visit Summary was printed and given to the patient.  __25____ minutes face to face time of which over 50% was spent in counseling.

## 2016-07-19 ENCOUNTER — Ambulatory Visit: Payer: Medicare HMO | Admitting: Physical Therapy

## 2016-07-19 DIAGNOSIS — R2681 Unsteadiness on feet: Secondary | ICD-10-CM | POA: Diagnosis not present

## 2016-07-19 DIAGNOSIS — R269 Unspecified abnormalities of gait and mobility: Secondary | ICD-10-CM | POA: Diagnosis not present

## 2016-07-19 DIAGNOSIS — R296 Repeated falls: Secondary | ICD-10-CM | POA: Diagnosis not present

## 2016-07-19 DIAGNOSIS — M6281 Muscle weakness (generalized): Secondary | ICD-10-CM | POA: Diagnosis not present

## 2016-07-19 DIAGNOSIS — R2689 Other abnormalities of gait and mobility: Secondary | ICD-10-CM | POA: Diagnosis not present

## 2016-07-19 DIAGNOSIS — M4003 Postural kyphosis, cervicothoracic region: Secondary | ICD-10-CM

## 2016-07-19 DIAGNOSIS — R293 Abnormal posture: Secondary | ICD-10-CM | POA: Diagnosis not present

## 2016-07-20 ENCOUNTER — Encounter: Payer: Self-pay | Admitting: Physical Therapy

## 2016-07-20 ENCOUNTER — Ambulatory Visit: Payer: Medicare HMO | Admitting: Physical Therapy

## 2016-07-20 DIAGNOSIS — R293 Abnormal posture: Secondary | ICD-10-CM | POA: Diagnosis not present

## 2016-07-20 DIAGNOSIS — R2689 Other abnormalities of gait and mobility: Secondary | ICD-10-CM | POA: Diagnosis not present

## 2016-07-20 DIAGNOSIS — R296 Repeated falls: Secondary | ICD-10-CM | POA: Diagnosis not present

## 2016-07-20 DIAGNOSIS — R2681 Unsteadiness on feet: Secondary | ICD-10-CM | POA: Diagnosis not present

## 2016-07-20 DIAGNOSIS — M4003 Postural kyphosis, cervicothoracic region: Secondary | ICD-10-CM

## 2016-07-20 DIAGNOSIS — R269 Unspecified abnormalities of gait and mobility: Secondary | ICD-10-CM | POA: Diagnosis not present

## 2016-07-20 DIAGNOSIS — M6281 Muscle weakness (generalized): Secondary | ICD-10-CM

## 2016-07-20 NOTE — Therapy (Signed)
[place], [place], [address] Phone: [phone]   Fax:  [phone]  Physical Therapy Treatment  Patient Details  Name: Yolanda Robbins MRN: [id number] Date of Birth: 1940-07-09 Referring Provider: Hollace Kinnier  Encounter Date: 07/20/2016      PT End of Session - 07/20/16 1436    Activity Tolerance Patient tolerated treatment well   Behavior During Therapy [facility] for tasks assessed/performed      Past Medical History:  Diagnosis Date  . Anxiety   . Cancer [facility])    Breast/ bilateral mastectomy  . 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 Osteoporosis     Past Surgical History:  Procedure Laterality Date  . BREAST SURGERY    . FOOT SURGERY    . HYSTERECTOMY ABDOMINAL WITH SALPINGECTOMY  2010  . MASTECTOMY    . NASAL SEPTUM SURGERY    . TONSILLECTOMY AND ADENOIDECTOMY    . TUBAL LIGATION      There were no vitals filed for this visit.      Subjective Assessment - 07/20/16 1352    Subjective Feeling about thew same as yesterday.  Not sure if doing thre exercises right.   Currently in Pain? Yes                         [facility] PT Treatment/Exercise - 07/20/16 0001      Exercises   Other Exercises  Reviewed different types of throacic extension activity: Seated back extension over the chair x10 cavitation noted; Supine lying on a towel 2 min; Prone press-ups required max cuing but had difficulty.  Open book x10 bilateral      Shoulder Exercises: Standing   Extension Limitations 2x10 red    Row Limitations 2x10 red      Above exercises from previous appointment.  Today's exercises : Decompression.  Cues  3 minutes  Added HEP Head press 5 x 5 seconds, 1 pillow and 1 rolled towel for neck Shoulder press 5 x 5 seconds Leg lengthener 5 x 5 seconds  HEP new Leg press 5 x 5 seconds  New HEP Bridge 10 x  arm opener 5 x each( moderate cues  Moves arms well  beyond visual field) Good morning stretch 5 X 5 seconds.  Gluteal squeezes 5 x 5 seconds.   All exercises required cues.           PT Education - 07/20/16 1433    Education provided Yes   Education Details HEP   Person(s) Educated Patient   Methods Explanation   Comprehension Verbalized understanding;Returned demonstration;Need further instruction          PT Short Term Goals - 07/20/16 1439      PT SHORT TERM GOAL #1   Title She will be independent with inital HEP   Baseline required cues   Time 3   Period Weeks   Status On-going           PT Long Term Goals - 07/07/16 1532      PT LONG TERM GOAL #1   Title She will be independent with all HEP issued    Time 6   Period Weeks   Status New     PT LONG TERM GOAL #2   Title She will report ability to hold head and trunk erect improved 50%    Time 12   Period Weeks   Status New  Plan - 07/20/16 1436    Clinical Impression Statement   Exercises issued last visit require cues and instruction for correct technique.  She has mild discomfort with decompresson exercises during leg press and lengthener .  This was eliminated with the use of a pillow under each leg.   PT Next Visit Plan Continue to review HEP,  Consider red band for supine scapular stabilization   PT Home Exercise Plan standing posture  correction,  decompression, arm opener,     Consulted and Agree with Plan of Care Patient      Patient will benefit from skilled therapeutic intervention in order to improve the following deficits and impairments:  Postural dysfunction, Decreased strength, Decreased range of motion, Impaired flexibility  Visit Diagnosis: Postural kyphosis of cervicothoracic region  Abnormal posture  Muscle weakness (generalized)     Problem List Patient Active Problem List   Diagnosis Date Noted  . 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 Mixed obsessional  thoughts and acts 07/05/2016  . Memory changes 12/04/2015  . 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 Word finding difficulty 09/14/2015  . Lack of coordination 09/14/2015  . Urinary frequency 09/14/2015  . 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 Hyperglycemia 09/14/2015  . Vegetarian diet 09/14/2015    HARRIS,KAREN PTA 07/20/2016, 2:41 PM  [facility] [address] [place], [place], [address] Phone: [phone]   Fax:  [phone]  Name: Yolanda Robbins MRN: [id number] Date of Birth: [birth year]

## 2016-07-20 NOTE — Therapy (Signed)
[place], [place], [address] Phone: [phone]   Fax:  [phone]  Physical Therapy Treatment  Patient Details  Name: Yolanda Robbins MRN: [id number] Date of Birth: [birth year] Referring Provider: Hollace Kinnier  Encounter Date: 07/19/2016      PT End of Session - 07/20/16 0807    Visit Number 2   Number of Visits 12   Date for PT Re-Evaluation 08/19/16   Authorization Type [employer] MCR   PT Start Time 9179   PT Stop Time 1728   PT Time Calculation (min) 39 min   Activity Tolerance Patient tolerated treatment well;No increased pain   Behavior During Therapy WFL for tasks assessed/performed      Past Medical History:  Diagnosis Date  . Anxiety   . Cancer [facility])    Breast/ bilateral mastectomy  . 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 Osteoporosis     Past Surgical History:  Procedure Laterality Date  . BREAST SURGERY    . FOOT SURGERY    . HYSTERECTOMY ABDOMINAL WITH SALPINGECTOMY  2010  . MASTECTOMY    . NASAL SEPTUM SURGERY    . TONSILLECTOMY AND ADENOIDECTOMY    . TUBAL LIGATION      There were no vitals filed for this visit.      Subjective Assessment - 07/19/16 1701    Subjective Patient reports that she slept in a different bed and is having some lumbar spine pain at this time. Her car did not start so she is running behind.    Pertinent History Masectomy with abdominal muscle graft   How long can you sit comfortably? as needed   How long can you stand comfortably? as needed                         [facility] Adult PT Treatment/Exercise - 07/20/16 0001      Exercises   Other Exercises  Reviewed different types of throacic extension activity: Seated back extension over the chair x10 cavitation noted; Supine lying on a towel 2 min; Prone press-ups required max cuing but had difficulty.  Open book x10 bilateral      Shoulder Exercises: Standing   Extension Limitations 2x10 red    Row Limitations 2x10 red                 PT Education - 07/20/16 0757    Education provided Yes   Education Details updated HEP; educated the patient on the importance of increasing thoracic mobility   Person(s) Educated Patient   Methods Explanation;Demonstration;Tactile cues;Verbal cues   Comprehension Verbalized understanding;Returned demonstration          PT Short Term Goals - 07/07/16 1532      PT SHORT TERM GOAL #1   Title She will be independent with inital HEP   Time 3   Period Weeks   Status New           PT Long Term Goals - 07/07/16 1532      PT LONG TERM GOAL #1   Title She will be independent with all HEP issued    Time 6   Period Weeks   Status New     PT LONG TERM GOAL #2   Title She will report ability to hold head and trunk erect improved 50%    Time 12   Period Weeks   Status New  Plan - 07/20/16 0816    Clinical Impression Statement Patient toleratedtreatment well. Therapy reviewed several different ways for her to perfrom throacic mobility. She did the best with extension over a chair but she is not sure if she has the right chair at home. Therapy also gave her postrerior chain strengthening exercises.    Clinical Presentation Stable   Clinical Decision Making Low   Rehab Potential Good   PT Frequency 2x / week   PT Duration 8 weeks   PT Treatment/Interventions Cryotherapy;Moist Heat;Passive range of motion;Patient/family education;Manual techniques;Therapeutic exercise   PT Next Visit Plan Review HEP and progress with upper back strength , decompression activity, manual (osteoporosis noted); review HEP; cosider quadruped stabilization exercises if she tolwerated other exercises well; review chin tucks.    PT Home Exercise Plan standing posture  correction   Consulted and Agree with Plan of Care Patient      Patient will benefit from skilled therapeutic intervention in order  to improve the following deficits and impairments:  Postural dysfunction, Decreased strength, Decreased range of motion, Impaired flexibility  Visit Diagnosis: Postural kyphosis of cervicothoracic region  Abnormal posture  Muscle weakness (generalized)     Problem List Patient Active Problem List   Diagnosis Date Noted  . 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 Memory changes 12/04/2015  . 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 Word finding difficulty 09/14/2015  . Lack of coordination 09/14/2015  . Urinary frequency 09/14/2015  . 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 Hyperglycemia 09/14/2015  . Vegetarian diet 09/14/2015    Carney Living PT DPT  07/20/2016, 8:26 AM  [facility] [address] [place], [place], [address] Phone: [phone]   Fax:  [phone]  Name: Yolanda Robbins MRN: [id number] Date of Birth: 29-Feb-1940

## 2016-07-21 ENCOUNTER — Encounter: Payer: Self-pay | Admitting: Obstetrics and Gynecology

## 2016-07-21 ENCOUNTER — Ambulatory Visit (INDEPENDENT_AMBULATORY_CARE_PROVIDER_SITE_OTHER): Payer: Medicare HMO | Admitting: Obstetrics and Gynecology

## 2016-07-21 ENCOUNTER — Ambulatory Visit (INDEPENDENT_AMBULATORY_CARE_PROVIDER_SITE_OTHER): Payer: Medicare HMO

## 2016-07-21 VITALS — BP 112/62 | HR 72 | Resp 14 | Wt 135.0 lb

## 2016-07-21 DIAGNOSIS — D259 Leiomyoma of uterus, unspecified: Secondary | ICD-10-CM | POA: Diagnosis not present

## 2016-07-21 DIAGNOSIS — N816 Rectocele: Secondary | ICD-10-CM

## 2016-07-21 DIAGNOSIS — Z01411 Encounter for gynecological examination (general) (routine) with abnormal findings: Secondary | ICD-10-CM

## 2016-07-25 ENCOUNTER — Ambulatory Visit: Payer: Medicare HMO

## 2016-07-25 DIAGNOSIS — M4003 Postural kyphosis, cervicothoracic region: Secondary | ICD-10-CM | POA: Diagnosis not present

## 2016-07-25 DIAGNOSIS — M6281 Muscle weakness (generalized): Secondary | ICD-10-CM

## 2016-07-25 DIAGNOSIS — R269 Unspecified abnormalities of gait and mobility: Secondary | ICD-10-CM | POA: Diagnosis not present

## 2016-07-25 DIAGNOSIS — R296 Repeated falls: Secondary | ICD-10-CM | POA: Diagnosis not present

## 2016-07-25 DIAGNOSIS — R2681 Unsteadiness on feet: Secondary | ICD-10-CM | POA: Diagnosis not present

## 2016-07-25 DIAGNOSIS — R293 Abnormal posture: Secondary | ICD-10-CM

## 2016-07-25 DIAGNOSIS — R2689 Other abnormalities of gait and mobility: Secondary | ICD-10-CM | POA: Diagnosis not present

## 2016-07-25 NOTE — Patient Instructions (Signed)
Issued from cabinet prayer stretch for ext stretching in Thoracic spine 04-01-18 sec 15 reps  1-2x/day .   And from Pilates book swimmer with arm lifts 1-2x/day 10-15 reps single or double arm lifts

## 2016-07-25 NOTE — Therapy (Signed)
[place], [place], [address] Phone: [phone]   Fax:  [phone]  Physical Therapy Treatment  Patient Details  Name: Yolanda Robbins MRN: [id number] Date of Birth: [birth year] Referring Provider: Hollace Kinnier  Encounter Date: 07/25/2016      PT End of Session - 07/25/16 1327    Visit Number 4   Number of Visits 12   Date for PT Re-Evaluation 08/19/16   Authorization Type [employer] MCR   PT Start Time 1330   PT Stop Time 1414   PT Time Calculation (min) 44 min   Activity Tolerance Patient tolerated treatment well   Behavior During Therapy [facility] for tasks assessed/performed      Past Medical History:  Diagnosis Date  . Anxiety   . Cancer [facility])    Breast/ bilateral mastectomy  . 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 Osteoporosis     Past Surgical History:  Procedure Laterality Date  . BREAST SURGERY    . FOOT SURGERY    . HYSTERECTOMY ABDOMINAL WITH SALPINGECTOMY  2010   Patient has a uterus on pelvic ultrasound 07/21/16 - 3 small fibroids. No ovaries identified.  [name] MASTECTOMY    . NASAL SEPTUM SURGERY    . TONSILLECTOMY AND ADENOIDECTOMY    . TUBAL LIGATION      There were no vitals filed for this visit.      Subjective Assessment - 07/25/16 1328    Subjective No pain   Currently in Pain? No/denies                         [facility] Adult PT Treatment/Exercise - 07/25/16 0001      Lumbar Exercises: Seated   Other Seated Lumbar Exercises Thoracic extension x 10 reps cued for time and reps      Shoulder Exercises: Standing   External Rotation Strengthening;Both;15 reps   Extension Limitations 2x10 red    Row Limitations 2x10 red      Reviewed all HEP and she was able to do correct post initiating comment/cues from PT.   We did all new HEP and green band was issued.          PT Education - 07/25/16 1429    Education provided Yes   Education  Details HEP   Person(s) Educated Patient   Methods Explanation;Demonstration;Tactile cues;Verbal cues;Handout   Comprehension Returned demonstration;Verbalized understanding          PT Short Term Goals - 07/20/16 1439      PT SHORT TERM GOAL #1   Title She will be independent with inital HEP   Baseline required cues   Time 3   Period Weeks   Status On-going           PT Long Term Goals - 07/07/16 1532      PT LONG TERM GOAL #1   Title She will be independent with all HEP issued    Time 6   Period Weeks   Status New     PT LONG TERM GOAL #2   Title She will report ability to hold head and trunk erect improved 50%    Time 12   Period Weeks   Status New               Plan - 07/25/16 1326    Clinical Impression Statement Still needing cues but doing corectly after this . She is too worried about getting the exercise  exactly correct but I suggested she not worry about this and work on smoothness with movement  for now.    PT Treatment/Interventions Cryotherapy;Moist Heat;Passive range of motion;Patient/family education;Manual techniques;Therapeutic exercise   PT Next Visit Plan Continue to review HEP,  Consider red band for supine scapular stabilization   PT Home Exercise Plan standing posture  correction,  decompression, arm opener,     Consulted and Agree with Plan of Care Patient      Patient will benefit from skilled therapeutic intervention in order to improve the following deficits and impairments:  Postural dysfunction, Decreased strength, Decreased range of motion, Impaired flexibility  Visit Diagnosis: Postural kyphosis of cervicothoracic region  Abnormal posture  Muscle weakness (generalized)     Problem List Patient Active Problem List   Diagnosis Date Noted  . 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 Memory changes 12/04/2015  . Multiple  adenomatous polyps 11/02/2015  . Word finding difficulty 09/14/2015  . Lack of coordination 09/14/2015  . Urinary frequency 09/14/2015  . 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 Hyperglycemia 09/14/2015  . Vegetarian diet 09/14/2015    Darrel Hoover  PT 07/25/2016, 2:31 PM  [facility] [address] [place], [place], [address] Phone: [phone]   Fax:  [phone]  Name: Yolanda Robbins MRN: [id number] Date of Birth: [birth year]

## 2016-07-27 ENCOUNTER — Ambulatory Visit: Payer: Medicare HMO | Admitting: Physical Therapy

## 2016-07-27 ENCOUNTER — Encounter: Payer: Self-pay | Admitting: Physical Therapy

## 2016-07-27 DIAGNOSIS — R2681 Unsteadiness on feet: Secondary | ICD-10-CM | POA: Diagnosis not present

## 2016-07-27 DIAGNOSIS — R296 Repeated falls: Secondary | ICD-10-CM | POA: Diagnosis not present

## 2016-07-27 DIAGNOSIS — R269 Unspecified abnormalities of gait and mobility: Secondary | ICD-10-CM

## 2016-07-27 DIAGNOSIS — M6281 Muscle weakness (generalized): Secondary | ICD-10-CM | POA: Diagnosis not present

## 2016-07-27 DIAGNOSIS — M4003 Postural kyphosis, cervicothoracic region: Secondary | ICD-10-CM | POA: Diagnosis not present

## 2016-07-27 DIAGNOSIS — R2689 Other abnormalities of gait and mobility: Secondary | ICD-10-CM | POA: Diagnosis not present

## 2016-07-27 DIAGNOSIS — R293 Abnormal posture: Secondary | ICD-10-CM

## 2016-07-27 NOTE — Therapy (Signed)
[place], [place], [address] Phone: [phone]   Fax:  [phone]  Physical Therapy Treatment  Patient Details  Name: Yolanda Robbins MRN: [id number] Date of Birth: [birth year] Referring Provider: Hollace Kinnier  Encounter Date: 07/27/2016      PT End of Session - 07/27/16 1755    Visit Number 5   Number of Visits 12   Date for PT Re-Evaluation 08/19/16   PT Start Time 1503   PT Stop Time 1545   PT Time Calculation (min) 42 min   Activity Tolerance Patient tolerated treatment well   Behavior During Therapy [facility] for tasks assessed/performed      Past Medical History:  Diagnosis Date  . Anxiety   . Cancer [facility])    Breast/ bilateral mastectomy  . 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 Osteoporosis     Past Surgical History:  Procedure Laterality Date  . BREAST SURGERY    . FOOT SURGERY    . HYSTERECTOMY ABDOMINAL WITH SALPINGECTOMY  2010   Patient has a uterus on pelvic ultrasound 07/21/16 - 3 small fibroids. No ovaries identified.  [name] MASTECTOMY    . NASAL SEPTUM SURGERY    . TONSILLECTOMY AND ADENOIDECTOMY    . TUBAL LIGATION      There were no vitals filed for this visit.      Subjective Assessment - 07/27/16 1747    Subjective NO pain.     Currently in Pain? No/denies                         [facility] Adult PT Treatment/Exercise - 07/27/16 0001      Exercises   Other Exercises  explained exercises that help osteoporosis.  She was under the belief that exercising in water chest deep was good for her bones.  I explained why it was not.      Lumbar Exercises: Stretches   Prone Mid Back Stretch Limitations child's pose, cues for left arm a little longer reach than right     Lumbar Exercises: Sidelying   Other Sidelying Lumbar Exercises arm opener, moderate cues, rolled towel at waist to keep back neutral.  10 x each side.     Lumbar Exercises: Prone   Single Arm Raise 5 reps   Single Arm Raises Limitations heavy cues, needed pillow     Shoulder Exercises: Supine   Other Supine Exercises hooklying over rolled towel.     Other Supine Exercises Diagonals , yellow band. heavy cues, ER green band 15 x,   Horizontal abduction 15 x, green band                PT Education - 07/27/16 1754    Education provided Yes   Education Details HEP   Person(s) Educated Patient   Methods Explanation;Verbal cues;Tactile cues   Comprehension Verbalized understanding;Returned demonstration;Need further instruction          PT Short Term Goals - 07/27/16 1803      PT SHORT TERM GOAL #1   Title She will be independent with inital HEP   Baseline required cues   Time 3   Period Weeks   Status On-going           PT Long Term Goals - 07/07/16 1532      PT LONG TERM GOAL #1   Title She will be independent with all HEP issued    Time 6   Period  Weeks   Status New     PT LONG TERM GOAL #2   Title She will report ability to hold head and trunk erect improved 50%    Time 12   Period Weeks   Status New               Plan - 07/27/16 1755    Clinical Impression Statement Moderate to heavy cues needed for current HEP.  She probable has enought for now.     PT Treatment/Interventions Cryotherapy;Moist Heat;Passive range of motion;Patient/family education;Manual techniques;Therapeutic exercise   PT Next Visit Plan Continue to review HEP, Ostoeporosis info    PT Home Exercise Plan standing posture  correction,  decompression, arm opener,  Supine ER, Diagonals, horizontal abduction   Consulted and Agree with Plan of Care Patient      Patient will benefit from skilled therapeutic intervention in order to improve the following deficits and impairments:  Postural dysfunction, Decreased strength, Decreased range of motion, Impaired flexibility  Visit Diagnosis: Postural kyphosis of cervicothoracic region  Abnormal posture  Muscle  weakness (generalized)  Abnormality of gait  Unsteadiness  Balance problems  Recurrent falls while walking     Problem List Patient Active Problem List   Diagnosis Date Noted  . 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 Memory changes 12/04/2015  . 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 Word finding difficulty 09/14/2015  . Lack of coordination 09/14/2015  . Urinary frequency 09/14/2015  . 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 Hyperglycemia 09/14/2015  . Vegetarian diet 09/14/2015    My [place] PTA 07/27/2016, 6:04 PM  [facility] [address] [place], [place], [address] Phone: [phone]   Fax:  [phone]  Name: CORNISHA ZETINO MRN: [id number] Date of Birth: [birth year]

## 2016-07-28 ENCOUNTER — Encounter: Payer: Self-pay | Admitting: Obstetrics and Gynecology

## 2016-07-28 ENCOUNTER — Ambulatory Visit (INDEPENDENT_AMBULATORY_CARE_PROVIDER_SITE_OTHER): Payer: Medicare HMO | Admitting: Obstetrics and Gynecology

## 2016-07-28 VITALS — BP 110/60 | HR 68 | Resp 16 | Wt 135.0 lb

## 2016-07-28 DIAGNOSIS — N816 Rectocele: Secondary | ICD-10-CM

## 2016-07-28 DIAGNOSIS — R350 Frequency of micturition: Secondary | ICD-10-CM | POA: Diagnosis not present

## 2016-07-28 LAB — POCT URINALYSIS DIPSTICK
BILIRUBIN UA: NEGATIVE
Glucose, UA: NEGATIVE
Ketones, UA: NEGATIVE
NITRITE UA: NEGATIVE
PH UA: 5 (ref 5.0–8.0)
Protein, UA: NEGATIVE
Urobilinogen, UA: 0.2 E.U./dL

## 2016-07-28 NOTE — Patient Instructions (Addendum)
Try Replens, Astroglide, or KY jelly to provide lubrication for use with the pessary when you place and remove it.  I would recommend removing the pessary and cleaning it once a week.   I would use Dove liquid soap to clean the pessary.

## 2016-07-28 NOTE — Progress Notes (Signed)
GYNECOLOGY  VISIT   HPI: 76 y.o.   Widowed  Caucasian  female   G1P1001 with No LMP recorded. Patient is postmenopausal.   here for 1 week pessary check.  Using a #3 ring with support pessary.   On her sanitary pad she has had some discharge. Tiny spotting after pessary placement one week ago. None since then.  Voiding well.  No pelvic pain.  No dysuria. Pessary is not falling out.   Has urinary frequency.  States she voiding has improved since starting to use the pessary.  Urine today: 2+ RBC, 1+ WBC  GYNECOLOGIC HISTORY: No LMP recorded. Patient is postmenopausal. Contraception:  Postmenopausal -- Hysterectomy  Menopausal hormone therapy:  none Last mammogram:  Bilateral Mastectomy done in 1966 per patient Last pap smear:   06/09/2011 Pap smear negative        OB History    Gravida Para Term Preterm AB Living   1 1 1  0 0 1   SAB TAB Ectopic Multiple Live Births   0 0 0 0 0         Patient Active Problem List   Diagnosis Date Noted  . 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 Memory changes 12/04/2015  . 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 Word finding difficulty 09/14/2015  . Lack of coordination 09/14/2015  . Urinary frequency 09/14/2015  . 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 Hyperglycemia 09/14/2015  . Vegetarian diet 09/14/2015    Past Medical History:  Diagnosis Date  . Anxiety   . Cancer [facility])    Breast/ bilateral mastectomy  . 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 Osteoporosis     Past Surgical History:  Procedure Laterality Date  . BREAST SURGERY    . FOOT SURGERY    . HYSTERECTOMY ABDOMINAL WITH SALPINGECTOMY  2010   Patient has a uterus on pelvic ultrasound 07/21/16 - 3 small fibroids. No ovaries identified.  [name] MASTECTOMY    . NASAL SEPTUM SURGERY    . TONSILLECTOMY AND ADENOIDECTOMY    . TUBAL LIGATION      Current Outpatient  Prescriptions  Medication Sig Dispense Refill  . ADK 5000-5000-500 UNIT-MCG CAPS Take by mouth daily.    [name] b complex vitamins tablet Take 1 tablet by mouth daily.    . Coenzyme Q10 (COQ10 PO) Take by mouth.    . Lactobacillus (ACIDOPHILUS PO) Take by mouth daily.    . Nutritional Supplements (JUICE PLUS FIBRE PO) Take 1 tablet by mouth daily.    . Selenium 200 MCG CAPS Take by mouth daily.    . TURMERIC PO Take 1 tablet by mouth daily.    [name] BIOTIN PO Take 2,000 mcg by mouth daily.    . Calcium Carb-Cholecalciferol (CALCIUM 1000 + D PO) Take by mouth.    . Multiple Vitamins-Minerals (OSTEO COMPLEX PO) Take 2 tablets by mouth 2 (two) times daily.     Current Facility-Administered Medications  Medication Dose Route Frequency Provider Last Rate Last Dose  . 0.9 %  sodium chloride infusion  500 mL Intravenous Continuous Irene Shipper, MD         ALLERGIES: Patient has no known allergies.  Family History  Problem Relation Age of Onset  . Adopted: Yes  . Osteoporosis Mother   . Stroke Mother   . Colon cancer Neg Hx     Social History   Social History  . Marital status:  Widowed    Spouse name: N/A  . Number of children: N/A  . Years of education: N/A   Occupational History  . Not on file.   Social History Main Topics  . Smoking status: Never Smoker  . Smokeless tobacco: Never Used  . Alcohol use No  . Drug use: No  . Sexual activity: No     Comment: Tubal ligation   Other Topics Concern  . Not on file   Social History Narrative  . No narrative on file    ROS:  Pertinent items are noted in HPI.  PHYSICAL EXAMINATION:    BP 110/60 (BP Location: Right Arm, Patient Position: Sitting, Cuff Size: Normal)   Pulse 68   Resp 16   Wt 135 lb (61.2 kg)   BMI 20.68 kg/m     General appearance: alert, cooperative and appears stated age   Pelvic: External genitalia:  no lesions              Urethra:  normal appearing urethra with no masses, tenderness or lesions               Bartholins and Skenes: normal                 Vagina: normal appearing vagina with normal color and discharge, no lesions.  Mild erythema of the vaginal vault.  No ulceration.  No frank blood.              Cervix: no lesions                Bimanual Exam:  Uterus:  normal size, contour, position, consistency, mobility, non-tender              Adnexa: no mass, fullness, tenderness         Pessary was removed, cleansed, and replaced.  Patient then was taught to remove and place the pessary herself.   Chaperone was present for exam.  ASSESSMENT  Incomplete uterovaginal prolapse.  Rectocele. Abnormal urine.  Urinary frequency.  Hx breast cancer.  Status post bilateral mastectomy.   PLAN  Continue pessary use.  We reviewed pessary care.  I discussed water based lubricant use with pessary removal weekly.  Not a good candidate for vaginal estrogen use.  Use a mild soap such as Dove to clean the pessary.  Urine micro and culture.  Follow up in 4 weeks for a recheck.   An After Visit Summary was printed and given to the patient.  _25_____ minutes face to face time of which over 50% was spent in counseling.

## 2016-07-29 LAB — URINALYSIS, MICROSCOPIC ONLY
Bacteria, UA: NONE SEEN
CASTS: NONE SEEN /LPF

## 2016-07-29 LAB — URINE CULTURE

## 2016-08-01 ENCOUNTER — Telehealth: Payer: Self-pay | Admitting: Obstetrics and Gynecology

## 2016-08-01 ENCOUNTER — Encounter: Payer: Self-pay | Admitting: Physical Therapy

## 2016-08-01 ENCOUNTER — Ambulatory Visit: Payer: Medicare HMO | Attending: Internal Medicine | Admitting: Physical Therapy

## 2016-08-01 DIAGNOSIS — R269 Unspecified abnormalities of gait and mobility: Secondary | ICD-10-CM | POA: Insufficient documentation

## 2016-08-01 DIAGNOSIS — R293 Abnormal posture: Secondary | ICD-10-CM | POA: Diagnosis not present

## 2016-08-01 DIAGNOSIS — R2681 Unsteadiness on feet: Secondary | ICD-10-CM

## 2016-08-01 DIAGNOSIS — M4003 Postural kyphosis, cervicothoracic region: Secondary | ICD-10-CM | POA: Insufficient documentation

## 2016-08-01 DIAGNOSIS — M6281 Muscle weakness (generalized): Secondary | ICD-10-CM | POA: Insufficient documentation

## 2016-08-01 DIAGNOSIS — R2689 Other abnormalities of gait and mobility: Secondary | ICD-10-CM | POA: Diagnosis not present

## 2016-08-01 DIAGNOSIS — R296 Repeated falls: Secondary | ICD-10-CM | POA: Diagnosis not present

## 2016-08-01 NOTE — Telephone Encounter (Signed)
Notes recorded by Archie Balboa, CMA on 08/01/2016 at 11:34 AM EDT Reviewed in detail the following results. ------  Notes recorded by Nunzio Cobbs, MD on 07/29/2016 at 10:36 PM EDT Please report negative urine micro and culture to patient. I am highlighting this so that you know to call the patient.  Routing to provider for final review. Patient agreeable to disposition. Will close encounter.

## 2016-08-01 NOTE — Therapy (Signed)
[place], [place], [address] Phone: [phone]   Fax:  [phone]  Physical Therapy Treatment  Patient Details  Name: Yolanda Robbins MRN: [id number] Date of Birth: [birth year] Referring Provider: Hollace Kinnier  Encounter Date: 08/01/2016      PT End of Session - 08/01/16 1517    Visit Number 6   Number of Visits 12   Date for PT Re-Evaluation 08/19/16   PT Start Time 6378   PT Stop Time 1500   PT Time Calculation (min) 43 min   Activity Tolerance Patient tolerated treatment well   Behavior During Therapy [facility] for tasks assessed/performed      Past Medical History:  Diagnosis Date  . Anxiety   . Cancer [facility])    Breast/ bilateral mastectomy  . 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 Osteoporosis     Past Surgical History:  Procedure Laterality Date  . BREAST SURGERY    . FOOT SURGERY    . HYSTERECTOMY ABDOMINAL WITH SALPINGECTOMY  2010   Patient has a uterus on pelvic ultrasound 07/21/16 - 3 small fibroids. No ovaries identified.  [name] MASTECTOMY    . NASAL SEPTUM SURGERY    . TONSILLECTOMY AND ADENOIDECTOMY    . TUBAL LIGATION      There were no vitals filed for this visit.      Subjective Assessment - 08/01/16 1434    Subjective No pain,  mild soreness from liftring a case of watyer.   Some questions about her HEP.   Currently in Pain? No/denies                         [facility] Adult PT Treatment/Exercise - 08/01/16 0001      Self-Care   Self-Care ADL's;Lifting;Posture  lifting,  Mirror used,  ADL handout, simulated shopping.   ADL's handout issued,  practiced getting things out of cart   Lifting 8 LB box from mat.  miror used, zNeeds nmore education.   Posture sitting, standing, with ADL's     Exercises   Other Exercises  some time spent organizing her HEP.  She is overwhelmed with the volume and with the exercises themselves,  Exercises organized i in  4 days with Wednesday off.       Lumbar Exercises: Supine   AB Set Limitations 5 breaths. with abdominal   Bent Knee Raise 5 reps   Bent Knee Raise Limitations wobbles, monitored, unable to correct   Bridge 10 reps   Other Supine Lumbar Exercises leg press, leg lengthener 5 X 5 seconds each     Lumbar Exercises: Prone   Single Arm Raise 10 reps   Single Arm Raises Limitations 2 pillows mod cues   Straight Leg Raises Limitations 3 reps, some low back discomfort. , patient had lifted a case of water and her low back was sore.    Other Prone Lumbar Exercises Multifitus press X 5,  press with knee flexion 5 x each over 2 pillows.     Shoulder Exercises: Supine   Other Supine Exercises decompression 5 x 5 seconds, leg press, leg lengthener.                 PT Education - 08/01/16 1508    Education Details How to organize her hEP.  Lifting   Person(s) Educated Patient   Methods Explanation;Demonstration;Tactile cues;Verbal cues;Handout   Comprehension Verbalized understanding;Returned demonstration;Need further instruction  PT Short Term Goals - 08/01/16 1521      PT SHORT TERM GOAL #1   Title She will be independent with inital HEP   Baseline required cues   Time 3   Period Weeks   Status On-going           PT Long Term Goals - 07/07/16 1532      PT LONG TERM GOAL #1   Title She will be independent with all HEP issued    Time 6   Period Weeks   Status New     PT LONG TERM GOAL #2   Title She will report ability to hold head and trunk erect improved 50%    Time 12   Period Weeks   Status New               Plan - 08/01/16 1518    Clinical Impression Statement moderate cues for HEP.  She has been also doing exercises not issued for home, but were doing in clinic.  She has been exercisiing at the end of the day when she was exhausted.  She is overwhelmed.  Exercises arranged so that they are divided in quarters to do 1/4 of them 4 days a  week.  In a week all exercises will be covered.  Lifting ed initiated, however she was unable to apply correct posture with getting things from her purse at the end of the session, even with reminders of correct technique.    PT Treatment/Interventions Cryotherapy;Moist Heat;Passive range of motion;Patient/family education;Manual techniques;Therapeutic exercise   PT Next Visit Plan Continue to review HEP, Ostoeporosis info .  ADL lifting practice.   PT Home Exercise Plan standing posture  correction,  decompression, arm opener,  Supine ER, Diagonals, horizontal abduction   Consulted and Agree with Plan of Care Patient      Patient will benefit from skilled therapeutic intervention in order to improve the following deficits and impairments:  Postural dysfunction, Decreased strength, Decreased range of motion, Impaired flexibility  Visit Diagnosis: Postural kyphosis of cervicothoracic region  Abnormal posture  Muscle weakness (generalized)  Abnormality of gait  Unsteadiness  Balance problems  Recurrent falls while walking     Problem List Patient Active Problem List   Diagnosis Date Noted  . 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 Memory changes 12/04/2015  . 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 Word finding difficulty 09/14/2015  . Lack of coordination 09/14/2015  . Urinary frequency 09/14/2015  . 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 Hyperglycemia 09/14/2015  . Vegetarian diet 09/14/2015    HARRIS,KAREN PTA 08/01/2016, 3:23 PM  [facility] [address] [place], [place], [address] Phone: [phone]   Fax:  [phone]  Name: Yolanda Robbins MRN: [id number] Date of Birth: 1940-09-03

## 2016-08-01 NOTE — Patient Instructions (Signed)

## 2016-08-01 NOTE — Telephone Encounter (Signed)
Patient stated she is returning a call from our office this morning but she is not sure what it is about. There is not open phone note.

## 2016-08-04 ENCOUNTER — Ambulatory Visit: Payer: Medicare HMO

## 2016-08-04 DIAGNOSIS — R269 Unspecified abnormalities of gait and mobility: Secondary | ICD-10-CM | POA: Diagnosis not present

## 2016-08-04 DIAGNOSIS — M6281 Muscle weakness (generalized): Secondary | ICD-10-CM

## 2016-08-04 DIAGNOSIS — R2689 Other abnormalities of gait and mobility: Secondary | ICD-10-CM | POA: Diagnosis not present

## 2016-08-04 DIAGNOSIS — R2681 Unsteadiness on feet: Secondary | ICD-10-CM | POA: Diagnosis not present

## 2016-08-04 DIAGNOSIS — R293 Abnormal posture: Secondary | ICD-10-CM

## 2016-08-04 DIAGNOSIS — R296 Repeated falls: Secondary | ICD-10-CM | POA: Diagnosis not present

## 2016-08-04 DIAGNOSIS — M4003 Postural kyphosis, cervicothoracic region: Secondary | ICD-10-CM

## 2016-08-04 NOTE — Therapy (Signed)
[place], [place], [address] Phone: [phone]   Fax:  [phone]  Physical Therapy Treatment  Patient Details  Name: Yolanda Robbins MRN: [id number] Date of Birth: Jan 31, 1941 Referring Provider: Hollace Kinnier  Encounter Date: 08/04/2016      PT End of Session - 08/04/16 1320    Visit Number 7   Number of Visits 12   Date for PT Re-Evaluation 08/19/16   Authorization Type [employer] MCR   PT Start Time 0120   PT Stop Time 0200   PT Time Calculation (min) 40 min   Activity Tolerance Patient tolerated treatment well   Behavior During Therapy [facility] for tasks assessed/performed      Past Medical History:  Diagnosis Date  . Anxiety   . Cancer [facility])    Breast/ bilateral mastectomy  . 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 Osteoporosis     Past Surgical History:  Procedure Laterality Date  . BREAST SURGERY    . FOOT SURGERY    . HYSTERECTOMY ABDOMINAL WITH SALPINGECTOMY  2010   Patient has a uterus on pelvic ultrasound 07/21/16 - 3 small fibroids. No ovaries identified.  [name] MASTECTOMY    . NASAL SEPTUM SURGERY    . TONSILLECTOMY AND ADENOIDECTOMY    . TUBAL LIGATION      There were no vitals filed for this visit.      Subjective Assessment - 08/04/16 1442    Subjective Did not do exercises as I hasd 2 exercise classes in one day. No pain   Currently in Pain? No/denies                         [facility] Adult PT Treatment/Exercise - 08/04/16 0001      Neck Exercises: Machines for Strengthening   UBE (Upper Arm Bike) L1 4 min forward and 2 min back     Manual Therapy   Manual Therapy --   Manual Traction to asssit with cervical retraction and elongation of neck      Reviewed all HEP and band exercises with green band and stretching . She wa able to do all after initiating from PT             PT Education - 08/04/16 1439    Education provided Yes   Education Details Need to be very consistent with stretching  to gain ong term benefit   Person(s) Educated Patient   Methods Explanation   Comprehension Verbalized understanding          PT Short Term Goals - 08/01/16 1521      PT SHORT TERM GOAL #1   Title She will be independent with inital HEP   Baseline required cues   Time 3   Period Weeks   Status On-going           PT Long Term Goals - 08/04/16 1321      PT LONG TERM GOAL #1   Title She will be independent with all HEP issued    Status On-going     PT LONG TERM GOAL #2   Title She will report ability to hold head and trunk erect improved 50%    Status On-going               Plan - 08/04/16 1321    Clinical Impression Statement She was able to do all HEP with only minor initiating cues.    No pain.  She was able to demo good understanding on good posture though she needed some mino cues for pelvic lower spine alighnment ofer feet in standing..     Chronic thoracic stiffness limits  ROM   PT Treatment/Interventions Cryotherapy;Moist Heat;Passive range of motion;Patient/family education;Manual techniques;Therapeutic exercise   PT Next Visit Plan  Add to HEP as appropriate , Ostoeporosis info .  ADL lifting practice.   PT Home Exercise Plan standing posture  correction,  decompression, arm opener,  Supine ER, Diagonals, horizontal abduction   Consulted and Agree with Plan of Care Patient      Patient will benefit from skilled therapeutic intervention in order to improve the following deficits and impairments:  Postural dysfunction, Decreased strength, Decreased range of motion, Impaired flexibility  Visit Diagnosis: Postural kyphosis of cervicothoracic region  Abnormal posture  Muscle weakness (generalized)  Abnormality of gait     Problem List Patient Active Problem List   Diagnosis Date Noted  . 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 Mixed obsessional  thoughts and acts 07/05/2016  . Memory changes 12/04/2015  . 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 Word finding difficulty 09/14/2015  . Lack of coordination 09/14/2015  . Urinary frequency 09/14/2015  . 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 Hyperglycemia 09/14/2015  . Vegetarian diet 09/14/2015    Darrel Hoover PT 08/04/2016, 2:46 PM  [facility] [address] [place], [place], [address] Phone: [phone]   Fax:  [phone]  Name: Yolanda Robbins MRN: [id number] Date of Birth: [birth year]

## 2016-08-09 ENCOUNTER — Encounter: Payer: Self-pay | Admitting: Physical Therapy

## 2016-08-09 ENCOUNTER — Ambulatory Visit: Payer: Medicare HMO | Admitting: Physical Therapy

## 2016-08-09 DIAGNOSIS — R293 Abnormal posture: Secondary | ICD-10-CM

## 2016-08-09 DIAGNOSIS — M6281 Muscle weakness (generalized): Secondary | ICD-10-CM

## 2016-08-09 DIAGNOSIS — M4003 Postural kyphosis, cervicothoracic region: Secondary | ICD-10-CM | POA: Diagnosis not present

## 2016-08-09 DIAGNOSIS — R296 Repeated falls: Secondary | ICD-10-CM | POA: Diagnosis not present

## 2016-08-09 DIAGNOSIS — R2681 Unsteadiness on feet: Secondary | ICD-10-CM | POA: Diagnosis not present

## 2016-08-09 DIAGNOSIS — R269 Unspecified abnormalities of gait and mobility: Secondary | ICD-10-CM

## 2016-08-09 DIAGNOSIS — R2689 Other abnormalities of gait and mobility: Secondary | ICD-10-CM | POA: Diagnosis not present

## 2016-08-09 NOTE — Therapy (Signed)
Yolanda Robbins, [place], [address] Phone: [phone]   Fax:  [phone]  Physical Therapy Treatment  Patient Details  Name: Yolanda Robbins MRN: [id number] Date of Birth: 12/07/1940 Referring Provider: Hollace Kinnier  Encounter Date: 08/09/2016      PT End of Session - 08/09/16 1641    Visit Number 8   Number of Visits 12   Date for PT Re-Evaluation 08/19/16   PT Start Time 1332   PT Stop Time 1420   PT Time Calculation (min) 48 min   Activity Tolerance Patient tolerated treatment well   Behavior During Therapy [facility] for tasks assessed/performed      Past Medical History:  Diagnosis Date  . Anxiety   . Cancer [facility])    Breast/ bilateral mastectomy  . 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 Osteoporosis     Past Surgical History:  Procedure Laterality Date  . BREAST SURGERY    . FOOT SURGERY    . HYSTERECTOMY ABDOMINAL WITH SALPINGECTOMY  2010   Patient has a uterus on pelvic ultrasound 07/21/16 - 3 small fibroids. No ovaries identified.  [name] MASTECTOMY    . NASAL SEPTUM SURGERY    . TONSILLECTOMY AND ADENOIDECTOMY    . TUBAL LIGATION      There were no vitals filed for this visit.      Subjective Assessment - 08/09/16 1631    Subjective No pain however low back gets a little sore with trying to stand up straigh with walking.  i have i question about the HEP.   Currently in Pain? No/denies   Pain Location Back   Pain Orientation Lower   Pain Descriptors / Indicators Sore   Pain Frequency Intermittent   Aggravating Factors  trying to walk with good posture   Pain Relieving Factors resting                          [facility] Adult PT Treatment/Exercise - 08/09/16 0001      Self-Care   Self-Care ADL's;Posture;Other Self-Care Comments   Posture with standing , sitting, walking   Other Self-Care Comments  Osteoporosis handout issued .  the packet reviewen in its  entireity.   Patient will be able to cahnge a few techniques to be more o     Therapeutic Activites    Therapeutic Activities Other Therapeutic Activities   Other Therapeutic Activities walking simulation, various techniques used to try to improve technique. .  She demonstrates weakness in her abdominal wall and rests with shoulders posterior to hips.  She stabilizes uer upper back with scapular retraction.,  sitting standing posture practiced with cues and a mirror.   practiced YOGA walking     Lumbar Exercises: Seated   Other Seated Lumbar Exercises Sit fit pelvic control,  neutral sitting with reaches, mirror used, cues      Shoulder Exercises: Supine   Other Supine Exercises practiced decompression head posture neutral position.                  PT Education - 08/09/16 1740    Education provided Yes   Education Details Review of current HEP, posture ed, ADL education   Person(s) Educated Patient   Methods Explanation;Demonstration;Tactile cues;Verbal cues;Handout   Comprehension Verbalized understanding;Returned demonstration;Need further instruction          PT Short Term Goals - 08/09/16 1741      PT SHORT TERM GOAL #1  Title She will be independent with inital HEP   Baseline required cues   Time 3   Period Weeks   Status On-going           PT Long Term Goals - 08/04/16 1321      PT LONG TERM GOAL #1   Title She will be independent with all HEP issued    Status On-going     PT LONG TERM GOAL #2   Title She will report ability to hold head and trunk erect improved 50%    Status On-going               Plan - 08/09/16 1641    Clinical Impression Statement Mild low back soreness post session.  Patient declined the need for modalities.   Patient continues to need cues for HEP.  Che continues to need posture education.  Posture supine reveals 2 fingers width from mat, improved from fist with off mat.    PT Treatment/Interventions Cryotherapy;Moist  Heat;Passive range of motion;Patient/family education;Manual techniques;Therapeutic exercise   PT Next Visit Plan   Review Exercises (she has enough) ADL lifting practice.   PT Home Exercise Plan standing posture  correction,  decompression, arm opener,  Supine ER, Diagonals, horizontal abduction   Consulted and Agree with Plan of Care Patient      Patient will benefit from skilled therapeutic intervention in order to improve the following deficits and impairments:     Visit Diagnosis: Postural kyphosis of cervicothoracic region  Abnormal posture  Muscle weakness (generalized)  Abnormality of gait     Problem List Patient Active Problem List   Diagnosis Date Noted  . 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 Memory changes 12/04/2015  . 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 Word finding difficulty 09/14/2015  . Lack of coordination 09/14/2015  . Urinary frequency 09/14/2015  . 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 Hyperglycemia 09/14/2015  . Vegetarian diet 09/14/2015    Yolanda Robbins PTA 08/09/2016, 5:44 PM  [facility] [address] [place], [place], [address] Phone: [phone]   Fax:  [phone]  Name: Yolanda Robbins MRN: [id number] Date of Birth: 11-Dec-1940

## 2016-08-11 ENCOUNTER — Ambulatory Visit: Payer: Medicare HMO

## 2016-08-11 DIAGNOSIS — R293 Abnormal posture: Secondary | ICD-10-CM | POA: Diagnosis not present

## 2016-08-11 DIAGNOSIS — M6281 Muscle weakness (generalized): Secondary | ICD-10-CM

## 2016-08-11 DIAGNOSIS — R296 Repeated falls: Secondary | ICD-10-CM | POA: Diagnosis not present

## 2016-08-11 DIAGNOSIS — R2689 Other abnormalities of gait and mobility: Secondary | ICD-10-CM | POA: Diagnosis not present

## 2016-08-11 DIAGNOSIS — M4003 Postural kyphosis, cervicothoracic region: Secondary | ICD-10-CM | POA: Diagnosis not present

## 2016-08-11 DIAGNOSIS — R269 Unspecified abnormalities of gait and mobility: Secondary | ICD-10-CM | POA: Diagnosis not present

## 2016-08-11 DIAGNOSIS — R2681 Unsteadiness on feet: Secondary | ICD-10-CM | POA: Diagnosis not present

## 2016-08-11 NOTE — Therapy (Signed)
[place], [place], [address] Phone: [phone]   Fax:  [phone]  Physical Therapy Treatment  Patient Details  Name: Yolanda Robbins MRN: [id number] Date of Birth: 04/18/1940 Referring Provider: Hollace Kinnier  Encounter Date: 08/11/2016      PT End of Session - 08/11/16 1417    Visit Number 9   Number of Visits 13   Date for PT Re-Evaluation 09/23/16   Authorization Type [employer] MCR   PT Start Time 0125   PT Stop Time 0215   PT Time Calculation (min) 50 min   Activity Tolerance Patient tolerated treatment well;No increased pain   Behavior During Therapy [facility] for tasks assessed/performed      Past Medical History:  Diagnosis Date  . Anxiety   . Cancer [facility])    Breast/ bilateral mastectomy  . 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 Osteoporosis     Past Surgical History:  Procedure Laterality Date  . BREAST SURGERY    . FOOT SURGERY    . HYSTERECTOMY ABDOMINAL WITH SALPINGECTOMY  2010   Patient has a uterus on pelvic ultrasound 07/21/16 - 3 small fibroids. No ovaries identified.  [name] MASTECTOMY    . NASAL SEPTUM SURGERY    . TONSILLECTOMY AND ADENOIDECTOMY    . TUBAL LIGATION      There were no vitals filed for this visit.      Subjective Assessment - 08/11/16 1328    Subjective She asks about walking outside on treadmill. Questoions about orgaizations.  Appropriateness of loading items  into bookbag. She has some mild thoracolumbar back pain (1-2/10 intermittant today)                            [facility] Adult PT Treatment/Exercise - 08/11/16 0001      Therapeutic Activites    Other Therapeutic Activities Hip hinge lifting and sit to stand  She was able to do these well after cue to widenBOS      Reviewed all HEP  Along with above body mechanics with cues for posture .  Discussed further HEP with breathing emphasis and tai chi basic  Arthritis forms.              PT Education - 08/11/16 1552    Education provided Yes   Education Details discussed that basic exercises and limits of her tolerance of exercise due to being very active  more of the same review was not needed. Offered her som stretching with emphasis on breathing and trial of taichi movements to see if she is able to incoporate these into HEP or get into a class.    Person(s) Educated Patient   Methods Explanation   Comprehension Verbalized understanding          PT Short Term Goals - 08/09/16 1741      PT SHORT TERM GOAL #1   Title She will be independent with inital HEP   Baseline required cues   Time 3   Period Weeks   Status On-going           PT Long Term Goals - 08/11/16 1554      PT LONG TERM GOAL #1   Title She will be independent with all HEP issued    Status Partially Met     PT LONG TERM GOAL #2   Title She will report ability to hold head and trunk erect improved 50%  Status Achieved     PT LONG TERM GOAL #3   Title She will be able to incporporate breathing into stretching spine and demo intro movements of Tai chi for arthritis   Time 4   Period Weeks   Status New               Plan - 08/11/16 1415    Clinical Impression Statement We have exhausted the basic exerciss for osteoporosis Ms Fuerstenberg is able to cope with. I offered her some instructin in Tai Chi for OA and fall prevention as this is good for posture and strenght in standing,. ALso will work on breathing and see if she can remember any of this and apply to HEP   PT Frequency 1x / week   PT Duration 4 weeks  over 5-6 weeks due to vacations   PT Treatment/Interventions Cryotherapy;Moist Heat;Passive range of motion;Patient/family education;Manual techniques;Therapeutic exercise   PT Next Visit Plan Tai chi and breathing stretching inThoracic spine   PT Home Exercise Plan standing posture  correction,  decompression, arm opener,  Supine ER, Diagonals, horizontal  abduction   Consulted and Agree with Plan of Care Patient      Patient will benefit from skilled therapeutic intervention in order to improve the following deficits and impairments:  Postural dysfunction, Decreased strength, Decreased range of motion, Impaired flexibility  Visit Diagnosis: Postural kyphosis of cervicothoracic region  Abnormal posture  Muscle weakness (generalized)     Problem List Patient Active Problem List   Diagnosis Date Noted  . 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 Memory changes 12/04/2015  . 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 Word finding difficulty 09/14/2015  . Lack of coordination 09/14/2015  . Urinary frequency 09/14/2015  . 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 Hyperglycemia 09/14/2015  . Vegetarian diet 09/14/2015    ,  M 08/11/2016, 4:01 PM  [facility] [address] [place], [place], [address] Phone: [phone]   Fax:  [phone]  Name: Triana J Sherburn MRN: [id number] Date of Birth: 03/28/1940   

## 2016-08-15 ENCOUNTER — Ambulatory Visit: Payer: Medicare HMO

## 2016-08-15 DIAGNOSIS — M4003 Postural kyphosis, cervicothoracic region: Secondary | ICD-10-CM | POA: Diagnosis not present

## 2016-08-15 DIAGNOSIS — R2681 Unsteadiness on feet: Secondary | ICD-10-CM | POA: Diagnosis not present

## 2016-08-15 DIAGNOSIS — R2689 Other abnormalities of gait and mobility: Secondary | ICD-10-CM | POA: Diagnosis not present

## 2016-08-15 DIAGNOSIS — R296 Repeated falls: Secondary | ICD-10-CM | POA: Diagnosis not present

## 2016-08-15 DIAGNOSIS — R293 Abnormal posture: Secondary | ICD-10-CM | POA: Diagnosis not present

## 2016-08-15 DIAGNOSIS — R269 Unspecified abnormalities of gait and mobility: Secondary | ICD-10-CM | POA: Diagnosis not present

## 2016-08-15 DIAGNOSIS — M6281 Muscle weakness (generalized): Secondary | ICD-10-CM | POA: Diagnosis not present

## 2016-08-15 NOTE — Patient Instructions (Signed)
Issued standiing wall stretching with deep breathing n, standing correction of sway back and sitting ant pelvic tilt and posture correction 2x/day or more 2-10 reps

## 2016-08-15 NOTE — Therapy (Signed)
[place], [place], [address] Phone: [phone]   Fax:  [phone]  Physical Therapy Treatment  Patient Details  Name: Yolanda Robbins MRN: [id number] Date of Birth: [birth year] Referring Provider: Hollace Kinnier  Encounter Date: 08/15/2016      PT End of Session - 08/15/16 0705    Visit Number 10   Number of Visits 13   Date for PT Re-Evaluation 09/23/16   Authorization Type [employer] MCR   PT Start Time 0705   PT Stop Time 0750   PT Time Calculation (min) 45 min   Activity Tolerance Patient tolerated treatment well   Behavior During Therapy [facility] for tasks assessed/performed      Past Medical History:  Diagnosis Date  . Anxiety   . Cancer [facility])    Breast/ bilateral mastectomy  . 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 Osteoporosis     Past Surgical History:  Procedure Laterality Date  . BREAST SURGERY    . FOOT SURGERY    . HYSTERECTOMY ABDOMINAL WITH SALPINGECTOMY  2010   Patient has a uterus on pelvic ultrasound 07/21/16 - 3 small fibroids. No ovaries identified.  [name] MASTECTOMY    . NASAL SEPTUM SURGERY    . TONSILLECTOMY AND ADENOIDECTOMY    . TUBAL LIGATION      There were no vitals filed for this visit.          [facility] PT Assessment - 08/15/16 0001      Posture/Postural Control   Posture Comments scoliosis , kyphosis  swayback , RT shoulder higher than LT , forward head., trunk shift LT                      [facility] Adult PT Treatment/Exercise - 08/15/16 0001      Neuro Re-ed    Neuro Re-ed Details  Worked on posture with anterior pelvic tilt in sitting and cued to not involve shoulders .   Worked sitting leaned ove table with alternating reach and deep breathing into RT mid to lower back .  Worked in standing with arm overhead  positioning opposit normal posture and deep breathing                    PT Education - 08/15/16 0758    Education  provided Yes   Education Details Postiueal correection and pelvic control with breathing   Person(s) Educated Patient   Methods Explanation;Tactile cues;Verbal cues;Handout;Demonstration   Comprehension Returned demonstration;Verbalized understanding          PT Short Term Goals - 08/09/16 1741      PT SHORT TERM GOAL #1   Title She will be independent with inital HEP   Baseline required cues   Time 3   Period Weeks   Status On-going           PT Long Term Goals - 08/11/16 1554      PT LONG TERM GOAL #1   Title She will be independent with all HEP issued    Status Partially Met     PT LONG TERM GOAL #2   Title She will report ability to hold head and trunk erect improved 50%    Status Achieved     PT LONG TERM GOAL #3   Title She will be able to incporporate breathing into stretching spine and demo intro movements of Tai chi for arthritis   Time 4   Period Weeks  Status New               Plan - August 29, 2016 0755    Clinical Impression Statement She was able to demo exercise with cues for neuro control of posture in sitting and standing with pelvis.    Clinical Presentation due to: --   PT Treatment/Interventions Cryotherapy;Moist Heat;Passive range of motion;Patient/family education;Manual techniques;Therapeutic exercise   PT Next Visit Plan continue stretching /poisture and breathing   PT Home Exercise Plan standing posture  correction,  decompression, arm opener,  Supine ER, Diagonals, horizontal abduction   Consulted and Agree with Plan of Care Patient      Patient will benefit from skilled therapeutic intervention in order to improve the following deficits and impairments:  Postural dysfunction, Decreased strength, Decreased range of motion, Impaired flexibility  Visit Diagnosis: Postural kyphosis of cervicothoracic region  Abnormal posture  Muscle weakness (generalized)       G-Codes - 08-29-16 0800    Functional Assessment Tool Used  (Outpatient Only) clinical judgement   Functional Limitation Changing and maintaining body position   Changing and Maintaining Body Position Current Status (X8335) At least 40 percent but less than 60 percent impaired, limited or restricted   Changing and Maintaining Body Position Goal Status (O2518) At least 20 percent but less than 40 percent impaired, limited or restricted      Problem List Patient Active Problem List   Diagnosis Date Noted  . 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 Memory changes 12/04/2015  . 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 Word finding difficulty 09/14/2015  . Lack of coordination 09/14/2015  . Urinary frequency 09/14/2015  . 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 Hyperglycemia 09/14/2015  . Vegetarian diet 09/14/2015    Darrel Hoover  PT August 29, 2016, 8:00 AM  [facility] [address] [place], [place], [address] Phone: [phone]   Fax:  [phone]  Name: Yolanda Robbins MRN: [id number] Date of Birth: October 06, 1940

## 2016-08-23 ENCOUNTER — Ambulatory Visit: Payer: Medicare HMO

## 2016-08-23 DIAGNOSIS — R293 Abnormal posture: Secondary | ICD-10-CM

## 2016-08-23 DIAGNOSIS — R2681 Unsteadiness on feet: Secondary | ICD-10-CM | POA: Diagnosis not present

## 2016-08-23 DIAGNOSIS — M4003 Postural kyphosis, cervicothoracic region: Secondary | ICD-10-CM

## 2016-08-23 DIAGNOSIS — R2689 Other abnormalities of gait and mobility: Secondary | ICD-10-CM | POA: Diagnosis not present

## 2016-08-23 DIAGNOSIS — M6281 Muscle weakness (generalized): Secondary | ICD-10-CM

## 2016-08-23 DIAGNOSIS — R269 Unspecified abnormalities of gait and mobility: Secondary | ICD-10-CM | POA: Diagnosis not present

## 2016-08-23 DIAGNOSIS — R296 Repeated falls: Secondary | ICD-10-CM | POA: Diagnosis not present

## 2016-08-23 NOTE — Therapy (Signed)
[place], [place], [address] Phone: [phone]   Fax:  [phone]  Physical Therapy Treatment  Patient Details  Name: Yolanda Robbins MRN: [id number] Date of Birth: March 10, 1940 Referring Provider: Hollace Kinnier  Encounter Date: 08/23/2016      PT End of Session - 08/23/16 0805    Visit Number 11   Number of Visits 13   Date for PT Re-Evaluation 09/23/16   Authorization Type [employer] MCR   PT Start Time 0804   PT Stop Time 0844   PT Time Calculation (min) 40 min   Activity Tolerance Patient tolerated treatment well   Behavior During Therapy [facility] for tasks assessed/performed      Past Medical History:  Diagnosis Date  . Anxiety   . Cancer [facility])    Breast/ bilateral mastectomy  . 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 Osteoporosis     Past Surgical History:  Procedure Laterality Date  . BREAST SURGERY    . FOOT SURGERY    . HYSTERECTOMY ABDOMINAL WITH SALPINGECTOMY  2010   Patient has a uterus on pelvic ultrasound 07/21/16 - 3 small fibroids. No ovaries identified.  [name] MASTECTOMY    . NASAL SEPTUM SURGERY    . TONSILLECTOMY AND ADENOIDECTOMY    . TUBAL LIGATION      There were no vitals filed for this visit.      Subjective Assessment - 08/23/16 0806    Subjective mild LBP , maybe from sleeping .                          [facility] Adult PT Treatment/Exercise - 08/23/16 0001      Neuro Re-ed    Neuro Re-ed Details  Worked on posture with anterior pelvic tilt in sitting and standing  cued to not involve shoulders .   Worked standing leaned on pole with reaching and elongating RT side.  and deep breathing into RT mid to lower back .  Worked in standing with arm overhead at wall positioning opposite normal posture and deep breathing  . Worked on elongation RT side with deep breathing  With leg behind. Side glides LT shoulder on wall hip glide x 12 reps.       Neck  Exercises: Machines for Strengthening   UBE (Upper Arm Bike) 90 degrees per second, 5 min  Forward   1 min back                  PT Short Term Goals - 08/23/16 0849      PT SHORT TERM GOAL #1   Status Achieved           PT Long Term Goals - 08/23/16 0849      PT LONG TERM GOAL #1   Title She will be independent with all HEP issued    Status On-going     PT LONG TERM GOAL #2   Title She will report ability to hold head and trunk erect improved 50%    Status Achieved     PT LONG TERM GOAL #3   Title She will be able to incporporate breathing into stretching spine and demo intro movements of Tai chi for arthritis   Status On-going               Plan - 08/23/16 0846    Clinical Impression Statement She was more aware of body position moving hips over ankles  and  upper body alignment .     She was able to do the tweaks to her HEP today but will have to assess carry over next session.    PT Treatment/Interventions Cryotherapy;Moist Heat;Passive range of motion;Patient/family education;Manual techniques;Therapeutic exercise   PT Next Visit Plan continue stretching /poisture and breathing  2-3 visits then discharge   PT Home Exercise Plan standing posture  correction,  decompression, arm opener,  Supine ER, Diagonals, horizontal abduction, elongation with breathing and side glide against the wall LT side on wall.    Consulted and Agree with Plan of Care Patient      Patient will benefit from skilled therapeutic intervention in order to improve the following deficits and impairments:  Postural dysfunction, Decreased strength, Decreased range of motion, Impaired flexibility  Visit Diagnosis: Postural kyphosis of cervicothoracic region  Abnormal posture  Muscle weakness (generalized)     Problem List Patient Active Problem List   Diagnosis Date Noted  . 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 Mixed obsessional  thoughts and acts 07/05/2016  . Memory changes 12/04/2015  . 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 Word finding difficulty 09/14/2015  . Lack of coordination 09/14/2015  . Urinary frequency 09/14/2015  . 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 Hyperglycemia 09/14/2015  . Vegetarian diet 09/14/2015    Darrel Hoover  PT 08/23/2016, 9:28 AM  [facility] [address] [place], [place], [address] Phone: [phone]   Fax:  [phone]  Name: LORELEI HEIKKILA MRN: [id number] Date of Birth: May 28, 1940

## 2016-08-25 ENCOUNTER — Encounter: Payer: Self-pay | Admitting: Obstetrics and Gynecology

## 2016-08-25 ENCOUNTER — Ambulatory Visit (INDEPENDENT_AMBULATORY_CARE_PROVIDER_SITE_OTHER): Payer: Medicare HMO | Admitting: Obstetrics and Gynecology

## 2016-08-25 VITALS — BP 118/70 | HR 84 | Resp 16 | Wt 132.0 lb

## 2016-08-25 DIAGNOSIS — N816 Rectocele: Secondary | ICD-10-CM | POA: Diagnosis not present

## 2016-08-25 NOTE — Progress Notes (Signed)
GYNECOLOGY  VISIT   HPI: 76 y.o.   Widowed  Caucasian  female   G1P1001 with No LMP recorded. Patient is postmenopausal.   here for 1 month recheck/pessary  Removing the pessary weekly to clean it.  No pain or bleeding.  No vaginal discharge. No difficulty voiding.  Did leak a couple of times. Bowel movements are ok.  Using a water based lubricant.  Changing her pad more frequently.  Urinary frequency is not consistent.   Happy with the pessary overall.   GYNECOLOGIC HISTORY: No LMP recorded. Patient is postmenopausal. Contraception:  Postmenopausal. Menopausal hormone therapy:  none Last mammogram:  Bilateral Mastectomy done in 1966 per patient Last pap smear:   06/09/2011 Pap smear negative.  No abnormal pap smear.         OB History    Gravida Para Term Preterm AB Living   1 1 1  0 0 1   SAB TAB Ectopic Multiple Live Births   0 0 0 0 0         Patient Active Problem List   Diagnosis Date Noted  . 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 Memory changes 12/04/2015  . 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 Word finding difficulty 09/14/2015  . Lack of coordination 09/14/2015  . Urinary frequency 09/14/2015  . 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 Hyperglycemia 09/14/2015  . Vegetarian diet 09/14/2015    Past Medical History:  Diagnosis Date  . Anxiety   . Cancer [facility])    Breast/ bilateral mastectomy  . 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 Osteoporosis     Past Surgical History:  Procedure Laterality Date  . BREAST SURGERY    . FOOT SURGERY    . MASTECTOMY    . NASAL SEPTUM SURGERY    . TONSILLECTOMY AND ADENOIDECTOMY    . TUBAL LIGATION      Current Outpatient Prescriptions  Medication Sig Dispense Refill  . ADK 5000-5000-500 UNIT-MCG CAPS Take by mouth daily.    [name] b complex vitamins tablet Take 1 tablet by mouth daily.    [name] BIOTIN PO Take  2,000 mcg by mouth daily.    . Calcium Carb-Cholecalciferol (CALCIUM 1000 + D PO) Take by mouth.    . Coenzyme Q10 (COQ10 PO) Take by mouth.    . Lactobacillus (ACIDOPHILUS PO) Take by mouth daily.    . Multiple Vitamins-Minerals (OSTEO COMPLEX PO) Take 2 tablets by mouth 2 (two) times daily.    . Nutritional Supplements (JUICE PLUS FIBRE PO) Take 1 tablet by mouth daily.    . Selenium 200 MCG CAPS Take by mouth daily.    . TURMERIC PO Take 1 tablet by mouth daily.     Current Facility-Administered Medications  Medication Dose Route Frequency Provider Last Rate Last Dose  . 0.9 %  sodium chloride infusion  500 mL Intravenous Continuous Irene Shipper, MD         ALLERGIES: Patient has no known allergies.  Family History  Problem Relation Age of Onset  . Adopted: Yes  . Osteoporosis Mother   . Stroke Mother   . Colon cancer Neg Hx     Social History   Social History  . Marital status: Widowed    Spouse name: N/A  . Number of children: N/A  . Years of education: N/A   Occupational History  . Not on file.   Social History Main Topics  .  Smoking status: Never Smoker  . Smokeless tobacco: Never Used  . Alcohol use No  . Drug use: No  . Sexual activity: No     Comment: Tubal ligation   Other Topics Concern  . Not on file   Social History Narrative  . No narrative on file    ROS:  Pertinent items are noted in HPI.  PHYSICAL EXAMINATION:    BP 118/70 (BP Location: Right Arm, Patient Position: Sitting, Cuff Size: Normal)   Pulse 84   Resp 16   Wt 132 lb (59.9 kg)   BMI 20.22 kg/m     General appearance: alert, cooperative and appears stated age   Pelvic: External genitalia:  no lesions              Urethra:  normal appearing urethra with no masses, tenderness or lesions              Bartholins and Skenes: normal                 Vagina: normal appearing vagina with normal color and discharge, no lesions              Cervix: no lesions                Bimanual  Exam:  Uterus:  normal size, contour, position, consistency, mobility, non-tender              Adnexa: no mass, fullness, tenderness    Pessary removed, cleansed, and replaced.          Chaperone was present for exam.  ASSESSMENT  Status post bilateral oophorectomy.  Still has her uterus.  Rectocele.  Urinary frequency.   PLAN  Continue pessary care and use of water based lubricant.  We briefly discussed medication for overactive bladder.  As her symptoms are not consistent, will not prescribe this.  Follow up in 3 months for well woman visit.    An After Visit Summary was printed and given to the patient.  __15____ minutes face to face time of which over 50% was spent in counseling.

## 2016-08-25 NOTE — Patient Instructions (Signed)

## 2016-09-06 ENCOUNTER — Ambulatory Visit: Payer: Medicare HMO | Attending: Internal Medicine | Admitting: Rehabilitative and Restorative Service Providers"

## 2016-09-06 DIAGNOSIS — M4003 Postural kyphosis, cervicothoracic region: Secondary | ICD-10-CM | POA: Insufficient documentation

## 2016-09-06 DIAGNOSIS — R293 Abnormal posture: Secondary | ICD-10-CM | POA: Insufficient documentation

## 2016-09-06 DIAGNOSIS — M6281 Muscle weakness (generalized): Secondary | ICD-10-CM | POA: Diagnosis not present

## 2016-09-06 DIAGNOSIS — R2681 Unsteadiness on feet: Secondary | ICD-10-CM | POA: Insufficient documentation

## 2016-09-06 DIAGNOSIS — R269 Unspecified abnormalities of gait and mobility: Secondary | ICD-10-CM | POA: Diagnosis not present

## 2016-09-06 DIAGNOSIS — R2689 Other abnormalities of gait and mobility: Secondary | ICD-10-CM | POA: Diagnosis not present

## 2016-09-06 NOTE — Therapy (Signed)
[place], [place], [address] Phone: [phone]   Fax:  [phone]  Physical Therapy Treatment  Patient Details  Name: Yolanda Robbins MRN: [id number] Date of Birth: 10-Aug-1940 Referring Provider: Hollace Kinnier  Encounter Date: 09/06/2016      PT End of Session - 09/06/16 0927    Visit Number 12   Number of Visits 13   Authorization Type [employer] MCR   PT Start Time 3154   PT Stop Time 0940   PT Time Calculation (min) 45 min   Activity Tolerance Patient tolerated treatment well;No increased pain   Behavior During Therapy [facility] for tasks assessed/performed      Past Medical History:  Diagnosis Date  . Anxiety   . Cancer [facility])    Breast/ bilateral mastectomy  . 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 Osteoporosis     Past Surgical History:  Procedure Laterality Date  . BREAST SURGERY    . FOOT SURGERY    . MASTECTOMY    . NASAL SEPTUM SURGERY    . TONSILLECTOMY AND ADENOIDECTOMY    . TUBAL LIGATION      There were no vitals filed for this visit.      Subjective Assessment - 09/06/16 0901    Subjective no pain except when I get up in the morning sometimes. pt arrived late to appt.   Currently in Pain? No/denies                         [facility] Adult PT Treatment/Exercise - 09/06/16 0001      Lumbar Exercises: Seated   Other Seated Lumbar Exercises UBE posterior only with tilt with PT verbal cues for scap retract x 5 min level 2; pt unable to perform higher resistance due to cervical compensation     Lumbar Exercises: Supine   Other Supine Lumbar Exercises chin tuck with shoulder retract x 20; chin tuck with alt scap depression x 20;      Shoulder Exercises: Supine   Other Supine Exercises chin tuck with bil shoulder ext isometric x 20; chin tuck with tilt with alt scap depression x 20 each direction; chin tuck with tilt with horiz abdct/addct x 20                 PT Education - 09/06/16 0937    Education provided Yes   Education Details Education provided on not performing chiropractor adjustments due to pt having osteoporosis and proper postural alignment at all times   Person(s) Educated Patient   Methods Explanation;Demonstration;Verbal cues   Comprehension Verbalized understanding;Returned demonstration          PT Short Term Goals - 08/23/16 0849      PT SHORT TERM GOAL #1   Status Achieved           PT Long Term Goals - 09/06/16 0086      PT LONG TERM GOAL #1   Title She will be independent with all HEP issued    Baseline MET 11/19/2014   Time 6   Period Weeks   Status On-going     PT LONG TERM GOAL #2   Title She will report ability to hold head and trunk erect improved 50%      PT LONG TERM GOAL #3   Title She will be able to incporporate breathing into stretching spine and demo intro movements of Tai chi for arthritis   Time 4  Period Weeks   Status On-going               Plan - 09/06/16 6742    Clinical Impression Statement Pt is unaware of shoulder hike and cervical compensation with all movements. Pt would benefit from continued spinal decompression/elongation exercises to improve postural awareness.    Rehab Potential Good   PT Frequency 1x / week   PT Duration 4 weeks   PT Treatment/Interventions Cryotherapy;Moist Heat;Passive range of motion;Patient/family education;Manual techniques;Therapeutic exercise   PT Next Visit Plan continue stretching /poisture and breathing  1-2 visits then discharge   PT Home Exercise Plan standing posture  correction,  decompression, arm opener,  Supine ER, Diagonals, horizontal abduction, elongation with breathing and side glide against the wall LT side on wall.    Consulted and Agree with Plan of Care Patient      Patient will benefit from skilled therapeutic intervention in order to improve the following deficits and impairments:  Postural  dysfunction, Decreased strength, Decreased range of motion, Impaired flexibility  Visit Diagnosis: Postural kyphosis of cervicothoracic region  Abnormal posture  Muscle weakness (generalized)  Abnormality of gait  Unsteadiness  Balance problems     Problem List Patient Active Problem List   Diagnosis Date Noted  . 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 Memory changes 12/04/2015  . 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 Word finding difficulty 09/14/2015  . Lack of coordination 09/14/2015  . Urinary frequency 09/14/2015  . 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 Hyperglycemia 09/14/2015  . Vegetarian diet 09/14/2015    Myra Rude, PT 09/06/2016, 11:13 AM  [place], [place], [address] Phone: [phone]   Fax:  [phone]  Name: MADGE THERRIEN MRN: [id number] Date of Birth: 01-Oct-1940

## 2016-09-13 ENCOUNTER — Ambulatory Visit: Payer: Medicare HMO

## 2016-09-13 DIAGNOSIS — R2681 Unsteadiness on feet: Secondary | ICD-10-CM | POA: Diagnosis not present

## 2016-09-13 DIAGNOSIS — M6281 Muscle weakness (generalized): Secondary | ICD-10-CM | POA: Diagnosis not present

## 2016-09-13 DIAGNOSIS — R269 Unspecified abnormalities of gait and mobility: Secondary | ICD-10-CM | POA: Diagnosis not present

## 2016-09-13 DIAGNOSIS — R293 Abnormal posture: Secondary | ICD-10-CM

## 2016-09-13 DIAGNOSIS — M4003 Postural kyphosis, cervicothoracic region: Secondary | ICD-10-CM

## 2016-09-13 DIAGNOSIS — R2689 Other abnormalities of gait and mobility: Secondary | ICD-10-CM | POA: Diagnosis not present

## 2016-09-13 NOTE — Therapy (Signed)
[place], [place], [address] Phone: [phone]   Fax:  [phone]  Physical Therapy Treatment/Discharge  Patient Details  Name: LANNA LABELLA MRN: [id number] Date of Birth: 13-Jul-1940 Referring Provider: Hollace Kinnier  Encounter Date: 09/13/2016      PT End of Session - 09/13/16 1000    Visit Number 13   Number of Visits 13   Date for PT Re-Evaluation 09/23/16   Authorization Type [employer] MCR   PT Start Time 0900   PT Stop Time 0940   PT Time Calculation (min) 40 min   Activity Tolerance Patient tolerated treatment well   Behavior During Therapy [facility] for tasks assessed/performed      Past Medical History:  Diagnosis Date  . Anxiety   . Cancer [facility])    Breast/ bilateral mastectomy  . 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 Osteoporosis     Past Surgical History:  Procedure Laterality Date  . BREAST SURGERY    . FOOT SURGERY    . MASTECTOMY    . NASAL SEPTUM SURGERY    . TONSILLECTOMY AND ADENOIDECTOMY    . TUBAL LIGATION      There were no vitals filed for this visit.      Subjective Assessment - 09/13/16 0907    Subjective Feeling fine . ready for discharge   Currently in Pain? No/denies            [facility] PT Assessment - 09/13/16 0001      AROM   Cervical Flexion 65   Cervical Extension 92   Cervical - Right Side Bend 42   Cervical - Left Side Bend 42   Cervical - Right Rotation 80   Cervical - Left Rotation 60                     [facility] Adult PT Treatment/Exercise - 09/13/16 0001      Neck Exercises: Machines for Strengthening   UBE (Upper Arm Bike) 90 degrees per second, 5 min  forward     Reviewed HEP and she still need some minor cues for initiating some of the exercise. She always seems somewhat unsure and these exer have been reviewed multiple times over last 6 weeks . She has a good HEP and works out at [employer] and with classes for  Pilates. She was late today so treatment was limited           PT Education - 09/13/16 0959    Education provided Yes   Education Details Review of HEP . Educated to not do flexion of spine exercises    Person(s) Educated Patient   Methods Explanation;Verbal cues   Comprehension Verbalized understanding;Returned demonstration          PT Short Term Goals - 08/23/16 0849      PT SHORT TERM GOAL #1   Status Achieved           PT Long Term Goals - 09/13/16 1003      PT LONG TERM GOAL #1   Title She will be independent with all HEP issued    Status Partially Met     PT LONG TERM GOAL #2   Title She will report ability to hold head and trunk erect improved 50%    Status Achieved     PT LONG TERM GOAL #3   Title She will be able to incporporate breathing into stretching spine and demo intro movements of  Tai chi for arthritis   Status Achieved               Plan - 09-15-16 1000    Clinical Impression Statement Ms Keeran is ready for discharge She has an extensive HEP along with doing Pilates and machinges at gym.  she is having no pain. I suggested she may have psotural issues from spinal degenerative changes  and compesatory postures due to this. Loss of abdominal muscle tissue does not help this.   She is doing well with exer and functional activity. She does seem somewhat forgetful as after  6 weeks she still needs some minor cues to initiate some exer       PT Treatment/Interventions Cryotherapy;Moist Heat;Passive range of motion;Patient/family education;Manual techniques;Therapeutic exercise   PT Next Visit Plan discharge with HEP  today   PT Home Exercise Plan standing posture  correction,  decompression, arm opener,  Supine ER, Diagonals, horizontal abduction, elongation with breathing and side glide against the wall LT side on wall.    Consulted and Agree with Plan of Care Patient      Patient will benefit from skilled therapeutic intervention in order  to improve the following deficits and impairments:  Postural dysfunction, Decreased strength, Decreased range of motion, Impaired flexibility  Visit Diagnosis: Postural kyphosis of cervicothoracic region  Abnormal posture  Muscle weakness (generalized)       G-Codes - Sep 15, 2016 1004    Functional Assessment Tool Used (Outpatient Only) clinical judgement   Functional Limitation Changing and maintaining body position   Changing and Maintaining Body Position Current Status (Q7591) At least 20 percent but less than 40 percent impaired, limited or restricted   Changing and Maintaining Body Position Goal Status (M3846) At least 20 percent but less than 40 percent impaired, limited or restricted   Changing and Maintaining Body Position Discharge Status (K5993) At least 20 percent but less than 40 percent impaired, limited or restricted      Problem List Patient Active Problem List   Diagnosis Date Noted  . 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 Memory changes 12/04/2015  . 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 Word finding difficulty Sep 16, 2015  . Lack of coordination 09-16-2015  . Urinary frequency 09-16-2015  . Senile osteoporosis September 16, 2015  . Hyperglycemia September 16, 2015  . Vegetarian diet 09-16-15    Darrel Hoover  PT 09/15/16, 10:05 AM  [facility] [address] [place] , [place], [address] Phone: [phone]   Fax:  [phone]  Name: WITNEY HUIE MRN: [id number] Date of Birth: August 11, 1940  PHYSICAL THERAPY DISCHARGE SUMMARY  Visits from Start of Care: 13  Current functional level related to goals / functional outcomes: See above    Remaining deficits: See above   Education / Equipment: HEP Plan: Patient agrees to discharge.  Patient goals were met. Patient is being discharged due to                                                      ?????   MAX benefit from PT

## 2016-09-19 ENCOUNTER — Encounter: Payer: Self-pay | Admitting: Internal Medicine

## 2016-09-19 ENCOUNTER — Telehealth: Payer: Self-pay | Admitting: Obstetrics and Gynecology

## 2016-09-19 ENCOUNTER — Ambulatory Visit (INDEPENDENT_AMBULATORY_CARE_PROVIDER_SITE_OTHER): Payer: Medicare HMO | Admitting: Internal Medicine

## 2016-09-19 VITALS — BP 122/60 | HR 82 | Temp 98.0°F | Wt 132.0 lb

## 2016-09-19 DIAGNOSIS — E890 Postprocedural hypothyroidism: Secondary | ICD-10-CM

## 2016-09-19 DIAGNOSIS — M810 Age-related osteoporosis without current pathological fracture: Secondary | ICD-10-CM

## 2016-09-19 DIAGNOSIS — F422 Mixed obsessional thoughts and acts: Secondary | ICD-10-CM | POA: Diagnosis not present

## 2016-09-19 DIAGNOSIS — N816 Rectocele: Secondary | ICD-10-CM | POA: Insufficient documentation

## 2016-09-19 LAB — CBC WITH DIFFERENTIAL/PLATELET
Basophils Absolute: 58 cells/uL (ref 0–200)
Basophils Relative: 1 %
Eosinophils Absolute: 174 cells/uL (ref 15–500)
Eosinophils Relative: 3 %
HCT: 38.2 % (ref 35.0–45.0)
Hemoglobin: 12.8 g/dL (ref 11.7–15.5)
Lymphocytes Relative: 28 %
Lymphs Abs: 1624 cells/uL (ref 850–3900)
MCH: 31.3 pg (ref 27.0–33.0)
MCHC: 33.5 g/dL (ref 32.0–36.0)
MCV: 93.4 fL (ref 80.0–100.0)
MPV: 10.9 fL (ref 7.5–12.5)
Monocytes Absolute: 580 cells/uL (ref 200–950)
Monocytes Relative: 10 %
Neutro Abs: 3364 cells/uL (ref 1500–7800)
Neutrophils Relative %: 58 %
Platelets: 173 10*3/uL (ref 140–400)
RBC: 4.09 MIL/uL (ref 3.80–5.10)
RDW: 13.9 % (ref 11.0–15.0)
WBC: 5.8 10*3/uL (ref 3.8–10.8)

## 2016-09-19 LAB — TSH: TSH: 2.28 mIU/L

## 2016-09-19 NOTE — Progress Notes (Signed)
Location:  [facility] clinic Provider:  [name], D.O., C.M.D.  Code Status: full code Goals of Care:  Advanced Directives 07/07/2016  Does Patient Have a Medical Advance Directive? No  Type of Advance Directive -  Copy of [place] in Chart? -  Would patient like information on creating a medical advance directive? Yes (MAU/Ambulatory/Procedural Areas - Information given)   Chief Complaint  Patient presents with  . Medical Management of Chronic Issues    3MTH FOLLOW-UP    HPI: Patient is a 76 y.o. female seen today for medical management of chronic diseases.    Using pessary for rectocele.  This is helping.  No longer needs pap smear at age 31.  Had oophorectomy.  Has uterus, but no problems with it with normal paps in past and no family history.    No med changes with integrative medicine.    Had last PT session last week.  Posture has improved.  Trying to maintain that.    Is for bone density later this month.  On ca with D and additional D.  Not on medications.    Past Medical History:  Diagnosis Date  . Anxiety   . Cancer [facility])    Breast/ bilateral mastectomy  . 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 Osteoporosis     Past Surgical History:  Procedure Laterality Date  . BREAST SURGERY    . FOOT SURGERY    . MASTECTOMY    . NASAL SEPTUM SURGERY    . TONSILLECTOMY AND ADENOIDECTOMY    . TUBAL LIGATION      No Known Allergies  Allergies as of 09/19/2016   No Known Allergies     Medication List       Accurate as of 09/19/16  3:23 PM. Always use your most recent med list.          ACIDOPHILUS PO Take by mouth daily.   ADK 5000-5000-500 UNIT-MCG Caps Take by mouth daily.   b complex vitamins tablet Take 1 tablet by mouth daily.   CALCIUM 1000 + D PO Take by mouth.   COQ10 PO Take by mouth.   JUICE PLUS FIBRE PO Take 1 tablet by mouth daily.   OSTEO COMPLEX PO Take 2 tablets by mouth 2 (two) times daily.   Selenium 200 MCG Caps Take by mouth daily.   TURMERIC PO Take 1 tablet by mouth daily.       Review of Systems:  Review of Systems  Constitutional: Negative for chills, fever and malaise/fatigue.  HENT: Negative for congestion.   Eyes: Negative for blurred vision.  Respiratory: Negative for cough and shortness of breath.   Cardiovascular: Negative for chest pain and palpitations.  Gastrointestinal: Negative for abdominal pain.  Genitourinary: Negative for dysuria, frequency and urgency.  Musculoskeletal: Negative for falls and myalgias.  Skin: Negative for itching and rash.  Neurological: Negative for dizziness, loss of consciousness and weakness.  Psychiatric/Behavioral: Negative for depression and memory loss. The patient is nervous/anxious.     Health Maintenance  Topic Date Due  . TETANUS/TDAP  01/31/2022  . DEXA SCAN  Completed  . PNA vac Low Risk Adult  Completed    Physical Exam: Vitals:   09/19/16 1509  BP: 122/60  Pulse: 82  Temp: 98 F (36.7 C)  TempSrc: Oral  SpO2: 98%  Weight: 132 lb (59.9 kg)   Body mass index is 20.22 kg/m. Physical Exam  Constitutional: She is oriented to person, place, and  time. She appears well-developed. No distress.  Cardiovascular: Normal rate, regular rhythm, normal heart sounds and intact distal pulses.   Pulmonary/Chest: Effort normal and breath sounds normal. No respiratory distress.  Abdominal: Soft. Bowel sounds are normal.  Musculoskeletal: Normal range of motion.  Neurological: She is alert and oriented to person, place, and time.  Skin: Skin is warm and dry. Capillary refill takes less than 2 seconds.  Psychiatric: She has a normal mood and affect.    Labs reviewed: Basic Metabolic Panel: No results for input(s): NA, K, CL, CO2, GLUCOSE, BUN, CREATININE, CALCIUM, MG, PHOS, TSH in the last 8760 hours. Liver Function Tests: No results for input(s): AST, ALT, ALKPHOS, BILITOT, PROT, ALBUMIN in the last 8760  hours. No results for input(s): LIPASE, AMYLASE in the last 8760 hours. No results for input(s): AMMONIA in the last 8760 hours. CBC: No results for input(s): WBC, NEUTROABS, HGB, HCT, MCV, PLT in the last 8760 hours. Lipid Panel: No results for input(s): CHOL, HDL, LDLCALC, TRIG, CHOLHDL, LDLDIRECT in the last 8760 hours. Lab Results  Component Value Date   HGBA1C 5.4 09/14/2015    Procedures since last visit: No results found.  Assessment/Plan 1. Postablative hypothyroidism - f/u labs (pt reports done at integrative clinic and not normal so we'll see what the usual internal medicine labs reveal - TSH - CBC with Differential/Platelet - COMPLETE METABOLIC PANEL WITH GFR  2. Rectocele -now using pessary with benefit due to this and vaginal wall prolapse  3. Senile osteoporosis -ongoing with f/u bone density ordered and pending, needs medication for this, does exercise and has followed a vegetarian diet so likely inadequate calcium much of her life, small stature and high risk for fractures -cont ca with D and additional D  4. Mixed obsessional thoughts and acts -off SSRI again -anxious and jittery today  Labs/tests ordered:   Orders Placed This Encounter  Procedures  . TSH  . CBC with Differential/Platelet  . COMPLETE METABOLIC PANEL WITH GFR    Next appt:  6 mos med mgt  [name], D.O. [facility] [address], [place] [address] Cell Phone (Mon-Fri 8am-5pm):  [phone] On Call:  [phone] & follow prompts after 5pm & weekends Office Phone:  [phone] Office Fax:  [phone]

## 2016-09-19 NOTE — Telephone Encounter (Signed)
Dr. Quincy Simmonds -please review patient message below and advise?  Last pap 06/09/2011, negative. Per review of OV notes dated 08/25/16, patient to f/u in 3 months for well woman exam.

## 2016-09-19 NOTE — Telephone Encounter (Signed)
Patient was going to have her pap done at her PCP due to insurance coverage. Patient's PCP told her she normally does not due paps after the age of 66. Patient said her PCP would do the pap if recommended by her Dr.Silva. Patient is asking if she needs a pap?

## 2016-09-19 NOTE — Telephone Encounter (Signed)
Paps may be discontinued after age 76 yo, but our office is still recommending them due to continued risk of cervical cancer after this age.  The patient recently discovered that she did not have a hysterectomy in the past.  Her last pap was 5 years ago, and I would recommend performing it.  She may do this with her PCP if she desires.  I am happy to see her in follow up as needed.

## 2016-09-20 ENCOUNTER — Encounter: Payer: Self-pay | Admitting: *Deleted

## 2016-09-20 LAB — COMPLETE METABOLIC PANEL WITH GFR
ALT: 18 U/L (ref 6–29)
AST: 26 U/L (ref 10–35)
Albumin: 3.9 g/dL (ref 3.6–5.1)
Alkaline Phosphatase: 77 U/L (ref 33–130)
BUN: 15 mg/dL (ref 7–25)
CO2: 29 mmol/L (ref 20–32)
Calcium: 9.3 mg/dL (ref 8.6–10.4)
Chloride: 103 mmol/L (ref 98–110)
Creat: 0.66 mg/dL (ref 0.60–0.93)
GFR, Est African American: >89 mL/min (ref >=60)
GFR, Est Non African American: 86 mL/min (ref >=60)
Glucose, Bld: 103 mg/dL — ABNORMAL HIGH (ref 65–99)
Potassium: 4.2 mmol/L (ref 3.5–5.3)
Sodium: 142 mmol/L (ref 135–146)
Total Bilirubin: 0.4 mg/dL (ref 0.2–1.2)
Total Protein: 6.4 g/dL (ref 6.1–8.1)

## 2016-09-20 NOTE — Telephone Encounter (Signed)
Spoke with patient, advised as seen below per Dr. Quincy Simmonds. Patient states she will f/u with PCP for pap and have results sent to Dr. Quincy Simmonds. Patient verbalizes understanding and is agreeable.  Patient is agreeable to disposition. Will close encounter.

## 2016-09-21 ENCOUNTER — Telehealth: Payer: Self-pay

## 2016-09-21 NOTE — Telephone Encounter (Signed)
Patient called to let Dr. Mariea Clonts know that she would like to have the pap test that was discussed at last OV. Pt stated that her GYN would not do the pap because her insurance would not cover having it done there. Pt was told that she needed the pap (per gyn) because she still has her cervix and she was still at risk for cervical cancer. Pt has not had a pap in 5 years.  Please advise

## 2016-09-21 NOTE — Telephone Encounter (Signed)
Patient scheduled for 09/22/16 for 1pm. There was no other slots open until end of October and pt did not want to wait that long.

## 2016-09-21 NOTE — Telephone Encounter (Signed)
Ok, let's schedule her for the pap smear with me in next regular slot available (not a same day).

## 2016-09-22 ENCOUNTER — Ambulatory Visit: Payer: Self-pay | Admitting: Internal Medicine

## 2016-09-23 ENCOUNTER — Ambulatory Visit
Admission: RE | Admit: 2016-09-23 | Discharge: 2016-09-23 | Disposition: A | Discharge status: Home / Self Care | Payer: Medicare HMO | Source: Ambulatory Visit | Attending: Internal Medicine | Admitting: Internal Medicine

## 2016-09-23 DIAGNOSIS — Z780 Asymptomatic menopausal state: Secondary | ICD-10-CM | POA: Diagnosis not present

## 2016-09-23 DIAGNOSIS — M810 Age-related osteoporosis without current pathological fracture: Secondary | ICD-10-CM | POA: Diagnosis not present

## 2016-09-26 ENCOUNTER — Encounter: Payer: Self-pay | Admitting: *Deleted

## 2016-10-21 DIAGNOSIS — H11423 Conjunctival edema, bilateral: Secondary | ICD-10-CM | POA: Diagnosis not present

## 2016-10-21 DIAGNOSIS — H43812 Vitreous degeneration, left eye: Secondary | ICD-10-CM | POA: Diagnosis not present

## 2016-10-21 DIAGNOSIS — Z9849 Cataract extraction status, unspecified eye: Secondary | ICD-10-CM | POA: Diagnosis not present

## 2016-10-21 DIAGNOSIS — Z961 Presence of intraocular lens: Secondary | ICD-10-CM | POA: Diagnosis not present

## 2016-10-21 DIAGNOSIS — H11153 Pinguecula, bilateral: Secondary | ICD-10-CM | POA: Diagnosis not present

## 2016-10-21 DIAGNOSIS — H04123 Dry eye syndrome of bilateral lacrimal glands: Secondary | ICD-10-CM | POA: Diagnosis not present

## 2016-10-21 DIAGNOSIS — H353121 Nonexudative age-related macular degeneration, left eye, early dry stage: Secondary | ICD-10-CM | POA: Diagnosis not present

## 2016-10-21 DIAGNOSIS — H18413 Arcus senilis, bilateral: Secondary | ICD-10-CM | POA: Diagnosis not present

## 2016-11-07 DIAGNOSIS — H16223 Keratoconjunctivitis sicca, not specified as Sjogren's, bilateral: Secondary | ICD-10-CM | POA: Diagnosis not present

## 2016-11-07 DIAGNOSIS — H353121 Nonexudative age-related macular degeneration, left eye, early dry stage: Secondary | ICD-10-CM | POA: Diagnosis not present

## 2016-11-07 DIAGNOSIS — H04123 Dry eye syndrome of bilateral lacrimal glands: Secondary | ICD-10-CM | POA: Diagnosis not present

## 2016-11-07 DIAGNOSIS — H43812 Vitreous degeneration, left eye: Secondary | ICD-10-CM | POA: Diagnosis not present

## 2016-11-15 DIAGNOSIS — E039 Hypothyroidism, unspecified: Secondary | ICD-10-CM | POA: Diagnosis not present

## 2016-11-15 DIAGNOSIS — Z79899 Other long term (current) drug therapy: Secondary | ICD-10-CM | POA: Diagnosis not present

## 2016-11-23 NOTE — Progress Notes (Signed)
GYNECOLOGY  VISIT   HPI: 76 y.o.   Widowed  Caucasian  female   G1P1001 with Patient's last menstrual period was 01/31/2006 (approximate).   here for 3 month pessary follow up.   Has a little bit of blood, light amount with placing or removing the pessary.  Removing the pessary weekly.  No spontaneous vaginal bleeding.  Occasionally can feel the pessary when she is sitting.   Urinary control is good if she voids when she needs to.  If she waits too long, she may have some urgency.  She does wear a pad.  She does get up 2 - 3 times per night to void.  She will [facility] class at [facility] about [facility] control.  For the first time she leaked with a sneeze yesterday.   BMs are normal.   She does not drink coffee or tea.  Carbonated beverage rarely.   Patient is taking Prometrium for osteoporosis. [facility] - Dr. Sharlett Iles in [place] - is prescribing Prometrium.  GYNECOLOGIC HISTORY: Patient's last menstrual period was 01/31/2006 (approximate). Contraception:  Postmenopausal Menopausal hormone therapy:  none Last mammogram:  Bilateral Mastectomy done in 1966 per patient Last pap smear:  06/09/2011 Pap smear negative.  No abnormal pap smear.         OB History    Gravida Para Term Preterm AB Living   1 1 1  0 0 1   SAB TAB Ectopic Multiple Live Births   0 0 0 0 0         Patient Active Problem List   Diagnosis Date Noted  . Rectocele 09/19/2016  . 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 Memory changes 12/04/2015  . 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 Word finding difficulty 09/14/2015  . Lack of coordination 09/14/2015  . Urinary frequency 09/14/2015  . 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 Hyperglycemia 09/14/2015  . Vegetarian diet 09/14/2015    Past Medical History:  Diagnosis Date  . Anxiety   . Cancer [facility])    Breast/ bilateral mastectomy  . Obsessive  compulsive personality disorder ([place]) 01/11/2016  . Osteoporosis     Past Surgical History:  Procedure Laterality Date  . BREAST SURGERY    . FOOT SURGERY    . MASTECTOMY    . NASAL SEPTUM SURGERY    . OOPHORECTOMY     Bil.oophorectomy  . TONSILLECTOMY AND ADENOIDECTOMY    . TUBAL LIGATION      Current Outpatient Prescriptions  Medication Sig Dispense Refill  . ADK 5000-5000-500 UNIT-MCG CAPS Take by mouth daily.    [name] b complex vitamins tablet Take 1 tablet by mouth daily.    . Calcium Carb-Cholecalciferol (CALCIUM 1000 + D PO) Take by mouth.    . Coenzyme Q10 (COQ10 PO) Take by mouth.    . Lactobacillus (ACIDOPHILUS PO) Take by mouth daily.    . Multiple Vitamins-Minerals (OSTEO COMPLEX PO) Take 2 tablets by mouth 2 (two) times daily.    . Nutritional Supplements (JUICE PLUS FIBRE PO) Take 1 tablet by mouth daily.    . progesterone (PROMETRIUM) 200 MG capsule Take 200 mg by mouth daily. Takes for osteoporosis    . Selenium 200 MCG CAPS Take by mouth daily.    . TURMERIC PO Take 1 tablet by mouth daily.     Current Facility-Administered Medications  Medication Dose Route Frequency Provider Last Rate Last Dose  . 0.9 %  sodium chloride infusion  500 mL Intravenous Continuous Irene Shipper, MD         ALLERGIES: Patient has no known allergies.  Family History  Problem Relation Age of Onset  . Adopted: Yes  . Osteoporosis Mother   . Stroke Mother   . Colon cancer Neg Hx     Social History   Social History  . Marital status: Widowed    Spouse name: N/A  . Number of children: N/A  . Years of education: N/A   Occupational History  . Not on file.   Social History Main Topics  . Smoking status: Never Smoker  . Smokeless tobacco: Never Used  . Alcohol use No  . Drug use: No  . Sexual activity: No     Comment: Tubal ligation   Other Topics Concern  . Not on file   Social History Narrative  . No narrative on file    ROS:  Pertinent items are noted in  HPI.  PHYSICAL EXAMINATION:    BP 140/70 (BP Location: Right Arm, Patient Position: Sitting, Cuff Size: Normal)   Pulse 70   Ht 5' 7.75" (1.721 m)   Wt 132 lb 6.4 oz (60.1 kg)   LMP 01/31/2006 (Approximate)   BMI 20.28 kg/m     General appearance: alert, cooperative and appears stated age  Pelvic: External genitalia:  no lesions              Urethra:  normal appearing urethra with no masses, tenderness or lesions              Bartholins and Skenes: normal                 Vagina: normal appearing vagina with normal color and discharge, no lesions.  Hymen with slight erythema posteriorly.  Second degree rectocele.               Cervix: no lesions                Bimanual Exam:  Uterus:  normal size, contour, position, consistency, mobility, non-tender              Adnexa: no mass, fullness, tenderness               Chaperone was present for exam.  ASSESSMENT  Status post bilateral oophorectomy.  Still has her uterus.  Rectocele.  Urinary frequency.  Osteoporosis.  On Prometrium through her hormone specialist.  PLAN  Continue pessary care.  Use water based lubricants prn.  Follow up every June for pessary check and sooner prn.  Call for any spontaneous vaginal bleeding.  She will see Dr. Mariea Clonts for her pap and annual exams. She declines medication for overactive bladder.  We discussed progesterone as an alternative treatment for osteoporosis.  I am not familiar with data supporting the use of this medication to reduce fracture risk.   An After Visit Summary was printed and given to the patient.  ___15___ minutes face to face time of which over 50% was spent in counseling.

## 2016-11-24 ENCOUNTER — Ambulatory Visit: Payer: Medicare HMO | Admitting: Obstetrics and Gynecology

## 2016-11-24 ENCOUNTER — Encounter: Payer: Self-pay | Admitting: Obstetrics and Gynecology

## 2016-11-24 VITALS — BP 140/70 | HR 70 | Ht 67.75 in | Wt 132.4 lb

## 2016-11-24 DIAGNOSIS — N816 Rectocele: Secondary | ICD-10-CM

## 2016-12-08 ENCOUNTER — Ambulatory Visit: Payer: Medicare HMO | Admitting: Internal Medicine

## 2016-12-08 ENCOUNTER — Encounter: Payer: Self-pay | Admitting: Internal Medicine

## 2016-12-08 VITALS — BP 120/70 | HR 81 | Temp 97.8°F | Wt 129.0 lb

## 2016-12-08 DIAGNOSIS — Z124 Encounter for screening for malignant neoplasm of cervix: Secondary | ICD-10-CM | POA: Diagnosis not present

## 2016-12-08 DIAGNOSIS — N816 Rectocele: Secondary | ICD-10-CM

## 2016-12-08 DIAGNOSIS — Z9289 Personal history of other medical treatment: Secondary | ICD-10-CM | POA: Diagnosis not present

## 2016-12-08 DIAGNOSIS — Z960 Presence of urogenital implants: Secondary | ICD-10-CM

## 2016-12-08 NOTE — Progress Notes (Signed)
Location:  [facility] clinic Provider:  Yurem Viner L. Mariea Clonts, D.O., C.M.D.  Goals of Care:  Advanced Directives 12/08/2016  Does Patient Have a Medical Advance Directive? No  Type of Advance Directive -  Copy of [place] in Chart? -  Would patient like information on creating a medical advance directive? No - Patient declined   Chief Complaint  Patient presents with  . Follow-up    pap smear    HPI: Patient is a 76 y.o. female seen today for pap smear.  There were payment issues getting this done at GYN.  Pt has pessary due to rectocele and gets it checked, cleaned, changed at Dr. Elza Rafter office.    Refuses flu shot.  Patient's last menstrual period was 01/31/2006 (approximate). Contraception:  Postmenopausal Menopausal hormone therapy:  none Last mammogram: Bilateral Mastectomy done in 1966 per patient Last pap smear: 06/09/2011 Pap smear negative. No abnormal pap smear.  She is going to start a "You go girl" class whenever you want.   She has her uterus, but had bilateral oophorectomy and prior tubal ligation.   She will remove her pessary for the pap as she does weekly to clean it.  Past Medical History:  Diagnosis Date  . Anxiety   . Cancer [facility])    Breast/ bilateral mastectomy  . 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 Osteoporosis     Past Surgical History:  Procedure Laterality Date  . BREAST SURGERY    . FOOT SURGERY    . MASTECTOMY    . NASAL SEPTUM SURGERY    . OOPHORECTOMY     Bil.oophorectomy  . TONSILLECTOMY AND ADENOIDECTOMY    . TUBAL LIGATION      No Known Allergies  Outpatient Encounter Medications as of 12/08/2016  Medication Sig  . ADK 5000-5000-500 UNIT-MCG CAPS Take by mouth daily.  [name] b complex vitamins tablet Take 1 tablet by mouth daily.  . Calcium Carb-Cholecalciferol (CALCIUM 1000 + D PO) Take by mouth.  . Coenzyme Q10 (COQ10 PO) Take by mouth.  . Lactobacillus (ACIDOPHILUS PO) Take by mouth daily.  .  Multiple Vitamins-Minerals (OSTEO COMPLEX PO) Take 2 tablets by mouth 2 (two) times daily.  . Nutritional Supplements (JUICE PLUS FIBRE PO) Take 1 tablet by mouth daily.  . progesterone (PROMETRIUM) 200 MG capsule Take 200 mg by mouth daily. Takes for osteoporosis  . Selenium 200 MCG CAPS Take by mouth daily.  . TURMERIC PO Take 1 tablet by mouth daily.   Facility-Administered Encounter Medications as of 12/08/2016  Medication  . 0.9 %  sodium chloride infusion    Review of Systems:  Review of Systems  Constitutional: Negative for chills and fever.  HENT: Negative for congestion and hearing loss.   Eyes: Negative for blurred vision.  Respiratory: Negative for shortness of breath.   Cardiovascular: Negative for chest pain, palpitations and leg swelling.  Gastrointestinal: Negative for abdominal pain, blood in stool, constipation and melena.  Genitourinary: Positive for urgency. Negative for dysuria and frequency.       Some incontinence, but minimal,   Musculoskeletal: Negative for falls.  Skin: Negative for itching and rash.  Neurological: Negative for dizziness and loss of consciousness.  Endo/Heme/Allergies: Does not bruise/bleed easily.  Psychiatric/Behavioral: Positive for memory loss. Negative for depression. The patient is nervous/anxious. The patient does not have insomnia.     Health Maintenance  Topic Date Due  . TETANUS/TDAP  01/31/2022  . DEXA SCAN  Completed  . PNA vac  Low Risk Adult  Completed    Physical Exam: Vitals:   12/08/16 0838  BP: 120/70  Pulse: 81  Temp: 97.8 F (36.6 C)  TempSrc: Oral  SpO2: 98%  Weight: 129 lb (58.5 kg)   Body mass index is 19.76 kg/m. Physical Exam  Constitutional: She is oriented to person, place, and time. She appears well-developed and well-nourished. No distress.  HENT:  Head: Normocephalic and atraumatic.  Cardiovascular: Normal rate, regular rhythm, normal heart sounds and intact distal pulses.  Pulmonary/Chest:  Effort normal and breath sounds normal. No respiratory distress.  Abdominal: Bowel sounds are normal. Hernia confirmed negative in the right inguinal area and confirmed negative in the left inguinal area.  Genitourinary: Vagina normal and uterus normal. Pelvic exam was performed with patient supine. There is no rash, tenderness, lesion or injury on the right labia. There is no rash, tenderness, lesion or injury on the left labia. Cervix exhibits no motion tenderness, no discharge and no friability. Right adnexum displays no mass, no tenderness and no fullness. Left adnexum displays no mass, no tenderness and no fullness. No erythema, tenderness or bleeding in the vagina. No foreign body in the vagina. No signs of injury around the vagina. No vaginal discharge found.  Genitourinary Comments: Posterior scarring of vagina; rectocele present  Musculoskeletal: Normal range of motion.  Lymphadenopathy: No inguinal adenopathy noted on the right or left side.  Neurological: She is alert and oriented to person, place, and time.  Skin: Skin is warm and dry.  Psychiatric:  Anxious, jittery    Labs reviewed: Basic Metabolic Panel: Recent Labs    09/19/16 1545  NA 142  K 4.2  CL 103  CO2 29  GLUCOSE 103*  BUN 15  CREATININE 0.66  CALCIUM 9.3  TSH 2.28   Liver Function Tests: Recent Labs    09/19/16 1545  AST 26  ALT 18  ALKPHOS 77  BILITOT 0.4  PROT 6.4  ALBUMIN 3.9   No results for input(s): LIPASE, AMYLASE in the last 8760 hours. No results for input(s): AMMONIA in the last 8760 hours. CBC: Recent Labs    09/19/16 1545  WBC 5.8  NEUTROABS 3,364  HGB 12.8  HCT 38.2  MCV 93.4  PLT 173   Lipid Panel: No results for input(s): CHOL, HDL, LDLCALC, TRIG, CHOLHDL, LDLDIRECT in the last 8760 hours. Lab Results  Component Value Date   HGBA1C 5.4 09/14/2015    Assessment/Plan 1. Pap smear for cervical cancer screening -performed today, was a bit challenging with rectocele in  view -will send her results when return  2. Rectocele -cont use of pessary with weekly cleanings and regular gyn visits for checks  3. Presence of pessary -pt removed for her pap smear -using for rectocele  Labs/tests ordered:  Pap thin prep with reflex hpv Next appt:  6 mos med mgt  Ceriah Kohler L. Myda Detwiler, D.O. [facility] [address], [place] [address] Cell Phone (Mon-Fri 8am-5pm):  [phone] On Call:  [phone] & follow prompts after 5pm & weekends Office Phone:  [phone] Office Fax:  [phone]

## 2016-12-08 NOTE — Addendum Note (Signed)
Addended by: Despina Hidden on: 12/08/2016 09:50 AM   Modules accepted: Orders

## 2016-12-13 LAB — PAP IG (IMAGE GUIDED)

## 2016-12-15 ENCOUNTER — Encounter: Payer: Self-pay | Admitting: *Deleted

## 2016-12-30 DIAGNOSIS — H16222 Keratoconjunctivitis sicca, not specified as Sjogren's, left eye: Secondary | ICD-10-CM | POA: Diagnosis not present

## 2016-12-30 DIAGNOSIS — H04122 Dry eye syndrome of left lacrimal gland: Secondary | ICD-10-CM | POA: Diagnosis not present

## 2016-12-30 DIAGNOSIS — H16221 Keratoconjunctivitis sicca, not specified as Sjogren's, right eye: Secondary | ICD-10-CM | POA: Diagnosis not present

## 2016-12-30 DIAGNOSIS — H04121 Dry eye syndrome of right lacrimal gland: Secondary | ICD-10-CM | POA: Diagnosis not present

## 2017-01-11 ENCOUNTER — Telehealth: Payer: Self-pay

## 2017-01-11 NOTE — Telephone Encounter (Signed)
Spoke with patient to schedule AWV-S. She could not find her planner and stated she would call back later this afternoon

## 2017-01-20 ENCOUNTER — Ambulatory Visit (INDEPENDENT_AMBULATORY_CARE_PROVIDER_SITE_OTHER): Payer: Medicare HMO

## 2017-01-20 VITALS — BP 132/60 | HR 80 | Temp 98.1°F | Ht 68.0 in | Wt 134.0 lb

## 2017-01-20 DIAGNOSIS — Z0000 Encounter for general adult medical examination without abnormal findings: Secondary | ICD-10-CM | POA: Diagnosis not present

## 2017-01-20 NOTE — Patient Instructions (Signed)
Yolanda Robbins , Thank you for taking time to come for your Medicare Wellness Visit. I appreciate your ongoing commitment to your health goals. Please review the following plan we discussed and let me know if I can assist you in the future.   Screening recommendations/referrals: Colonoscopy excluded, you are over age 76 Mammogram excluded, you are over age  Bone Density up to date Recommended yearly ophthalmology/optometry visit for glaucoma screening and checkup Recommended yearly dental visit for hygiene and checkup  Vaccinations: Influenza vaccine up to date. Due 2019 fall season Pneumococcal vaccine up to date Tdap vaccine up to date. Due 06/13/2022 Shingles vaccine due, let us know if you decide to get this   Advanced directives: Advance directive discussed with you today. I have provided a copy for you to complete at home and have notarized. Once this is complete please bring a copy in to our office so we can scan it into your chart.   Conditions/risks identified: none  Next appointment: Dr. Mariea Clonts 06/01/2017 @ 1pm             Tyson Dense, RN   Preventive Care 26 Years and Older, Female Preventive care refers to lifestyle choices and visits with your health care provider that can promote health and wellness. What does preventive care include?  A yearly physical exam. This is also called an annual well check.  Dental exams once or twice a year.  Routine eye exams. Ask your health care provider how often you should have your eyes checked.  Personal lifestyle choices, including:  Daily care of your teeth and gums.  Regular physical activity.  Eating a healthy diet.  Avoiding tobacco and drug use.  Limiting alcohol use.  Practicing safe sex.  Taking low-dose aspirin every day.  Taking vitamin and mineral supplements as recommended by your health care provider. What happens during an annual well check? The services and screenings done by your health care provider  during your annual well check will depend on your age, overall health, lifestyle risk factors, and family history of disease. Counseling  Your health care provider may ask you questions about your:  Alcohol use.  Tobacco use.  Drug use.  Emotional well-being.  Home and relationship well-being.  Sexual activity.  Eating habits.  History of falls.  Memory and ability to understand (cognition).  Work and work Statistician.  Reproductive health. Screening  You may have the following tests or measurements:  Height, weight, and BMI.  Blood pressure.  Lipid and cholesterol levels. These may be checked every 5 years, or more frequently if you are over 76 years old.  Skin check.  Lung cancer screening. You may have this screening every year starting at age 34 if you have a 30-pack-year history of smoking and currently smoke or have quit within the past 15 years.  Fecal occult blood test (FOBT) of the stool. You may have this test every year starting at age 63.  Flexible sigmoidoscopy or colonoscopy. You may have a sigmoidoscopy every 5 years or a colonoscopy every 10 years starting at age 78.  Hepatitis C blood test.  Hepatitis B blood test.  Sexually transmitted disease (STD) testing.  Diabetes screening. This is done by checking your blood sugar (glucose) after you have not eaten for a while (fasting). You may have this done every 1-3 years.  Bone density scan. This is done to screen for osteoporosis. You may have this done starting at age 46.  Mammogram. This may be done every  1-2 years. Talk to your health care provider about how often you should have regular mammograms. Talk with your health care provider about your test results, treatment options, and if necessary, the need for more tests. Vaccines  Your health care provider may recommend certain vaccines, such as:  Influenza vaccine. This is recommended every year.  Tetanus, diphtheria, and acellular pertussis  (Tdap, Td) vaccine. You may need a Td booster every 10 years.  Zoster vaccine. You may need this after age 31.  Pneumococcal 13-valent conjugate (PCV13) vaccine. One dose is recommended after age 57.  Pneumococcal polysaccharide (PPSV23) vaccine. One dose is recommended after age 17. Talk to your health care provider about which screenings and vaccines you need and how often you need them. This information is not intended to replace advice given to you by your health care provider. Make sure you discuss any questions you have with your health care provider. Document Released: 02/13/2015 Document Revised: 10/07/2015 Document Reviewed: 11/18/2014 Elsevier Interactive Patient Education  2017 [employer] Prevention in the Home Falls can cause injuries. They can happen to people of all ages. There are many things you can do to make your home safe and to help prevent falls. What can I do on the outside of my home?  Regularly fix the edges of walkways and driveways and fix any cracks.  Remove anything that might make you trip as you walk through a door, such as a raised step or threshold.  Trim any bushes or trees on the path to your home.  Use bright outdoor lighting.  Clear any walking paths of anything that might make someone trip, such as rocks or tools.  Regularly check to see if handrails are loose or broken. Make sure that both sides of any steps have handrails.  Any raised decks and porches should have guardrails on the edges.  Have any leaves, snow, or ice cleared regularly.  Use sand or salt on walking paths during winter.  Clean up any spills in your garage right away. This includes oil or grease spills. What can I do in the bathroom?  Use night lights.  Install grab bars by the toilet and in the tub and shower. Do not use towel bars as grab bars.  Use non-skid mats or decals in the tub or shower.  If you need to sit down in the shower, use a plastic, non-slip  stool.  Keep the floor dry. Clean up any water that spills on the floor as soon as it happens.  Remove soap buildup in the tub or shower regularly.  Attach bath mats securely with double-sided non-slip rug tape.  Do not have throw rugs and other things on the floor that can make you trip. What can I do in the bedroom?  Use night lights.  Make sure that you have a light by your bed that is easy to reach.  Do not use any sheets or blankets that are too big for your bed. They should not hang down onto the floor.  Have a firm chair that has side arms. You can use this for support while you get dressed.  Do not have throw rugs and other things on the floor that can make you trip. What can I do in the kitchen?  Clean up any spills right away.  Avoid walking on wet floors.  Keep items that you use a lot in easy-to-reach places.  If you need to reach something above you, use a strong  step stool that has a grab bar.  Keep electrical cords out of the way.  Do not use floor polish or wax that makes floors slippery. If you must use wax, use non-skid floor wax.  Do not have throw rugs and other things on the floor that can make you trip. What can I do with my stairs?  Do not leave any items on the stairs.  Make sure that there are handrails on both sides of the stairs and use them. Fix handrails that are broken or loose. Make sure that handrails are as long as the stairways.  Check any carpeting to make sure that it is firmly attached to the stairs. Fix any carpet that is loose or worn.  Avoid having throw rugs at the top or bottom of the stairs. If you do have throw rugs, attach them to the floor with carpet tape.  Make sure that you have a light switch at the top of the stairs and the bottom of the stairs. If you do not have them, ask someone to add them for you. What else can I do to help prevent falls?  Wear shoes that:  Do not have high heels.  Have rubber bottoms.  Are  comfortable and fit you well.  Are closed at the toe. Do not wear sandals.  If you use a stepladder:  Make sure that it is fully opened. Do not climb a closed stepladder.  Make sure that both sides of the stepladder are locked into place.  Ask someone to hold it for you, if possible.  Clearly mark and make sure that you can see:  Any grab bars or handrails.  First and last steps.  Where the edge of each step is.  Use tools that help you move around (mobility aids) if they are needed. These include:  Canes.  Walkers.  Scooters.  Crutches.  Turn on the lights when you go into a dark area. Replace any light bulbs as soon as they burn out.  Set up your furniture so you have a clear path. Avoid moving your furniture around.  If any of your floors are uneven, fix them.  If there are any pets around you, be aware of where they are.  Review your medicines with your doctor. Some medicines can make you feel dizzy. This can increase your chance of falling. Ask your doctor what other things that you can do to help prevent falls. This information is not intended to replace advice given to you by your health care provider. Make sure you discuss any questions you have with your health care provider. Document Released: 11/13/2008 Document Revised: 06/25/2015 Document Reviewed: 02/21/2014 Elsevier Interactive Patient Education  2017 [employer].

## 2017-01-20 NOTE — Progress Notes (Signed)
Subjective:   Yolanda Robbins is a 76 y.o. female who presents for Medicare Annual (Subsequent) preventive examination.  Last AWV-10/30/2014       Objective:     Vitals: BP (!) 144/62 (BP Location: Left Arm, Patient Position: Sitting)   Pulse 80   Temp 98.1 F (36.7 C) (Oral)   Ht 5\' 8"  (1.727 m)   Wt 134 lb (60.8 kg)   LMP 01/31/2006 (Approximate)   SpO2 98%   BMI 20.37 kg/m   Body mass index is 20.37 kg/m.  Advanced Directives 01/20/2017 12/08/2016 07/07/2016 07/04/2016 03/07/2016 02/05/2016 01/18/2016  Does Patient Have a Medical Advance Directive? No No No No No No No  Type of Advance Directive - - - - - - -  Copy of Healthcare Power of Attorney in Chart? - - - - - - -  Would patient like information on creating a medical advance directive? Yes (ED - Information included in AVS) No - Patient declined Yes (MAU/Ambulatory/Procedural Areas - Information given) No - Patient declined - Yes (ED - Information included in AVS) Yes (MAU/Ambulatory/Procedural Areas - Information given)    Tobacco Social History   Tobacco Use  Smoking Status Never Smoker  Smokeless Tobacco Never Used     Counseling given: Not Answered   Clinical Intake:  Pre-visit preparation completed: No  Pain : No/denies pain     Nutritional Risks: None Diabetes: No  How often do you need to have someone help you when you read instructions, pamphlets, or other written materials from your doctor or pharmacy?: 1 - Never What is the last grade level you completed in school?: Associates  Interpreter Needed?: No  Information entered by :: Tyson Dense, RN  Past Medical History:  Diagnosis Date  . Anxiety   . Cancer [facility])    Breast/ bilateral mastectomy  . 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 Osteoporosis    Past Surgical History:  Procedure Laterality Date  . BREAST SURGERY    . FOOT SURGERY    . MASTECTOMY    . NASAL SEPTUM SURGERY    . OOPHORECTOMY     Bil.oophorectomy  . TONSILLECTOMY AND ADENOIDECTOMY    . TUBAL LIGATION     Family History  Adopted: Yes  Problem Relation Age of Onset  . Osteoporosis Mother   . Stroke Mother   . Colon cancer Neg Hx    Social History   Socioeconomic History  . Marital status: Widowed    Spouse name: None  . Number of children: None  . Years of education: None  . Highest education level: None  Social Needs  . Financial resource strain: Not hard at all  . Food insecurity - worry: Never true  . Food insecurity - inability: Never true  . Transportation needs - medical: No  . Transportation needs - non-medical: No  Occupational History  . None  Tobacco Use  . Smoking status: Never Smoker  . Smokeless tobacco: Never Used  Substance and Sexual Activity  . Alcohol use: No  . Drug use: No  . Sexual activity: No    Birth control/protection: Post-menopausal, Surgical    Comment: Tubal ligation  Other Topics Concern  . None  Social History Narrative  . None    Outpatient Encounter Medications as of 01/20/2017  Medication Sig  . ADK 5000-5000-500 UNIT-MCG CAPS Take by mouth daily.  [name] b complex vitamins tablet Take 1 tablet by mouth daily.  . Calcium Carb-Cholecalciferol (CALCIUM 1000 + D  PO) Take by mouth.  . Coenzyme Q10 (COQ10 PO) Take by mouth.  . Lactobacillus (ACIDOPHILUS PO) Take by mouth daily.  . Multiple Vitamins-Minerals (OSTEO COMPLEX PO) Take 2 tablets by mouth 2 (two) times daily.  . Nutritional Supplements (JUICE PLUS FIBRE PO) Take 1 tablet by mouth daily.  . progesterone (PROMETRIUM) 200 MG capsule Take 200 mg by mouth daily. Takes for osteoporosis  . Selenium 200 MCG CAPS Take by mouth daily.  . TURMERIC PO Take 1 tablet by mouth daily.   Facility-Administered Encounter Medications as of 01/20/2017  Medication  . 0.9 %  sodium chloride infusion    Activities of Daily Living In your present state of health, do you have any difficulty performing the following  activities: 01/20/2017  Hearing? N  Vision? N  Difficulty concentrating or making decisions? Y  Walking or climbing stairs? N  Dressing or bathing? N  Doing errands, shopping? N  Preparing Food and eating ? N  Using the Toilet? N  In the past six months, have you accidently leaked urine? Y  Do you have problems with loss of bowel control? N  Managing your Medications? N  Managing your Finances? N  Housekeeping or managing your Housekeeping? N  Some recent data might be hidden    Patient Care Team: Gayland Curry, DO as PCP - General (Geriatric Medicine)    Assessment:   This is a routine wellness examination for [facility].  Exercise Activities and Dietary recommendations Current Exercise Habits: Structured exercise class, Type of exercise: Other - see comments(water aerobics), Time (Minutes): 30, Frequency (Times/Week): 3, Weekly Exercise (Minutes/Week): 90, Intensity: Mild, Exercise limited by: None identified  Goals    None      Fall Risk Fall Risk  01/20/2017 09/19/2016 06/16/2016 03/18/2016 02/05/2016  Falls in the past year? Yes No No No No  Number falls in past yr: 2 or more - - - -  Comment - - - - -  Injury with Fall? No - - - -  Follow up - - - - -   Is the patient's home free of loose throw rugs in walkways, pet beds, electrical cords, etc?   yes      Grab bars in the bathroom? yes      Handrails on the stairs?   yes      Adequate lighting?   yes  Timed Get Up and Go performed: 18 seconds, fall risk  Depression Screen PHQ 2/9 Scores 01/20/2017 09/19/2016 06/16/2016 03/18/2016  PHQ - 2 Score 0 0 0 0     Cognitive Function MMSE - Mini Mental State Exam 01/20/2017 01/18/2016 09/21/2015  Not completed: - (No Data) -  Orientation to time 5 - 5  Orientation to Place 5 - 4  Registration 3 - 3  Attention/ Calculation 5 - 5  Recall 2 - 2  Language- name 2 objects 2 - 2  Language- repeat 1 - 1  Language- follow 3 step command 3 - 2  Language- read & follow direction 1  - 1  Write a sentence 1 - 1  Copy design 1 - 1  Total score 29 - 27   Montreal Cognitive Assessment  12/04/2015  Visuospatial/ Executive (0/5) 4  Naming (0/3) 3  Attention: Read list of digits (0/2) 2  Attention: Read list of letters (0/1) 1  Attention: Serial 7 subtraction starting at 100 (0/3) 3  Language: Repeat phrase (0/2) 2  Language : Fluency (0/1) 1  Abstraction (0/2) 2  Delayed Recall (0/5) 4  Orientation (0/6) 6  Total 28      Immunization History  Administered Date(s) Administered  . Pneumococcal Conjugate-13 01/31/2013  . Pneumococcal Polysaccharide-23 02/01/2007  . Tdap 02/01/2012    Qualifies for Shingles Vaccine? Yes, educated. Declined for now  Screening Tests Health Maintenance  Topic Date Due  . TETANUS/TDAP  01/31/2022  . DEXA SCAN  Completed  . PNA vac Low Risk Adult  Completed    Cancer Screenings: Lung: Low Dose CT Chest recommended if Age 66-80 years, 30 pack-year currently smoking OR have quit w/in 15years. Patient does not qualify. Breast:  Up to date on Mammogram? Yes   Up to date of Bone Density/Dexa? Yes Colorectal: up to date  Additional Screenings:  Hepatitis B/HIV/Syphillis: Not indicated Hepatitis C Screening: Not indicated     Plan:    I have personally reviewed and addressed the Medicare Annual Wellness questionnaire and have noted the following in the patient's chart:  A. Medical and social history B. Use of alcohol, tobacco or illicit drugs  C. Current medications and supplements D. Functional ability and status E.  Nutritional status F.  Physical activity G. Advance directives H. List of other physicians I.  Hospitalizations, surgeries, and ER visits in previous 12 months J.  [place] to include hearing, vision, cognitive, depression L. Referrals and appointments - none  In addition, I have reviewed and discussed with patient certain preventive protocols, quality metrics, and best practice recommendations.  A written personalized care plan for preventive services as well as general preventive health recommendations were provided to patient.  See attached scanned questionnaire for additional information.   Signed,   Tyson Dense, RN Nurse Health Advisor   Quick Notes   Health Maintenance:      Abnormal Screen: MMSE 29/30. Did not pass clock drawing     Patient Concerns: none     Nurse Concerns: none

## 2017-02-07 DIAGNOSIS — Z79899 Other long term (current) drug therapy: Secondary | ICD-10-CM | POA: Diagnosis not present

## 2017-02-07 DIAGNOSIS — R252 Cramp and spasm: Secondary | ICD-10-CM | POA: Diagnosis not present

## 2017-03-03 ENCOUNTER — Other Ambulatory Visit: Payer: Self-pay

## 2017-03-03 ENCOUNTER — Emergency Department (HOSPITAL_COMMUNITY)
Admission: EM | Admit: 2017-03-03 | Discharge: 2017-03-03 | Disposition: A | Discharge status: Home / Self Care | Payer: Medicare HMO | Attending: Emergency Medicine | Admitting: Emergency Medicine

## 2017-03-03 ENCOUNTER — Encounter (HOSPITAL_COMMUNITY): Payer: Self-pay

## 2017-03-03 ENCOUNTER — Emergency Department (HOSPITAL_COMMUNITY): Payer: Medicare HMO

## 2017-03-03 DIAGNOSIS — Z853 Personal history of malignant neoplasm of breast: Secondary | ICD-10-CM | POA: Insufficient documentation

## 2017-03-03 DIAGNOSIS — M76892 Other specified enthesopathies of left lower limb, excluding foot: Secondary | ICD-10-CM | POA: Insufficient documentation

## 2017-03-03 DIAGNOSIS — M25552 Pain in left hip: Secondary | ICD-10-CM | POA: Diagnosis not present

## 2017-03-03 DIAGNOSIS — G8911 Acute pain due to trauma: Secondary | ICD-10-CM | POA: Diagnosis not present

## 2017-03-03 DIAGNOSIS — Z79899 Other long term (current) drug therapy: Secondary | ICD-10-CM | POA: Insufficient documentation

## 2017-03-03 DIAGNOSIS — R102 Pelvic and perineal pain: Secondary | ICD-10-CM | POA: Diagnosis not present

## 2017-03-03 MED ORDER — ACETAMINOPHEN 325 MG PO TABS
650.0000 mg | ORAL_TABLET | Freq: Once | ORAL | Status: AC
  Administered 2017-03-03: 650 mg via ORAL
  Filled 2017-03-03: qty 2

## 2017-03-03 NOTE — ED Notes (Signed)
Bed: WA01 Expected date:  Expected time:  Means of arrival:  Comments: 77 yo hip pain, non-traumatic

## 2017-03-03 NOTE — ED Provider Notes (Signed)
[facility] DEPT Provider Note   CSN: [id number] Arrival date & time: 03/03/17  1153     History   Chief Complaint Chief Complaint  Patient presents with  . Hip Pain    left    HPI Yolanda Robbins is a 77 y.o. female.  HPI 77 year old female presents with left hip pain starting 2 nights ago.  She went to exercise at the gym 3 days ago and then had a water aerobics class the morning of the pain starting.  That evening she started developing some left lateral hip pain.  It is worst when trying to get up and walk and bear weight.  She has not tried anything specific for the pain.  There have been no fevers, numbness or weakness.  No direct trauma or specific injury that she knows of.  Has not seen any bruising at the site.  Past Medical History:  Diagnosis Date  . Anxiety   . Cancer [facility])    Breast/ bilateral mastectomy  . 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 Osteoporosis     Patient Active Problem List   Diagnosis Date Noted  . Presence of pessary 12/08/2016  . Rectocele 09/19/2016  . 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 Memory changes 12/04/2015  . 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 Word finding difficulty 09/14/2015  . Lack of coordination 09/14/2015  . Urinary frequency 09/14/2015  . 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 Hyperglycemia 09/14/2015  . Vegetarian diet 09/14/2015    Past Surgical History:  Procedure Laterality Date  . BREAST SURGERY    . FOOT SURGERY    . MASTECTOMY    . NASAL SEPTUM SURGERY    . OOPHORECTOMY     Bil.oophorectomy  . TONSILLECTOMY AND ADENOIDECTOMY    . TUBAL LIGATION      OB History    Gravida Para Term Preterm AB Living   1 1 1  0 0 1   SAB TAB Ectopic Multiple Live Births   0 0 0 0 0       Home Medications    Prior to Admission medications   Medication Sig Start  Date End Date Taking? Authorizing Provider  ADK 5000-5000-500 UNIT-MCG CAPS Take by mouth daily.    [provider]  b complex vitamins tablet Take 1 tablet by mouth daily.    [provider]  Calcium Carb-Cholecalciferol (CALCIUM 1000 + D PO) Take by mouth.    [provider]  Coenzyme Q10 (COQ10 PO) Take by mouth.    [provider]  Lactobacillus (ACIDOPHILUS PO) Take by mouth daily.    [provider]  Multiple Vitamins-Minerals (OSTEO COMPLEX PO) Take 2 tablets by mouth 2 (two) times daily.    [provider]  Nutritional Supplements (JUICE PLUS FIBRE PO) Take 1 tablet by mouth daily.    [provider]  progesterone (PROMETRIUM) 200 MG capsule Take 200 mg by mouth daily. Takes for osteoporosis    [provider]  Selenium 200 MCG CAPS Take by mouth daily.    [provider]  TURMERIC PO Take 1 tablet by mouth daily.    [provider]    Family History Family History  Adopted: Yes  Problem Relation Age of Onset  . Osteoporosis Mother   . Stroke Mother   . Colon cancer Neg Hx     Social History Social History  Tobacco Use  . Smoking status: Never Smoker  . Smokeless tobacco: Never Used  Substance Use Topics  . Alcohol use: No  . Drug use: No     Allergies   Patient has no known allergies.   Review of Systems Review of Systems  Constitutional: Negative for fever.  Musculoskeletal: Positive for arthralgias.  Skin: Negative for color change and wound.  Neurological: Negative for weakness and numbness.  All other systems reviewed and are negative.    Physical Exam Updated Vital Signs BP 130/66 (BP Location: Right Arm)   Pulse 72   Temp 98.1 F (36.7 C) (Oral)   Resp 18   Ht 5\' 8"  (1.727 m)   Wt 60.8 kg (134 lb)   LMP 01/31/2006 (Approximate)   SpO2 99%   BMI 20.37 kg/m   Physical Exam  Constitutional: She is oriented to person, place, and time. She appears  well-developed and well-nourished.  HENT:  Head: Normocephalic and atraumatic.  Right Ear: External ear normal.  Left Ear: External ear normal.  Nose: Nose normal.  Eyes: Right eye exhibits no discharge. Left eye exhibits no discharge.  Cardiovascular: Normal rate and regular rhythm.  Pulses:      Dorsalis pedis pulses are 2+ on the left side.  Pulmonary/Chest: Effort normal.  Abdominal: She exhibits no distension.  Musculoskeletal:       Left hip: She exhibits tenderness. She exhibits normal range of motion and no swelling.       Legs: Normal strength and sensation in lower extremities.  X-ray is negative for acute fracture or dislocation.  Neurological: She is alert and oriented to person, place, and time.  Skin: Skin is warm and dry.  Nursing note and vitals reviewed.    ED Treatments / Results  Labs (all labs ordered are listed, but only abnormal results are displayed) Labs Reviewed - No data to display  EKG  EKG Interpretation None       Radiology Dg Hip Unilat With Pelvis 2-3 Views Left  Result Date: 03/03/2017 CLINICAL DATA:  Left hip pain. EXAM: DG HIP (WITH OR WITHOUT PELVIS) 2-3V LEFT COMPARISON:  None. FINDINGS: There is no evidence of hip fracture or dislocation. 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 There is no evidence of arthropathy or other focal bone abnormality. IMPRESSION: 1.  No acute osseous injury of the left hip. 2. 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 Electronically Signed   By: Kathreen Devoid   On: 03/03/2017 12:48    Procedures Procedures (including critical care time)  Medications Ordered in ED Medications  acetaminophen (TYLENOL) tablet 650 mg (not administered)     Initial Impression / Assessment and Plan / ED Course  I have reviewed the triage vital  signs and the nursing notes.  Pertinent labs & imaging results that were available during my care of the patient were reviewed by me and considered in my medical decision making (see chart for details).     There is some calcification adjacent to the left greater trochanter near where her pain is.  This could be a tendinitis from calcification and/or overuse from exercise.  My suspicion of an occult fracture is low however.  She is able to get up and ambulate and while she does ambulate with some pain and a mild limp, she is able to bear weight on the left lower extremity.  She can stand flat footed on her left foot with her right foot mostly in the air.  Discussed symptomatic care such as Tylenol and ice.  We also discussed calling the PCP for outpatient follow-up as well as setting up physical therapy which may be beneficial.  Otherwise appears well and neurovascular intact.  Discharge home with return precautions.  Final Clinical Impressions(s) / ED Diagnoses   Final diagnoses:  Hip tendinitis, left    ED Discharge Orders    None       Sherwood Gambler, MD 03/03/17 1319

## 2017-03-03 NOTE — ED Triage Notes (Signed)
Patient BIB EMS from home with complaint of left hip pain starting Wednesday evening after exercising (water aerobics) Wednesday morning. Patient with history of breast cancer with bilateral mastectomies. Patient maintains full ROM to BLE and able to ambulate for EMS without assistance, but with visible limp.  EMS VS: 140/60, HR70, RR18, Sat 95% RA

## 2017-03-09 ENCOUNTER — Ambulatory Visit (INDEPENDENT_AMBULATORY_CARE_PROVIDER_SITE_OTHER): Payer: Medicare HMO | Admitting: Internal Medicine

## 2017-03-09 ENCOUNTER — Encounter: Payer: Self-pay | Admitting: Internal Medicine

## 2017-03-09 VITALS — BP 124/68 | HR 75 | Temp 98.2°F | Ht 68.0 in | Wt 130.6 lb

## 2017-03-09 DIAGNOSIS — Z7189 Other specified counseling: Secondary | ICD-10-CM | POA: Diagnosis not present

## 2017-03-09 DIAGNOSIS — M7062 Trochanteric bursitis, left hip: Secondary | ICD-10-CM | POA: Diagnosis not present

## 2017-03-09 NOTE — ACP (Advance Care Planning) (Signed)
Pt here alone today. Discussed need for HCPOA and Living Will documents on file.  She does not currently have these.  I gave her a blank advance directive form.  Explained purpose of form and need for notarization.  She will then bring [place] a copy for our records to be scanned.   Currently, her code status is full code. 5 mins were spent today on this part of the visit.

## 2017-03-09 NOTE — Progress Notes (Signed)
Location:  [facility] clinic Provider: Benjamen Koelling L. Mariea Clonts, D.O., C.M.D.  Code Status: full code Goals of Care:  Advanced Directives 01/20/2017  Does Patient Have a Medical Advance Directive? No  Type of Advance Directive -  Copy of [place] in Chart? -  Would patient like information on creating a medical advance directive? Yes (ED - Information included in AVS)  reviewed living will and hcpoa documents with her and recommended completing.    Chief Complaint  Patient presents with  . Hospitalization Follow-up    ED follow-up, tendonitis in left leg  . ACP    No ACP    HPI: Patient is a 77 y.o. female seen today for ED f/u for left hip tendinitis.  Left hip xray with pelvis 2-3 views done.  Report showed:  1.  No acute osseous injury of the left hip.  2. 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 Tylenol and ice was recommended.  PT was also suggested, but advised to f/u with me for referral.   She saw her friend who is a reflexologist and she had a little inflammation left as of last night.  Also goes Monday for her massage with [employer].  Says she stepped over something yesterday with the left side when she should have used the right.  Has been using ice.  She's been doing 2-3 miles on the treadmill, but mon she did 4 miles and then wed night water aerobics.  Past Medical History:  Diagnosis Date  . Anxiety   . Cancer [facility])    Breast/ bilateral mastectomy  . 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 Osteoporosis     Past Surgical History:  Procedure Laterality Date  . BREAST SURGERY    . FOOT SURGERY    . MASTECTOMY    . NASAL SEPTUM SURGERY    . OOPHORECTOMY     Bil.oophorectomy  . TONSILLECTOMY AND ADENOIDECTOMY    . TUBAL LIGATION      No Known Allergies  Outpatient Encounter Medications as of 03/09/2017  Medication Sig  . ADK 5000-5000-500  UNIT-MCG CAPS Take by mouth daily.  [name] b complex vitamins tablet Take 1 tablet by mouth daily.  . Calcium Carb-Cholecalciferol (CALCIUM 1000 + D PO) Take by mouth.  . Cholecalciferol (VITAMIN D-3 PO) Take 2,500 Units by mouth daily. FROM ORGANIC LICHEN  . Coenzyme Q10 (COQ10 PO) Take by mouth.  . Lactobacillus (ACIDOPHILUS PO) Take by mouth daily.  [name] MAGNESIUM CITRATE PO Take 1 tablet by mouth daily.  . Multiple Vitamins-Minerals (OSTEO COMPLEX PO) Take 2 tablets by mouth 2 (two) times daily.  . Nutritional Supplements (JUICE PLUS FIBRE PO) Take 2 tablets by mouth daily. ORCHARD AND OMEGA JUICE PLUS IN THE MORNING  . OVER THE COUNTER MEDICATION Take 5 tablets by mouth daily. MASTER, AMINO ACID 1,000  . progesterone (PROMETRIUM) 200 MG capsule Take 200 mg by mouth daily. Takes for osteoporosis  . Selenium 200 MCG CAPS Take by mouth daily. SELENIUM PLUS VITAMIN E  . TURMERIC PO Take 1 tablet by mouth daily.  . [DISCONTINUED] 0.9 %  sodium chloride infusion    No facility-administered encounter medications on file as of 03/09/2017.     Review of Systems:  Review of Systems  Constitutional: Negative for chills and fever.  Cardiovascular: Negative for leg swelling.  Musculoskeletal: Positive for joint pain. Negative for falls.  Neurological: Negative for sensory change, focal  weakness and weakness.  Psychiatric/Behavioral: The patient is nervous/anxious.     Health Maintenance  Topic Date Due  . TETANUS/TDAP  01/31/2022  . DEXA SCAN  Completed  . PNA vac Low Risk Adult  Completed    Physical Exam: Vitals:   03/09/17 0859  BP: 124/68  Pulse: 75  Temp: 98.2 F (36.8 C)  TempSrc: Oral  SpO2: 96%  Weight: 130 lb 9.6 oz (59.2 kg)  Height: 5\' 8"  (1.727 m)   Body mass index is 19.86 kg/m. Physical Exam  Constitutional: She is oriented to person, place, and time. She appears well-developed. No distress.  Thin white female  Pulmonary/Chest: Effort normal. No respiratory distress.    Musculoskeletal: Normal range of motion. She exhibits no tenderness.  No current tenderness over left greater trochanter, hip, back or left buttock this am  Neurological: She is alert and oriented to person, place, and time.  Skin: Skin is warm and dry.  Psychiatric: She has a normal mood and affect.    Labs reviewed: Basic Metabolic Panel: Recent Labs    09/19/16 1545  NA 142  K 4.2  CL 103  CO2 29  GLUCOSE 103*  BUN 15  CREATININE 0.66  CALCIUM 9.3  TSH 2.28   Liver Function Tests: Recent Labs    09/19/16 1545  AST 26  ALT 18  ALKPHOS 77  BILITOT 0.4  PROT 6.4  ALBUMIN 3.9   No results for input(s): LIPASE, AMYLASE in the last 8760 hours. No results for input(s): AMMONIA in the last 8760 hours. CBC: Recent Labs    09/19/16 1545  WBC 5.8  NEUTROABS 3,364  HGB 12.8  HCT 38.2  MCV 93.4  PLT 173   Lipid Panel: No results for input(s): CHOL, HDL, LDLCALC, TRIG, CHOLHDL, LDLDIRECT in the last 8760 hours. Lab Results  Component Value Date   HGBA1C 5.4 09/14/2015    Procedures since last visit: Dg Hip Unilat With Pelvis 2-3 Views Left  Result Date: 03/03/2017 CLINICAL DATA:  Left hip pain. EXAM: DG HIP (WITH OR WITHOUT PELVIS) 2-3V LEFT COMPARISON:  None. FINDINGS: There is no evidence of hip fracture or dislocation. 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 There is no evidence of arthropathy or other focal bone abnormality. IMPRESSION: 1.  No acute osseous injury of the left hip. 2. 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 Electronically Signed   By: Kathreen Devoid   On: 03/03/2017 12:48    Assessment/Plan 1. Peri-trochanteric left hip tendinitis -will get her massage, decrease treadmill for now and continue with water aerobics light exercise -return if pain returns or flares up  again and she wants PT, does not at this time  2. Advance care planning Last Advance Care Planning (ACP) Note    Gayland Curry, DO (Physician) 03/09/2017 10:04  Geriatrics         Pt here alone today. Discussed need for HCPOA and Living Will documents on file.  She does not currently have these.  I gave her a blank advance directive form.  Explained purpose of form and need for notarization.  She will then bring [place] a copy for our records to be scanned.   Currently, her code status is full code. 5 mins were spent today on this part of the visit.     ED notes and imaging reviewed.  Labs/tests ordered:  No  new Next appt:  06/01/2017-keep as scheduled  Romina Divirgilio L. Marlisha Vanwyk, D.O. [facility] [address], [place] [address] Cell Phone (Mon-Fri 8am-5pm):  [phone] On Call:  [phone] & follow prompts after 5pm & weekends Office Phone:  [phone] Office Fax:  [phone]

## 2017-05-30 DIAGNOSIS — D510 Vitamin B12 deficiency anemia due to intrinsic factor deficiency: Secondary | ICD-10-CM | POA: Diagnosis not present

## 2017-05-30 DIAGNOSIS — E782 Mixed hyperlipidemia: Secondary | ICD-10-CM | POA: Diagnosis not present

## 2017-05-30 DIAGNOSIS — E559 Vitamin D deficiency, unspecified: Secondary | ICD-10-CM | POA: Diagnosis not present

## 2017-05-30 DIAGNOSIS — R82998 Other abnormal findings in urine: Secondary | ICD-10-CM | POA: Diagnosis not present

## 2017-05-30 DIAGNOSIS — Z79899 Other long term (current) drug therapy: Secondary | ICD-10-CM | POA: Diagnosis not present

## 2017-05-30 DIAGNOSIS — E039 Hypothyroidism, unspecified: Secondary | ICD-10-CM | POA: Diagnosis not present

## 2017-06-01 ENCOUNTER — Ambulatory Visit (INDEPENDENT_AMBULATORY_CARE_PROVIDER_SITE_OTHER): Payer: Medicare HMO | Admitting: Internal Medicine

## 2017-06-01 ENCOUNTER — Encounter: Payer: Self-pay | Admitting: Internal Medicine

## 2017-06-01 VITALS — BP 128/60 | HR 74 | Temp 98.4°F | Ht 68.0 in | Wt 131.0 lb

## 2017-06-01 DIAGNOSIS — M810 Age-related osteoporosis without current pathological fracture: Secondary | ICD-10-CM | POA: Diagnosis not present

## 2017-06-01 DIAGNOSIS — R350 Frequency of micturition: Secondary | ICD-10-CM

## 2017-06-01 DIAGNOSIS — Z13220 Encounter for screening for lipoid disorders: Secondary | ICD-10-CM

## 2017-06-01 DIAGNOSIS — R058 Other specified cough: Secondary | ICD-10-CM

## 2017-06-01 DIAGNOSIS — R739 Hyperglycemia, unspecified: Secondary | ICD-10-CM

## 2017-06-01 DIAGNOSIS — R05 Cough: Secondary | ICD-10-CM

## 2017-06-01 DIAGNOSIS — F422 Mixed obsessional thoughts and acts: Secondary | ICD-10-CM | POA: Diagnosis not present

## 2017-06-01 DIAGNOSIS — E890 Postprocedural hypothyroidism: Secondary | ICD-10-CM

## 2017-06-01 NOTE — Progress Notes (Signed)
Location:  [facility] clinic Provider:  Yovanna Cogan L. Mariea Clonts, D.O., C.M.D.  Code Status: full code Goals of Care:  Advanced Directives 06/01/2017  Does Patient Have a Medical Advance Directive? No  Type of Advance Directive -  Copy of [place] in Chart? -  Would patient like information on creating a medical advance directive? Yes (MAU/Ambulatory/Procedural Areas - Information given)  given ACP document before but has not completed   Chief Complaint  Patient presents with  . Medical Management of Chronic Issues    55mth follow-up    HPI: Patient is a 77 y.o. female seen today for medical management of chronic diseases.    Hypothyroidism:  Not on meds and needs tsh.  Osteoporosis:  On ca with D and additional D, does weightbearing exercise.  Overdid her exercise and had to go to the ED for her hip once (I saw her after that). She's going to try to keep walking and go to water aerobics.  She is busy with other things and her daughter with whom she goes has been ill.  Urinary frequency:  Has had rectocele, using pessary.  Recent UTI dx and now on bactrim DS through Dr. Sharlett Iles (her daughter sees him, but urine tested there b/c of an ammonia smell).    She is now having a little hacking cough since yesterday off and on.  Is tired and stressed b/c she misplaced something with her income tax and she's having increased meetings at church.   She's been having some drainage in the mornings for a few weeks--small amount.  Discussed nasal saline and gargles.  Also has mold near her washer and dryer she needs taken care of.  Discussed this may affect allergy and cough.  She has an abscessed tooth that the dentist is addressing.  Past Medical History:  Diagnosis Date  . Anxiety   . Cancer [facility])    Breast/ bilateral mastectomy  . 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 Osteoporosis     Past Surgical History:  Procedure Laterality Date  . BREAST SURGERY      . FOOT SURGERY    . MASTECTOMY    . NASAL SEPTUM SURGERY    . OOPHORECTOMY     Bil.oophorectomy  . TONSILLECTOMY AND ADENOIDECTOMY    . TUBAL LIGATION      No Known Allergies  Outpatient Encounter Medications as of 06/01/2017  Medication Sig  . ADK 5000-5000-500 UNIT-MCG CAPS Take by mouth daily.  [name] b complex vitamins tablet Take 1 tablet by mouth daily.  . Calcium Carb-Cholecalciferol (CALCIUM 1000 + D PO) Take by mouth.  . Cholecalciferol (VITAMIN D-3 PO) Take 2,500 Units by mouth daily. FROM ORGANIC LICHEN  . Coenzyme Q10 (COQ10 PO) Take by mouth.  . Lactobacillus (ACIDOPHILUS PO) Take by mouth daily.  [name] MAGNESIUM CITRATE PO Take 1 tablet by mouth daily.  . Multiple Vitamins-Minerals (OSTEO COMPLEX PO) Take 2 tablets by mouth 2 (two) times daily.  . Nutritional Supplements (JUICE PLUS FIBRE PO) Take 2 tablets by mouth daily. ORCHARD AND OMEGA JUICE PLUS IN THE MORNING  . OVER THE COUNTER MEDICATION Take 5 tablets by mouth daily. MASTER, AMINO ACID 1,000  . progesterone (PROMETRIUM) 200 MG capsule Take 200 mg by mouth daily. Takes for osteoporosis  . Selenium 200 MCG CAPS Take by mouth daily. SELENIUM PLUS VITAMIN E  . sulfamethoxazole-trimethoprim (BACTRIM DS,SEPTRA DS) 800-160 MG tablet Take 1 tablet by mouth 2 (two) times daily.   . [DISCONTINUED]  TURMERIC PO Take 1 tablet by mouth daily.   No facility-administered encounter medications on file as of 06/01/2017.     Review of Systems:  Review of Systems  Constitutional: Positive for malaise/fatigue. Negative for chills.  HENT: Negative for congestion and sinus pain.        Postnasal drip  Eyes: Negative for blurred vision.  Respiratory: Positive for cough. Negative for hemoptysis, sputum production, shortness of breath and wheezing.   Cardiovascular: Negative for chest pain, palpitations and leg swelling.  Gastrointestinal: Negative for abdominal pain, blood in stool, constipation, diarrhea and melena.  Genitourinary:  Positive for frequency and urgency. Negative for dysuria, flank pain and hematuria.       Foul odor to urine  Musculoskeletal: Negative for falls and joint pain.  Skin: Negative for itching and rash.  Neurological: Negative for dizziness and loss of consciousness.  Endo/Heme/Allergies: Bruises/bleeds easily.  Psychiatric/Behavioral: Positive for memory loss.    Health Maintenance  Topic Date Due  . TETANUS/TDAP  01/31/2022  . DEXA SCAN  Completed  . PNA vac Low Risk Adult  Completed    Physical Exam: Vitals:   06/01/17 1307  BP: 128/60  Pulse: 74  Temp: 98.4 F (36.9 C)  TempSrc: Oral  SpO2: 96%  Weight: 131 lb (59.4 kg)  Height: 5\' 8"  (1.727 m)   Body mass index is 19.92 kg/m. Physical Exam  Constitutional: She is oriented to person, place, and time. She appears well-developed.  Thin female  HENT:  Head: Normocephalic and atraumatic.  Right Ear: External ear normal.  Left Ear: External ear normal.  Nose: Nose normal.  Clear post-nasal drip  Eyes: Conjunctivae are normal.  Neck: Neck supple.  Cardiovascular: Normal rate, regular rhythm, normal heart sounds and intact distal pulses.  Pulmonary/Chest: Effort normal and breath sounds normal. No stridor. No respiratory distress. She has no wheezes. She has no rales.  Occasional dry cough  Musculoskeletal: Normal range of motion.  Neurological: She is alert and oriented to person, place, and time.  Skin: Capillary refill takes less than 2 seconds.  More prominent circles under eyes  Psychiatric:  Anxious and tremulous    Labs reviewed: Basic Metabolic Panel: Recent Labs    09/19/16 1545  NA 142  K 4.2  CL 103  CO2 29  GLUCOSE 103*  BUN 15  CREATININE 0.66  CALCIUM 9.3  TSH 2.28   Liver Function Tests: Recent Labs    09/19/16 1545  AST 26  ALT 18  ALKPHOS 77  BILITOT 0.4  PROT 6.4  ALBUMIN 3.9   No results for input(s): LIPASE, AMYLASE in the last 8760 hours. No results for input(s): AMMONIA in  the last 8760 hours. CBC: Recent Labs    09/19/16 1545  WBC 5.8  NEUTROABS 3,364  HGB 12.8  HCT 38.2  MCV 93.4  PLT 173   Lipid Panel: No results for input(s): CHOL, HDL, LDLCALC, TRIG, CHOLHDL, LDLDIRECT in the last 8760 hours. Lab Results  Component Value Date   HGBA1C 5.4 09/14/2015   Assessment/Plan 1. Hyperglycemia - cont healthy diet and exercise , f/u labs before CPE in Nov - CBC with Differential/Platelet; Future - COMPLETE METABOLIC PANEL WITH GFR; Future - Hemoglobin A1c; Future  2. Senile osteoporosis -ongoing, has refused meds, continues on ca with D and weightbearing exercise  3. Urinary frequency -with ammonia odor--Dr. Sharlett Iles gave her antibiotics for a UTI when a urine sample was supplied at his office (daughter is pt there) - CBC with Differential/Platelet;  Future  4. Postablative hypothyroidism -not on medication for this, last tsh normal, f/u before CPE - TSH; Future  5. Mixed obsessional thoughts and acts -ongoing, came off zoloft at integrative med advice, remains anxious and tremulous, current obsessing over her taxes that she got an extension on them and some church-related programs she's involved with--has too much to do and overwhelmed  6. Lipid screening - due for annual recheck after August - Lipid panel; Future  7. Dry cough -counseled that this is likely allergy-related-given these instructions by me:   Use saline nasal spray or flushes to help with congestion.  Also use salt water gargles for postnasal drip.  If the dry cough persists, you may use over the counter zyrtec or claritin to help it, but I know you're not a fan of pills.    Labs/tests ordered:   Orders Placed This Encounter  Procedures  . Lipid panel    Standing Status:   Future    Standing Expiration Date:   06/02/2018  . CBC with Differential/Platelet    Standing Status:   Future    Standing Expiration Date:   06/02/2018  . COMPLETE METABOLIC PANEL WITH GFR    Standing  Status:   Future    Standing Expiration Date:   06/02/2018  . Hemoglobin A1c    Standing Status:   Future    Standing Expiration Date:   06/02/2018  . TSH    Standing Status:   Future    Standing Expiration Date:   06/02/2018    Next appt:  6 mos for CPE with labs before  Fatema Rabe L. Grey Rakestraw, D.O. [facility] [address], [place] [address] Cell Phone (Mon-Fri 8am-5pm):  [phone] On Call:  [phone] & follow prompts after 5pm & weekends Office Phone:  [phone] Office Fax:  [phone]

## 2017-06-01 NOTE — Patient Instructions (Signed)
Use saline nasal spray or flushes to help with congestion.  Also use salt water gargles for postnasal drip.  If the dry cough persists, you may use over the counter zyrtec or claritin to help it, but I know you're not a fan of pills.

## 2017-07-12 NOTE — Progress Notes (Deleted)
GYNECOLOGY  VISIT   HPI: 77 y.o.   Widowed  Caucasian  female   G1P1001 with Patient's last menstrual period was 01/31/2006 (approximate).   here for     GYNECOLOGIC HISTORY: Patient's last menstrual period was 01/31/2006 (approximate). Contraception:  Postmenopausal Menopausal hormone therapy:  none Last mammogram:  Bilateral Mastectomy done in 1966 pre patient Last pap smear:   12/08/16 Pap smear negative        OB History    Gravida  1   Para  1   Term  1   Preterm  0   AB  0   Living  1     SAB  0   TAB  0   Ectopic  0   Multiple  0   Live Births  0              Patient Active Problem List   Diagnosis Date Noted  . Presence of pessary 12/08/2016  . Rectocele 09/19/2016  . 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 Memory changes 12/04/2015  . 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 Word finding difficulty 09/14/2015  . Lack of coordination 09/14/2015  . Urinary frequency 09/14/2015  . 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 Hyperglycemia 09/14/2015  . Vegetarian diet 09/14/2015    Past Medical History:  Diagnosis Date  . Anxiety   . Cancer [facility])    Breast/ bilateral mastectomy  . 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 Osteoporosis     Past Surgical History:  Procedure Laterality Date  . BREAST SURGERY    . FOOT SURGERY    . MASTECTOMY    . NASAL SEPTUM SURGERY    . OOPHORECTOMY     Bil.oophorectomy  . TONSILLECTOMY AND ADENOIDECTOMY    . TUBAL LIGATION      Current Outpatient Medications  Medication Sig Dispense Refill  . ADK 5000-5000-500 UNIT-MCG CAPS Take by mouth daily.    [name] b complex vitamins tablet Take 1 tablet by mouth daily.    . Calcium Carb-Cholecalciferol (CALCIUM 1000 + D PO) Take by mouth.    . Cholecalciferol (VITAMIN D-3 PO) Take 2,500 Units by mouth daily. FROM ORGANIC LICHEN    . Coenzyme Q10 (COQ10 PO)  Take by mouth.    . Lactobacillus (ACIDOPHILUS PO) Take by mouth daily.    [name] MAGNESIUM CITRATE PO Take 1 tablet by mouth daily.    . Multiple Vitamins-Minerals (OSTEO COMPLEX PO) Take 2 tablets by mouth 2 (two) times daily.    . Nutritional Supplements (JUICE PLUS FIBRE PO) Take 2 tablets by mouth daily. ORCHARD AND OMEGA JUICE PLUS IN THE MORNING    . OVER THE COUNTER MEDICATION Take 5 tablets by mouth daily. MASTER, AMINO ACID 1,000    . progesterone (PROMETRIUM) 200 MG capsule Take 200 mg by mouth daily. Takes for osteoporosis    . Selenium 200 MCG CAPS Take by mouth daily. SELENIUM PLUS VITAMIN E    . sulfamethoxazole-trimethoprim (BACTRIM DS,SEPTRA DS) 800-160 MG tablet Take 1 tablet by mouth 2 (two) times daily.      No current facility-administered medications for this visit.      ALLERGIES: Patient has no known allergies.  Family History  Adopted: Yes  Problem Relation Age of Onset  . Osteoporosis Mother   . Stroke Mother   . Colon cancer Neg Hx     Social History   Socioeconomic History  .  Marital status: Widowed    Spouse name: Not on file  . Number of children: Not on file  . Years of education: Not on file  . Highest education level: Not on file  Occupational History  . Not on file  Social Needs  . Financial resource strain: Not hard at all  . Food insecurity:    Worry: Never true    Inability: Never true  . Transportation needs:    Medical: No    Non-medical: No  Tobacco Use  . Smoking status: Never Smoker  . Smokeless tobacco: Never Used  Substance and Sexual Activity  . Alcohol use: No  . Drug use: No  . Sexual activity: Never    Birth control/protection: Post-menopausal, Surgical    Comment: Tubal ligation  Lifestyle  . Physical activity:    Days per week: 3 days    Minutes per session: 30 min  . Stress: Only a little  Relationships  . Social connections:    Talks on phone: More than three times a week    Gets together: More than three times a  week    Attends religious service: More than 4 times per year    Active member of club or organization: Yes    Attends meetings of clubs or organizations: More than 4 times per year    Relationship status: Widowed  . Intimate partner violence:    Fear of current or ex partner: No    Emotionally abused: No    Physically abused: No    Forced sexual activity: No  Other Topics Concern  . Not on file  Social History Narrative  . Not on file    Review of Systems  PHYSICAL EXAMINATION:    LMP 01/31/2006 (Approximate)     General appearance: alert, cooperative and appears stated age Head: Normocephalic, without obvious abnormality, atraumatic Neck: no adenopathy, supple, symmetrical, trachea midline and thyroid normal to inspection and palpation Lungs: clear to auscultation bilaterally Breasts: normal appearance, no masses or tenderness, No nipple retraction or dimpling, No nipple discharge or bleeding, No axillary or supraclavicular adenopathy Heart: regular rate and rhythm Abdomen: soft, non-tender, no masses,  no organomegaly Extremities: extremities normal, atraumatic, no cyanosis or edema Skin: Skin color, texture, turgor normal. No rashes or lesions Lymph nodes: Cervical, supraclavicular, and axillary nodes normal. No abnormal inguinal nodes palpated Neurologic: Grossly normal  Pelvic: External genitalia:  no lesions              Urethra:  normal appearing urethra with no masses, tenderness or lesions              Bartholins and Skenes: normal                 Vagina: normal appearing vagina with normal color and discharge, no lesions              Cervix: no lesions                Bimanual Exam:  Uterus:  normal size, contour, position, consistency, mobility, non-tender              Adnexa: no mass, fullness, tenderness              Rectal exam: {yes no:314532}.  Confirms.              Anus:  normal sphincter tone, no lesions  Chaperone was present for  exam.  ASSESSMENT     PLAN  An After Visit Summary was printed and given to the patient.  ______ minutes face to face time of which over 50% was spent in counseling.

## 2017-07-13 ENCOUNTER — Telehealth: Payer: Self-pay | Admitting: Obstetrics and Gynecology

## 2017-07-13 ENCOUNTER — Encounter: Payer: Self-pay | Admitting: Obstetrics and Gynecology

## 2017-07-13 ENCOUNTER — Ambulatory Visit: Payer: Medicare HMO | Admitting: Obstetrics and Gynecology

## 2017-07-13 NOTE — Telephone Encounter (Signed)
Left message on voicemail regarding missed follow up appointment.

## 2017-07-13 NOTE — Telephone Encounter (Signed)
Thank you for reaching out to have patient come in for an appointment.

## 2017-07-13 NOTE — Telephone Encounter (Signed)
Patient called back and said she had canceled her appointment via the reminder call  text. I informed the patient we do not receive incoming text. Patient is away in [place] and will call later to reschedule.

## 2017-07-13 NOTE — Telephone Encounter (Signed)
Thank you for the update.  Encounter closed. 

## 2017-10-05 ENCOUNTER — Ambulatory Visit (INDEPENDENT_AMBULATORY_CARE_PROVIDER_SITE_OTHER): Payer: Medicare HMO | Admitting: Obstetrics and Gynecology

## 2017-10-05 ENCOUNTER — Encounter: Payer: Self-pay | Admitting: Obstetrics and Gynecology

## 2017-10-05 VITALS — BP 122/70 | HR 72 | Resp 16 | Ht 67.75 in | Wt 128.0 lb

## 2017-10-05 DIAGNOSIS — R21 Rash and other nonspecific skin eruption: Secondary | ICD-10-CM | POA: Diagnosis not present

## 2017-10-05 DIAGNOSIS — N816 Rectocele: Secondary | ICD-10-CM | POA: Diagnosis not present

## 2017-10-05 DIAGNOSIS — M79622 Pain in left upper arm: Secondary | ICD-10-CM

## 2017-10-05 DIAGNOSIS — Z4689 Encounter for fitting and adjustment of other specified devices: Secondary | ICD-10-CM | POA: Diagnosis not present

## 2017-10-05 MED ORDER — TRIAMCINOLONE ACETONIDE 0.025 % EX OINT
1.0000 | TOPICAL_OINTMENT | Freq: Two times a day (BID) | CUTANEOUS | 0 refills | Status: DC
Start: 2017-10-05 — End: 2018-01-26

## 2017-10-05 NOTE — Progress Notes (Signed)
GYNECOLOGY  VISIT   HPI: 77 y.o.   Widowed  Caucasian  female   G1P1001 with Patient's last menstrual period was 01/31/2006 (approximate).   here for pessary check; left breast tenderness and a lump.   Taking pessary out every couple of weeks.  No pelvic pain.  No vaginal bleeding.  Can notice some red discharge on her sanitary pad a couple of times, not in a while.  This only occurs with removing or replacing the pessary.  A little bit of yellow discharge recently.   No problems with bowel control.  Did a class on bladder control.  Stopped using pads for a while.  Now using only panty liner.  Leak with cough, laugh, or sneeze a couple of times.   Satisfied with pessary for her care.   Taking Prometrium daily for osteoporosis.   GYNECOLOGIC HISTORY: Patient's last menstrual period was 01/31/2006 (approximate). Contraception:  Postmenopausal Menopausal hormone therapy:  none Last mammogram:  Bilateral Mastectomy done in 1966 per patient Last pap smear:   12/08/16 Normal        OB History    Gravida  1   Para  1   Term  1   Preterm  0   AB  0   Living  1     SAB  0   TAB  0   Ectopic  0   Multiple  0   Live Births  0              Patient Active Problem List   Diagnosis Date Noted  . Presence of pessary 12/08/2016  . Rectocele 09/19/2016  . 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 Memory changes 12/04/2015  . 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 Word finding difficulty 09/14/2015  . Lack of coordination 09/14/2015  . Urinary frequency 09/14/2015  . 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 Hyperglycemia 09/14/2015  . Vegetarian diet 09/14/2015    Past Medical History:  Diagnosis Date  . Anxiety   . Cancer [facility])    Breast/ bilateral mastectomy  . 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 Osteoporosis     Past Surgical History:   Procedure Laterality Date  . BREAST SURGERY    . FOOT SURGERY    . MASTECTOMY    . NASAL SEPTUM SURGERY    . OOPHORECTOMY     Bil.oophorectomy  . TONSILLECTOMY AND ADENOIDECTOMY    . TUBAL LIGATION      Current Outpatient Medications  Medication Sig Dispense Refill  . ADK 5000-5000-500 UNIT-MCG CAPS Take by mouth daily.    [name] b complex vitamins tablet Take 1 tablet by mouth daily.    . Calcium Carb-Cholecalciferol (CALCIUM 1000 + D PO) Take by mouth.    . Cholecalciferol (VITAMIN D-3 PO) Take 2,500 Units by mouth daily. FROM ORGANIC LICHEN    . Coenzyme Q10 (COQ10 PO) Take by mouth.    . Iodine Strong, Lugols, (IODINE STRONG PO) Take by mouth daily.    . Lactobacillus (ACIDOPHILUS PO) Take by mouth daily.    [name] MAGNESIUM CITRATE PO Take 1 tablet by mouth daily.    . Multiple Vitamins-Minerals (OSTEO COMPLEX PO) Take 2 tablets by mouth 2 (two) times daily.    . Nutritional Supplements (JUICE PLUS FIBRE PO) Take 2 tablets by mouth daily. ORCHARD AND OMEGA JUICE PLUS IN THE MORNING    . OVER THE COUNTER MEDICATION Take 5  tablets by mouth daily. MASTER, AMINO ACID 1,000    . progesterone (PROMETRIUM) 200 MG capsule Take 200 mg by mouth daily. Takes for osteoporosis    . Selenium 200 MCG CAPS Take by mouth daily. SELENIUM PLUS VITAMIN E     No current facility-administered medications for this visit.      ALLERGIES: Patient has no known allergies.  Family History  Adopted: Yes  Problem Relation Age of Onset  . Osteoporosis Mother   . Stroke Mother   . Colon cancer Neg Hx     Social History   Socioeconomic History  . Marital status: Widowed    Spouse name: Not on file  . Number of children: Not on file  . Years of education: Not on file  . Highest education level: Not on file  Occupational History  . Not on file  Social Needs  . Financial resource strain: Not hard at all  . Food insecurity:    Worry: Never true    Inability: Never true  . Transportation needs:     Medical: No    Non-medical: No  Tobacco Use  . Smoking status: Never Smoker  . Smokeless tobacco: Never Used  Substance and Sexual Activity  . Alcohol use: No  . Drug use: No  . Sexual activity: Never    Birth control/protection: Post-menopausal, Surgical    Comment: Tubal ligation  Lifestyle  . Physical activity:    Days per week: 3 days    Minutes per session: 30 min  . Stress: Only a little  Relationships  . Social connections:    Talks on phone: More than three times a week    Gets together: More than three times a week    Attends religious service: More than 4 times per year    Active member of club or organization: Yes    Attends meetings of clubs or organizations: More than 4 times per year    Relationship status: Widowed  . Intimate partner violence:    Fear of current or ex partner: No    Emotionally abused: No    Physically abused: No    Forced sexual activity: No  Other Topics Concern  . Not on file  Social History Narrative  . Not on file    Review of Systems  Constitutional:       Left breast tenderness  All other systems reviewed and are negative.   PHYSICAL EXAMINATION:    BP 122/70 (BP Location: Right Arm, Patient Position: Sitting, Cuff Size: Normal)   Pulse 72   Resp 16   Ht 5' 7.75" (1.721 m)   Wt 128 lb (58.1 kg)   LMP 01/31/2006 (Approximate)   BMI 19.61 kg/m     General appearance: alert, cooperative and appears stated age   Breasts:  Absent with reconstruction.  Left  Axillary tenderness.  Small 4 -5 mm node palpable? No right axillary masses.  Pelvic: External genitalia:   2.5 cm patch of scaly mildly erythematous area of left mons.               Urethra:  normal appearing urethra with no masses, tenderness or lesions              Bartholins and Skenes: normal                 Vagina: normal appearing vagina with normal color and discharge, no lesions.  Second degree rectocele.  Cervix: no lesions                 Bimanual Exam:  Uterus:  normal size, contour, position, consistency, mobility, non-tender              Adnexa: no mass, fullness, tenderness         Chaperone was present for exam.  ASSESSMENT  Status post bilateral oophorectomy.  Still has her uterus.  Rectocele.  Status post bilateral mastectomy with reconstruction.  Left axillary tenderness and possible node.  Skin dermatitis.   PLAN  Continue pessary care.  Left axillary ultrasound.  Triamcinolone ointment to vulva bid prn.   Return yearly and prn.    An After Visit Summary was printed and given to the patient.  __25____ minutes face to face time of which over 50% was spent in counseling.

## 2017-10-05 NOTE — Progress Notes (Signed)
Patient scheduled while in office for left axilla [place] at [place] on 10/10/17 at 7:40am, arriving at 7:20am. Patient verbalizes understanding and is agreeable.

## 2017-10-10 ENCOUNTER — Other Ambulatory Visit: Payer: Medicare HMO

## 2017-10-13 ENCOUNTER — Ambulatory Visit
Admission: RE | Admit: 2017-10-13 | Discharge: 2017-10-13 | Disposition: A | Discharge status: Home / Self Care | Payer: Medicare HMO | Source: Ambulatory Visit | Attending: Obstetrics and Gynecology | Admitting: Obstetrics and Gynecology

## 2017-10-13 DIAGNOSIS — M79622 Pain in left upper arm: Secondary | ICD-10-CM

## 2017-10-13 DIAGNOSIS — N6489 Other specified disorders of breast: Secondary | ICD-10-CM | POA: Diagnosis not present

## 2017-10-16 ENCOUNTER — Telehealth: Payer: Self-pay | Admitting: Emergency Medicine

## 2017-10-16 NOTE — Telephone Encounter (Signed)
-----   Message from Nunzio Cobbs, MD sent at 10/16/2017 10:35 AM EDT ----- I would make it a 2 week recheck if that works for the patient.   Thanks,   Brook  ----- Message ----- From: Michele Mcalpine, RN Sent: 10/16/2017  10:21 AM EDT To: Brook Oletta Lamas, MD  Dr. Quincy Simmonds, do you have a preference for time frame for recheck? Office visit was last on 10/05/17.

## 2017-10-16 NOTE — Telephone Encounter (Signed)
Spoke with patient. Agrees to appointment for recheck L Axillary area.  10/27/2017 office visit with Dr. Quincy Simmonds.  Call back prn.  Encounter closed.

## 2017-10-19 DIAGNOSIS — H11153 Pinguecula, bilateral: Secondary | ICD-10-CM | POA: Diagnosis not present

## 2017-10-19 DIAGNOSIS — H18413 Arcus senilis, bilateral: Secondary | ICD-10-CM | POA: Diagnosis not present

## 2017-10-19 DIAGNOSIS — H52223 Regular astigmatism, bilateral: Secondary | ICD-10-CM | POA: Diagnosis not present

## 2017-10-19 DIAGNOSIS — Z961 Presence of intraocular lens: Secondary | ICD-10-CM | POA: Diagnosis not present

## 2017-10-19 DIAGNOSIS — H353131 Nonexudative age-related macular degeneration, bilateral, early dry stage: Secondary | ICD-10-CM | POA: Diagnosis not present

## 2017-10-19 DIAGNOSIS — H5202 Hypermetropia, left eye: Secondary | ICD-10-CM | POA: Diagnosis not present

## 2017-10-19 DIAGNOSIS — H0100A Unspecified blepharitis right eye, upper and lower eyelids: Secondary | ICD-10-CM | POA: Diagnosis not present

## 2017-10-19 DIAGNOSIS — H26492 Other secondary cataract, left eye: Secondary | ICD-10-CM | POA: Diagnosis not present

## 2017-10-19 DIAGNOSIS — H0100B Unspecified blepharitis left eye, upper and lower eyelids: Secondary | ICD-10-CM | POA: Diagnosis not present

## 2017-10-26 NOTE — Progress Notes (Signed)
GYNECOLOGY  VISIT   HPI: 77 y.o.   Widowed  Caucasian  female   G1P1001 with Patient's last menstrual period was 01/31/2006 (approximate).   here for   Axilla recheck   Left axillary lump 4 -5 mm noted at last exam.  She had axillary soreness she thinks due to lifting boxes.  She has had a TRAM reconstruction following mastectomy for breast cancer.   [place] axilla negative.   Dx with macular degeneration due to age.   GYNECOLOGIC HISTORY: Patient's last menstrual period was 01/31/2006 (approximate). Contraception:  postmenopausal Menopausal hormone therapy:  None Last mammogram: Bilateral mastectomy done in 1966  Last pap smear:   12/08/16 normal         OB History    Gravida  1   Para  1   Term  1   Preterm  0   AB  0   Living  1     SAB  0   TAB  0   Ectopic  0   Multiple  0   Live Births  0              Patient Active Problem List   Diagnosis Date Noted  . Presence of pessary 12/08/2016  . Rectocele 09/19/2016  . 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 Memory changes 12/04/2015  . 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 Word finding difficulty 09/14/2015  . Lack of coordination 09/14/2015  . Urinary frequency 09/14/2015  . 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 Hyperglycemia 09/14/2015  . Vegetarian diet 09/14/2015    Past Medical History:  Diagnosis Date  . Anxiety   . Cancer [facility])    Breast/ bilateral mastectomy  . Macular degeneration (senile) of retina   . 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 Osteoporosis     Past Surgical History:  Procedure Laterality Date  . BREAST SURGERY    . FOOT SURGERY    . MASTECTOMY    . NASAL SEPTUM SURGERY    . OOPHORECTOMY     Bil.oophorectomy  . TONSILLECTOMY AND ADENOIDECTOMY    . TUBAL LIGATION      Current Outpatient Medications  Medication Sig Dispense Refill  . ADK  5000-5000-500 UNIT-MCG CAPS Take by mouth daily.    [name] b complex vitamins tablet Take 1 tablet by mouth daily.    . Calcium Carb-Cholecalciferol (CALCIUM 1000 + D PO) Take by mouth.    . Cholecalciferol (VITAMIN D-3 PO) Take 2,500 Units by mouth daily. FROM ORGANIC LICHEN    . Coenzyme Q10 (COQ10 PO) Take by mouth.    . Iodine Strong, Lugols, (IODINE STRONG PO) Take by mouth daily.    . Lactobacillus (ACIDOPHILUS PO) Take by mouth daily.    [name] MAGNESIUM CITRATE PO Take 1 tablet by mouth daily.    . Multiple Vitamins-Minerals (OSTEO COMPLEX PO) Take 2 tablets by mouth 2 (two) times daily.    . Nutritional Supplements (JUICE PLUS FIBRE PO) Take 2 tablets by mouth daily. ORCHARD AND OMEGA JUICE PLUS IN THE MORNING    . OVER THE COUNTER MEDICATION Take 5 tablets by mouth daily. MASTER, AMINO ACID 1,000    . progesterone (PROMETRIUM) 200 MG capsule Take 200 mg by mouth daily. Takes for osteoporosis    . Selenium 200 MCG CAPS Take by mouth daily. SELENIUM PLUS VITAMIN E    . triamcinolone (KENALOG) 0.025 % ointment Apply 1 application  topically 2 (two) times daily. 30 g 0   No current facility-administered medications for this visit.      ALLERGIES: Patient has no known allergies.  Family History  Adopted: Yes  Problem Relation Age of Onset  . Osteoporosis Mother   . Stroke Mother   . Colon cancer Neg Hx     Social History   Socioeconomic History  . Marital status: Widowed    Spouse name: Not on file  . Number of children: Not on file  . Years of education: Not on file  . Highest education level: Not on file  Occupational History  . Not on file  Social Needs  . Financial resource strain: Not hard at all  . Food insecurity:    Worry: Never true    Inability: Never true  . Transportation needs:    Medical: No    Non-medical: No  Tobacco Use  . Smoking status: Never Smoker  . Smokeless tobacco: Never Used  Substance and Sexual Activity  . Alcohol use: No  . Drug use: No  .  Sexual activity: Never    Birth control/protection: Post-menopausal, Surgical    Comment: Tubal ligation  Lifestyle  . Physical activity:    Days per week: 3 days    Minutes per session: 30 min  . Stress: Only a little  Relationships  . Social connections:    Talks on phone: More than three times a week    Gets together: More than three times a week    Attends religious service: More than 4 times per year    Active member of club or organization: Yes    Attends meetings of clubs or organizations: More than 4 times per year    Relationship status: Widowed  . Intimate partner violence:    Fear of current or ex partner: No    Emotionally abused: No    Physically abused: No    Forced sexual activity: No  Other Topics Concern  . Not on file  Social History Narrative  . Not on file    Review of Systems  Constitutional: Negative.   HENT: Negative.   Eyes: Negative.   Respiratory: Negative.   Cardiovascular: Negative.   Gastrointestinal: Negative.   Endocrine: Negative.   Genitourinary: Negative.   Musculoskeletal: Negative.   Skin: Negative.   Allergic/Immunologic: Negative.   Neurological: Negative.   Hematological: Negative.   Psychiatric/Behavioral: Negative.   All other systems reviewed and are negative.   PHYSICAL EXAMINATION:    BP 130/68   Pulse 70   Ht 5' 7.75" (1.721 m)   Wt 129 lb (58.5 kg)   LMP 01/31/2006 (Approximate)   BMI 19.76 kg/m     General appearance: alert, cooperative and appears stated age   Breasts:  Absent.  No masses detected.  No axillary adenopathy  Chaperone was present for exam.  ASSESSMENT  Absent breasts.  Status post tram reconstruction.  Reassuring chest and axillary exam.  PLAN  Reassurance given.  Follow up for annual recheck of prolapse and prn.   An After Visit Summary was printed and given to the patient.  __15____ minutes face to face time of which over 50% was spent in counseling.

## 2017-10-27 ENCOUNTER — Encounter: Payer: Self-pay | Admitting: Obstetrics and Gynecology

## 2017-10-27 ENCOUNTER — Ambulatory Visit (INDEPENDENT_AMBULATORY_CARE_PROVIDER_SITE_OTHER): Payer: Medicare HMO | Admitting: Obstetrics and Gynecology

## 2017-10-27 ENCOUNTER — Other Ambulatory Visit: Payer: Self-pay

## 2017-10-27 VITALS — BP 130/68 | HR 70 | Ht 67.75 in | Wt 129.0 lb

## 2017-10-27 DIAGNOSIS — M79622 Pain in left upper arm: Secondary | ICD-10-CM

## 2017-11-30 ENCOUNTER — Other Ambulatory Visit: Payer: Medicare HMO

## 2017-11-30 DIAGNOSIS — R350 Frequency of micturition: Secondary | ICD-10-CM

## 2017-11-30 DIAGNOSIS — Z13220 Encounter for screening for lipoid disorders: Secondary | ICD-10-CM

## 2017-11-30 DIAGNOSIS — E890 Postprocedural hypothyroidism: Secondary | ICD-10-CM

## 2017-11-30 DIAGNOSIS — R739 Hyperglycemia, unspecified: Secondary | ICD-10-CM | POA: Diagnosis not present

## 2017-12-01 LAB — CBC WITH DIFFERENTIAL/PLATELET
Basophils Absolute: 41 cells/uL (ref 0–200)
Basophils Relative: 0.7 %
Eosinophils Absolute: 513 cells/uL — ABNORMAL HIGH (ref 15–500)
Eosinophils Relative: 8.7 %
HCT: 37.4 % (ref 35.0–45.0)
Hemoglobin: 12.8 g/dL (ref 11.7–15.5)
Lymphs Abs: 1269 cells/uL (ref 850–3900)
MCH: 31.1 pg (ref 27.0–33.0)
MCHC: 34.2 g/dL (ref 32.0–36.0)
MCV: 90.8 fL (ref 80.0–100.0)
MPV: 11.4 fL (ref 7.5–12.5)
Monocytes Relative: 14.8 %
Neutro Abs: 3204 cells/uL (ref 1500–7800)
Neutrophils Relative %: 54.3 %
Platelets: 167 10*3/uL (ref 140–400)
RBC: 4.12 10*6/uL (ref 3.80–5.10)
RDW: 11.9 % (ref 11.0–15.0)
Total Lymphocyte: 21.5 %
WBC mixed population: 873 cells/uL (ref 200–950)
WBC: 5.9 10*3/uL (ref 3.8–10.8)

## 2017-12-01 LAB — COMPLETE METABOLIC PANEL WITH GFR
AG Ratio: 1.3 (calc) (ref 1.0–2.5)
ALT: 14 U/L (ref 6–29)
AST: 21 U/L (ref 10–35)
Albumin: 3.6 g/dL (ref 3.6–5.1)
Alkaline phosphatase (APISO): 81 U/L (ref 33–130)
BUN: 18 mg/dL (ref 7–25)
CO2: 30 mmol/L (ref 20–32)
Calcium: 8.8 mg/dL (ref 8.6–10.4)
Chloride: 101 mmol/L (ref 98–110)
Creat: 0.64 mg/dL (ref 0.60–0.93)
GFR, Est African American: 100 mL/min/{1.73_m2} (ref >=60)
GFR, Est Non African American: 86 mL/min/{1.73_m2} (ref >=60)
Globulin: 2.8 g/dL (calc) (ref 1.9–3.7)
Glucose, Bld: 76 mg/dL (ref 65–99)
Potassium: 3.7 mmol/L (ref 3.5–5.3)
Sodium: 138 mmol/L (ref 135–146)
Total Bilirubin: 0.6 mg/dL (ref 0.2–1.2)
Total Protein: 6.4 g/dL (ref 6.1–8.1)

## 2017-12-01 LAB — LIPID PANEL
Cholesterol: 169 mg/dL (ref <200)
HDL: 46 mg/dL — ABNORMAL LOW (ref >50)
LDL Cholesterol (Calc): 108 mg/dL (calc) — ABNORMAL HIGH
Non-HDL Cholesterol (Calc): 123 mg/dL (calc) (ref <130)
Total CHOL/HDL Ratio: 3.7 (calc) (ref <5.0)
Triglycerides: 61 mg/dL (ref <150)

## 2017-12-01 LAB — HEMOGLOBIN A1C
Hgb A1c MFr Bld: 5.7 % of total Hgb — ABNORMAL HIGH (ref <5.7)
Mean Plasma Glucose: 117 (calc)
eAG (mmol/L): 6.5 (calc)

## 2017-12-01 LAB — TSH: TSH: 5.06 mIU/L — ABNORMAL HIGH (ref 0.40–4.50)

## 2017-12-04 ENCOUNTER — Ambulatory Visit (INDEPENDENT_AMBULATORY_CARE_PROVIDER_SITE_OTHER): Payer: Medicare HMO | Admitting: Internal Medicine

## 2017-12-04 ENCOUNTER — Encounter: Payer: Self-pay | Admitting: Internal Medicine

## 2017-12-04 VITALS — BP 126/70 | HR 86 | Temp 98.0°F | Ht 68.0 in | Wt 131.0 lb

## 2017-12-04 DIAGNOSIS — Z0000 Encounter for general adult medical examination without abnormal findings: Secondary | ICD-10-CM | POA: Diagnosis not present

## 2017-12-04 DIAGNOSIS — E890 Postprocedural hypothyroidism: Secondary | ICD-10-CM

## 2017-12-04 DIAGNOSIS — R739 Hyperglycemia, unspecified: Secondary | ICD-10-CM | POA: Diagnosis not present

## 2017-12-04 DIAGNOSIS — G250 Essential tremor: Secondary | ICD-10-CM

## 2017-12-04 DIAGNOSIS — Z23 Encounter for immunization: Secondary | ICD-10-CM | POA: Diagnosis not present

## 2017-12-04 DIAGNOSIS — F422 Mixed obsessional thoughts and acts: Secondary | ICD-10-CM | POA: Diagnosis not present

## 2017-12-04 DIAGNOSIS — M810 Age-related osteoporosis without current pathological fracture: Secondary | ICD-10-CM | POA: Diagnosis not present

## 2017-12-04 MED ORDER — ZOSTER VAC RECOMB ADJUVANTED 50 MCG/0.5ML IM SUSR: 0.5000 mL | Freq: Once | INTRAMUSCULAR | 1 refills | Status: AC

## 2017-12-04 NOTE — Patient Instructions (Signed)
Please go get your shingles vaccine series at the pharmacy (they are 2-6 months apart).  I also recommend the flu shot.  Your morning phlegm is likely allergy-related.  Since you like to avoid medications, I would recommend just good hydration and gargling with salt water.

## 2017-12-04 NOTE — Progress Notes (Signed)
Provider:  Rexene Edison. Mariea Clonts, D.O., C.M.D. Location:   [place]  Place of Service:   clinic  Previous PCP: Gayland Curry, DO Patient Care Team: Gayland Curry, DO as PCP - General (Geriatric Medicine)  Extended Emergency Contact Information Primary Emergency Contact: Amash,Heidi Address: [address]          [place],  [address] [place]te Litter of [place] Phone: [phone] Mobile Phone: [phone] Relation: Daughter  Goals of Care: Advanced Directive information Advanced Directives 06/01/2017  Does Patient Have a Medical Advance Directive? No  Type of Advance Directive -  Copy of [place] in Chart? -  Would patient like information on creating a medical advance directive? Yes (MAU/Ambulatory/Procedural Areas - Information given)    Chief Complaint  Patient presents with  . Annual Exam    CPE    HPI: Patient is a 77 y.o. female seen today for an annual physical exam.  She has no cardiac diagnoses to check an ekg.  She was diagnosed now with adult macular degeneration.    Tremor:  Only notices it sometimes--her daughter will see it.    Had episode where her hands got icy hot and she started to shiver.  Had a milder episode once since.  Apparently it was warm at the time.    She was without water after they found mold in the house and she's had renovations done.  Only had water in the bathroom sink.   It's finally coming together now.  She also struggled to eat properly b/c of her kitchen being torn apart and may have had low sugars.    She is not taking any medication for OCD right now at her choice.  Her daughter reports she needs help with problems olving skills, getting overwhelmed easily and fear of doing things that might not turn out perfectly.  She makes things more complicated than they really are. She's unable to maintain organization that already established or to be organized to start with.  Discussed this all seems to have to do with  anxiety and OCD behaviors.    She had her bilateral mastectomies and reconstruction with  tram flap surgery and that has pulled her forward.  She had been to a chiropractor some time back and he said that was the reason she was leaning forward.    She has some morning phlegm and cough.  Has to clear her throat. It will come up.     Past Medical History:  Diagnosis Date  . Anxiety   . Cancer [facility])    Breast/ bilateral mastectomy  . Macular degeneration (senile) of retina   . 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 Osteoporosis    Past Surgical History:  Procedure Laterality Date  . BREAST SURGERY    . FOOT SURGERY    . MASTECTOMY    . NASAL SEPTUM SURGERY    . OOPHORECTOMY     Bil.oophorectomy  . TONSILLECTOMY AND ADENOIDECTOMY    . TUBAL LIGATION      reports that she has never smoked. She has never used smokeless tobacco. She reports that she does not drink alcohol or use drugs.  Functional Status Survey:   independent  Family History  Adopted: Yes  Problem Relation Age of Onset  . Osteoporosis Mother   . Stroke Mother   . Colon cancer Neg Hx     Health Maintenance  Topic Date Due  . TETANUS/TDAP  01/31/2022  . DEXA SCAN  Completed  . PNA vac Low Risk Adult  Completed    No Known Allergies  Outpatient Encounter Medications as of 12/04/2017  Medication Sig  . ADK 5000-5000-500 UNIT-MCG CAPS Take by mouth daily.  [name] b complex vitamins tablet Take 1 tablet by mouth daily.  . Calcium Carb-Cholecalciferol (CALCIUM 1000 + D PO) Take by mouth.  . Cholecalciferol (VITAMIN D-3 PO) Take 2,500 Units by mouth daily. FROM ORGANIC LICHEN  . Coenzyme Q10 (COQ10 PO) Take by mouth.  . Iodine Strong, Lugols, (IODINE STRONG PO) Take by mouth daily.  . Lactobacillus (ACIDOPHILUS PO) Take by mouth daily.  [name] MAGNESIUM CITRATE PO Take 1 tablet by mouth daily.  . Multiple Vitamins-Minerals (OSTEO COMPLEX PO) Take 2 tablets by mouth 2 (two) times daily.  .  Nutritional Supplements (JUICE PLUS FIBRE PO) Take 2 tablets by mouth daily. ORCHARD AND OMEGA JUICE PLUS IN THE MORNING  . OVER THE COUNTER MEDICATION Take 5 tablets by mouth daily. MASTER, AMINO ACID 1,000  . progesterone (PROMETRIUM) 200 MG capsule Take 200 mg by mouth daily. Takes for osteoporosis  . Selenium 200 MCG CAPS Take by mouth daily. SELENIUM PLUS VITAMIN E  . triamcinolone (KENALOG) 0.025 % ointment Apply 1 application topically 2 (two) times daily.  [name] UNABLE TO FIND Hemp extract as needed   No facility-administered encounter medications on file as of 12/04/2017.     Review of Systems  Constitutional: Negative for chills, fever and malaise/fatigue.       2 episodes of chills  HENT: Negative for congestion.   Eyes: Negative for blurred vision.  Respiratory: Positive for cough and sputum production. Negative for shortness of breath.        In the mornings  Cardiovascular: Negative for chest pain and leg swelling.  Gastrointestinal: Negative for abdominal pain.  Genitourinary: Negative for dysuria.       Has pessary managed by gyn  Musculoskeletal: Negative for falls and joint pain.  Skin: Negative for itching and rash.  Neurological: Positive for tremors. Negative for dizziness and loss of consciousness.  Psychiatric/Behavioral: Negative for depression and memory loss. The patient is nervous/anxious. The patient does not have insomnia.     Vitals:   12/04/17 1333  BP: 126/70  Pulse: 86  Temp: 98 F (36.7 C)  TempSrc: Oral  SpO2: 97%  Weight: 131 lb (59.4 kg)  Height: 5\' 8"  (1.727 m)   Body mass index is 19.92 kg/m. Physical Exam  Constitutional: She is oriented to person, place, and time. She appears well-developed and well-nourished. No distress.  Thin female  HENT:  Head: Normocephalic and atraumatic.  Right Ear: External ear normal.  Left Ear: External ear normal.  Nose: Nose normal.  Mouth/Throat: Oropharynx is clear and moist. No oropharyngeal exudate.    Eyes: Pupils are equal, round, and reactive to light. Conjunctivae and EOM are normal.  Neck: Normal range of motion. Neck supple. No JVD present.  Cardiovascular: Normal rate, regular rhythm, normal heart sounds and intact distal pulses.  Pulmonary/Chest: Effort normal and breath sounds normal. No respiratory distress.  Abdominal: Soft. Bowel sounds are normal. She exhibits no distension. There is no tenderness.  Musculoskeletal: Normal range of motion. She exhibits no edema, tenderness or deformity.  Lymphadenopathy:    She has no cervical adenopathy.  Neurological: She is alert and oriented to person, place, and time. No cranial nerve deficit. Coordination normal.  Skin: Skin is warm and dry. Capillary refill takes less than 2 seconds.  Psychiatric: She has  a normal mood and affect. Her behavior is normal. Judgment and thought content normal.    Labs reviewed: Basic Metabolic Panel: Recent Labs    11/30/17 0903  NA 138  K 3.7  CL 101  CO2 30  GLUCOSE 76  BUN 18  CREATININE 0.64  CALCIUM 8.8   Liver Function Tests: Recent Labs    11/30/17 0903  AST 21  ALT 14  BILITOT 0.6  PROT 6.4   No results for input(s): LIPASE, AMYLASE in the last 8760 hours. No results for input(s): AMMONIA in the last 8760 hours. CBC: Recent Labs    11/30/17 0903  WBC 5.9  NEUTROABS 3,204  HGB 12.8  HCT 37.4  MCV 90.8  PLT 167   Cardiac Enzymes: No results for input(s): CKTOTAL, CKMB, CKMBINDEX, TROPONINI in the last 8760 hours. BNP: Invalid input(s): POCBNP Lab Results  Component Value Date   HGBA1C 5.7 (H) 11/30/2017   Lab Results  Component Value Date   TSH 5.06 (H) 11/30/2017   Lab Results  Component Value Date   VITAMINB12 597 09/14/2015   Assessment/Plan 1. Annual physical exam -performed today -pt sees gyn for gynecologic examination -refuses flu shot -agrees to shingrix at the pharmacy--Rx printed b/c she was not sure which one she would go to ([facility] does not  give)  2. Mixed obsessional thoughts and acts -ongoing and won't take FDA approved meds for this, daughter's concerns above all suggestive of this known diagnosis (OCD)  3. Senile osteoporosis -on progesterone thru gyn, vitamin D and another vitamin with vitamin D, calcium, magnesium, but not on true osteoporosis meds--has refused  4. Postablative hypothyroidism -not on thyroid meds either--taking iodine drops but can't say from whom, TSH mildly elevated  5. Hyperglycemia -notably higher with recent changes in diet with kitchen being renovated after mold issues, counseled on this as at risk for diabetes even though she has thin body habitus  6. Essential tremor -notable, evaluated by me before--no signs of parkinsonism, seems to be essential, not bothersome to patient the majority of the time so no need to intervene  7. Need for shingles vaccine - Zoster Vaccine Adjuvanted [facility]) injection; Inject 0.5 mLs into the muscle once for 1 dose.  Dispense: 0.5 mL; Refill: 1  Labs/tests ordered:  No new  6 mos med mgt  Denna Fryberger L. Wandra Babin, D.O. [facility] [address], [place] [address] Cell Phone (Mon-Fri 8am-5pm):  [phone] On Call:  [phone] & follow prompts after 5pm & weekends Office Phone:  [phone] Office Fax:  [phone]

## 2017-12-07 DIAGNOSIS — N951 Menopausal and female climacteric states: Secondary | ICD-10-CM | POA: Diagnosis not present

## 2017-12-07 DIAGNOSIS — R5383 Other fatigue: Secondary | ICD-10-CM | POA: Diagnosis not present

## 2017-12-07 DIAGNOSIS — R6889 Other general symptoms and signs: Secondary | ICD-10-CM | POA: Diagnosis not present

## 2017-12-12 DIAGNOSIS — R4586 Emotional lability: Secondary | ICD-10-CM | POA: Diagnosis not present

## 2017-12-12 DIAGNOSIS — R5383 Other fatigue: Secondary | ICD-10-CM | POA: Diagnosis not present

## 2017-12-12 DIAGNOSIS — N951 Menopausal and female climacteric states: Secondary | ICD-10-CM | POA: Diagnosis not present

## 2017-12-12 DIAGNOSIS — R6889 Other general symptoms and signs: Secondary | ICD-10-CM | POA: Diagnosis not present

## 2018-01-12 ENCOUNTER — Telehealth: Payer: Self-pay

## 2018-01-12 DIAGNOSIS — N951 Menopausal and female climacteric states: Secondary | ICD-10-CM | POA: Diagnosis not present

## 2018-01-12 DIAGNOSIS — R232 Flushing: Secondary | ICD-10-CM | POA: Diagnosis not present

## 2018-01-12 DIAGNOSIS — R4586 Emotional lability: Secondary | ICD-10-CM | POA: Diagnosis not present

## 2018-01-12 DIAGNOSIS — R5383 Other fatigue: Secondary | ICD-10-CM | POA: Diagnosis not present

## 2018-01-12 NOTE — Telephone Encounter (Signed)
Patient called sating she was informed by another provider to have her Thyroid and Iron levels due to patient staying cold and bruising easily.   I explained to patient she had her thyroid checked in October of this year and that was addressed at the office visit and that it would be monitored and checked again at next office visit. Patient is asking if she can have lab work to check her iron levels.

## 2018-01-12 NOTE — Telephone Encounter (Signed)
Call placed to patient she is aware of provider recommendations

## 2018-01-12 NOTE — Telephone Encounter (Signed)
She is not anemic and has normal sized red blood cells on her labs so I doubt she has iron deficiency.  The test is not necessary.  She's probably cold due to minimal body fat, small stature and bruises easily due to thinning skin which comes with age.

## 2018-01-26 ENCOUNTER — Ambulatory Visit (INDEPENDENT_AMBULATORY_CARE_PROVIDER_SITE_OTHER): Payer: Medicare HMO

## 2018-01-26 ENCOUNTER — Telehealth: Payer: Self-pay

## 2018-01-26 VITALS — BP 118/58 | HR 67 | Temp 97.9°F | Ht 68.0 in | Wt 134.0 lb

## 2018-01-26 DIAGNOSIS — Z0000 Encounter for general adult medical examination without abnormal findings: Secondary | ICD-10-CM

## 2018-01-26 NOTE — Progress Notes (Signed)
Subjective:   Yolanda Robbins is a 77 y.o. female who presents for Medicare Annual (Subsequent) preventive examination.  Last AWV-01/20/2017    Objective:     Vitals: BP (!) 118/58 (BP Location: Left Arm, Patient Position: Sitting)   Pulse 67   Temp 97.9 F (36.6 C) (Oral)   Ht 5\' 8"  (1.727 m)   Wt 134 lb (60.8 kg)   LMP 01/31/2006 (Approximate)   SpO2 98%   BMI 20.37 kg/m   Body mass index is 20.37 kg/m.  Advanced Directives 01/26/2018 06/01/2017 01/20/2017 12/08/2016 07/07/2016 07/04/2016 03/07/2016  Does Patient Have a Medical Advance Directive? No No No No No No No  Type of Advance Directive - - - - - - -  Copy of Healthcare Power of Attorney in Chart? - - - - - - -  Would patient like information on creating a medical advance directive? Yes (MAU/Ambulatory/Procedural Areas - Information given) Yes (MAU/Ambulatory/Procedural Areas - Information given) Yes (ED - Information included in AVS) No - Patient declined Yes (MAU/Ambulatory/Procedural Areas - Information given) No - Patient declined -    Tobacco Social History   Tobacco Use  Smoking Status Never Smoker  Smokeless Tobacco Never Used     Counseling given: Not Answered   Clinical Intake:  Pre-visit preparation completed: No  Pain : No/denies pain     Nutritional Risks: None Diabetes: No  How often do you need to have someone help you when you read instructions, pamphlets, or other written materials from your doctor or pharmacy?: 1 - Never What is the last grade level you completed in school?: 2 years college  Interpreter Needed?: No  Information entered by :: Tyson Dense, RN  Past Medical History:  Diagnosis Date  . Anxiety   . Cancer [facility])    Breast/ bilateral mastectomy  . Macular degeneration (senile) of retina   . 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 Osteoporosis    Past Surgical History:  Procedure Laterality Date  . BREAST SURGERY    . FOOT SURGERY    . MASTECTOMY     . NASAL SEPTUM SURGERY    . OOPHORECTOMY     Bil.oophorectomy  . TONSILLECTOMY AND ADENOIDECTOMY    . TUBAL LIGATION     Family History  Adopted: Yes  Problem Relation Age of Onset  . Osteoporosis Mother   . Stroke Mother   . Colon cancer Neg Hx    Social History   Socioeconomic History  . Marital status: Widowed    Spouse name: Not on file  . Number of children: Not on file  . Years of education: Not on file  . Highest education level: Not on file  Occupational History  . Not on file  Social Needs  . Financial resource strain: Not hard at all  . Food insecurity:    Worry: Never true    Inability: Never true  . Transportation needs:    Medical: No    Non-medical: No  Tobacco Use  . Smoking status: Never Smoker  . Smokeless tobacco: Never Used  Substance and Sexual Activity  . Alcohol use: No  . Drug use: No  . Sexual activity: Never    Birth control/protection: Post-menopausal, Surgical    Comment: Tubal ligation  Lifestyle  . Physical activity:    Days per week: 3 days    Minutes per session: 30 min  . Stress: Only a little  Relationships  . Social connections:    Talks on  phone: More than three times a week    Gets together: More than three times a week    Attends religious service: More than 4 times per year    Active member of club or organization: Yes    Attends meetings of clubs or organizations: More than 4 times per year    Relationship status: Widowed  Other Topics Concern  . Not on file  Social History Narrative  . Not on file    Outpatient Encounter Medications as of 01/26/2018  Medication Sig  . ADK 5000-5000-500 UNIT-MCG CAPS Take by mouth daily.  . Ascorbic Acid (VITAMIN C) 1000 MG tablet Take 1,000 mg by mouth daily.  . ASTAXANTHIN PO Take 2 capsules by mouth.  [name] b complex vitamins tablet Take 1 tablet by mouth daily.  . Calcium Carb-Cholecalciferol (CALCIUM 1000 + D PO) Take by mouth.  . Coenzyme Q10 (COQ10 PO) Take by mouth.  .  Iodine Strong, Lugols, (IODINE STRONG PO) Take by mouth daily.  . Nutritional Supplements (JUICE PLUS FIBRE PO) Take 2 tablets by mouth daily. ORCHARD AND OMEGA JUICE PLUS IN THE MORNING  . OVER THE COUNTER MEDICATION Take 5 tablets by mouth daily. MASTER, AMINO ACID 1,000  . progesterone (PROMETRIUM) 100 MG capsule Take 100 mg by mouth daily.  . Taurine 500 MG CAPS Take 500 mg by mouth.  [name] UNABLE TO FIND Hemp extract as needed  . Cholecalciferol (VITAMIN D-3 PO) Take 2,500 Units by mouth daily. FROM ORGANIC LICHEN  . [DISCONTINUED] Lactobacillus (ACIDOPHILUS PO) Take by mouth daily.  . [DISCONTINUED] MAGNESIUM CITRATE PO Take 1 tablet by mouth daily.  . [DISCONTINUED] Multiple Vitamins-Minerals (OSTEO COMPLEX PO) Take 2 tablets by mouth 2 (two) times daily.  . [DISCONTINUED] progesterone (PROMETRIUM) 200 MG capsule Take 200 mg by mouth daily. Takes for osteoporosis  . [DISCONTINUED] Selenium 200 MCG CAPS Take by mouth daily. SELENIUM PLUS VITAMIN E  . [DISCONTINUED] triamcinolone (KENALOG) 0.025 % ointment Apply 1 application topically 2 (two) times daily.   No facility-administered encounter medications on file as of 01/26/2018.     Activities of Daily Living In your present state of health, do you have any difficulty performing the following activities: 01/26/2018  Hearing? N  Vision? N  Difficulty concentrating or making decisions? N  Walking or climbing stairs? N  Dressing or bathing? N  Doing errands, shopping? N  Preparing Food and eating ? N  Using the Toilet? N  In the past six months, have you accidently leaked urine? Y  Comment rarely when it's urgent  Do you have problems with loss of bowel control? N  Managing your Medications? N  Managing your Finances? N  Housekeeping or managing your Housekeeping? N  Some recent data might be hidden    Patient Care Team: Gayland Curry, DO as PCP - General (Geriatric Medicine)    Assessment:   This is a routine wellness  examination for [facility].  Exercise Activities and Dietary recommendations Current Exercise Habits: Home exercise routine, Type of exercise: treadmill;walking;Other - see comments(water aerobics), Time (Minutes): 30, Frequency (Times/Week): 3, Weekly Exercise (Minutes/Week): 90, Intensity: Mild, Exercise limited by: None identified  Goals    . <enter goal here>     Starting 01/18/16, I will maintain my current exercise routine and I will attempt to work on my posture.        Fall Risk Fall Risk  01/26/2018 12/04/2017 06/01/2017 03/09/2017 01/20/2017  Falls in the past year? 1 1 No No Yes  Number falls in past yr: 1 0 - - 2 or more  Comment - - - - -  Injury with Fall? 0 0 - - No  Follow up - - - - -   Is the patient's home free of loose throw rugs in walkways, pet beds, electrical cords, etc?   yes      Grab bars in the bathroom? no      Handrails on the stairs?   yes      Adequate lighting?   yes  Timed Get Up and Go performed: 10 seconds  Depression Screen PHQ 2/9 Scores 01/26/2018 12/04/2017 06/01/2017 03/09/2017  PHQ - 2 Score 0 0 0 0     Cognitive Function MMSE - Mini Mental State Exam 01/26/2018 01/20/2017 01/18/2016 09/21/2015  Not completed: Refused - (No Data) -  Orientation to time 5 5 - 5  Orientation to Place 5 5 - 4  Registration 3 3 - 3  Attention/ Calculation 5 5 - 5  Recall 2 2 - 2  Language- name 2 objects 2 2 - 2  Language- repeat 1 1 - 1  Language- follow 3 step command 3 3 - 2  Language- read & follow direction 1 1 - 1  Write a sentence 1 1 - 1  Copy design 0 1 - 1  Total score 28 29 - 27   Montreal Cognitive Assessment  12/04/2015  Visuospatial/ Executive (0/5) 4  Naming (0/3) 3  Attention: Read list of digits (0/2) 2  Attention: Read list of letters (0/1) 1  Attention: Serial 7 subtraction starting at 100 (0/3) 3  Language: Repeat phrase (0/2) 2  Language : Fluency (0/1) 1  Abstraction (0/2) 2  Delayed Recall (0/5) 4  Orientation (0/6) 6  Total 28       Immunization History  Administered Date(s) Administered  . Pneumococcal Conjugate-13 01/31/2013  . Pneumococcal Polysaccharide-23 02/01/2007  . Tdap 02/01/2012  . Zoster Recombinat (Shingrix) 01/25/2018    Qualifies for Shingles Vaccine? Got 1st shot yesterday. Will get seconds one in 2-6 months  Screening Tests Health Maintenance  Topic Date Due  . TETANUS/TDAP  01/31/2022  . DEXA SCAN  Completed  . PNA vac Low Risk Adult  Completed    Cancer Screenings: Lung: Low Dose CT Chest recommended if Age 54-80 years, 30 pack-year currently smoking OR have quit w/in 15years. Patient does not qualify. Breast:  Up to date on Mammogram? Yes   Up to date of Bone Density/Dexa? Yes Colorectal: up to date  Additional Screenings:  Hepatitis C Screening: declined     Plan:    I have personally reviewed and addressed the Medicare Annual Wellness questionnaire and have noted the following in the patient's chart:  A. Medical and social history B. Use of alcohol, tobacco or illicit drugs  C. Current medications and supplements D. Functional ability and status E.  Nutritional status F.  Physical activity G. Advance directives H. List of other physicians I.  Hospitalizations, surgeries, and ER visits in previous 12 months J.  [place] to include hearing, vision, cognitive, depression L. Referrals and appointments - none  In addition, I have reviewed and discussed with patient certain preventive protocols, quality metrics, and best practice recommendations. A written personalized care plan for preventive services as well as general preventive health recommendations were provided to patient.  See attached scanned questionnaire for additional information.   Signed,   Tyson Dense, RN Nurse Health Advisor  Patient concerns: Martin Majestic to  Lyn Henri MD to have progesterone checked. That doctor requested to have her iodine checked.

## 2018-01-26 NOTE — Patient Instructions (Addendum)
[name] , Thank you for taking time to come for your Medicare Wellness Visit. I appreciate your ongoing commitment to your health goals. Please review the following plan we discussed and let me know if I can assist you in the future.   Screening recommendations/referrals: Colonoscopy excluded, over age 77 Mammogram excluded, over age 67 Bone Density up to date Recommended yearly ophthalmology/optometry visit for glaucoma screening and checkup Recommended yearly dental visit for hygiene and checkup  Vaccinations: Influenza vaccine excluded Pneumococcal vaccine up to date, completed Tdap vaccine up to date, due 01/31/2022 Shingles vaccine up to date, please get second shot in 2-6 months  Advanced directives: Advance directive discussed with you today. I have provided a copy for you to complete at home and have notarized. Once this is complete please bring a copy in to our office so we can scan it into your chart.  Conditions/risks identified: none  Next appointment: Medicare Wellness Visit 01/28/2019 @ 1pm   Preventive Care 28 Years and Older, Female Preventive care refers to lifestyle choices and visits with your health care provider that can promote health and wellness. What does preventive care include?  A yearly physical exam. This is also called an annual well check.  Dental exams once or twice a year.  Routine eye exams. Ask your health care provider how often you should have your eyes checked.  Personal lifestyle choices, including:  Daily care of your teeth and gums.  Regular physical activity.  Eating a healthy diet.  Avoiding tobacco and drug use.  Limiting alcohol use.  Practicing safe sex.  Taking low-dose aspirin every day.  Taking vitamin and mineral supplements as recommended by your health care provider. What happens during an annual well check? The services and screenings done by your health care provider during your annual well check will depend  on your age, overall health, lifestyle risk factors, and family history of disease. Counseling  Your health care provider may ask you questions about your:  Alcohol use.  Tobacco use.  Drug use.  Emotional well-being.  Home and relationship well-being.  Sexual activity.  Eating habits.  History of falls.  Memory and ability to understand (cognition).  Work and work Statistician.  Reproductive health. Screening  You may have the following tests or measurements:  Height, weight, and BMI.  Blood pressure.  Lipid and cholesterol levels. These may be checked every 5 years, or more frequently if you are over 2 years old.  Skin check.  Lung cancer screening. You may have this screening every year starting at age 31 if you have a 30-pack-year history of smoking and currently smoke or have quit within the past 15 years.  Fecal occult blood test (FOBT) of the stool. You may have this test every year starting at age 2.  Flexible sigmoidoscopy or colonoscopy. You may have a sigmoidoscopy every 5 years or a colonoscopy every 10 years starting at age 106.  Hepatitis C blood test.  Hepatitis B blood test.  Sexually transmitted disease (STD) testing.  Diabetes screening. This is done by checking your blood sugar (glucose) after you have not eaten for a while (fasting). You may have this done every 1-3 years.  Bone density scan. This is done to screen for osteoporosis. You may have this done starting at age 26.  Mammogram. This may be done every 1-2 years. Talk to your health care provider about how often you should have regular mammograms. Talk with your health care provider about your test  results, treatment options, and if necessary, the need for more tests. Vaccines  Your health care provider may recommend certain vaccines, such as:  Influenza vaccine. This is recommended every year.  Tetanus, diphtheria, and acellular pertussis (Tdap, Td) vaccine. You may need a Td  booster every 10 years.  Zoster vaccine. You may need this after age 72.  Pneumococcal 13-valent conjugate (PCV13) vaccine. One dose is recommended after age 61.  Pneumococcal polysaccharide (PPSV23) vaccine. One dose is recommended after age 22. Talk to your health care provider about which screenings and vaccines you need and how often you need them. This information is not intended to replace advice given to you by your health care provider. Make sure you discuss any questions you have with your health care provider. Document Released: 02/13/2015 Document Revised: 10/07/2015 Document Reviewed: 11/18/2014 Elsevier Interactive Patient Education  2017 [place] Prevention in the Home Falls can cause injuries. They can happen to people of all ages. There are many things you can do to make your home safe and to help prevent falls. What can I do on the outside of my home?  Regularly fix the edges of walkways and driveways and fix any cracks.  Remove anything that might make you trip as you walk through a door, such as a raised step or threshold.  Trim any bushes or trees on the path to your home.  Use bright outdoor lighting.  Clear any walking paths of anything that might make someone trip, such as rocks or tools.  Regularly check to see if handrails are loose or broken. Make sure that both sides of any steps have handrails.  Any raised decks and porches should have guardrails on the edges.  Have any leaves, snow, or ice cleared regularly.  Use sand or salt on walking paths during winter.  Clean up any spills in your garage right away. This includes oil or grease spills. What can I do in the bathroom?  Use night lights.  Install grab bars by the toilet and in the tub and shower. Do not use towel bars as grab bars.  Use non-skid mats or decals in the tub or shower.  If you need to sit down in the shower, use a plastic, non-slip stool.  Keep the floor dry. Clean up  any water that spills on the floor as soon as it happens.  Remove soap buildup in the tub or shower regularly.  Attach bath mats securely with double-sided non-slip rug tape.  Do not have throw rugs and other things on the floor that can make you trip. What can I do in the bedroom?  Use night lights.  Make sure that you have a light by your bed that is easy to reach.  Do not use any sheets or blankets that are too big for your bed. They should not hang down onto the floor.  Have a firm chair that has side arms. You can use this for support while you get dressed.  Do not have throw rugs and other things on the floor that can make you trip. What can I do in the kitchen?  Clean up any spills right away.  Avoid walking on wet floors.  Keep items that you use a lot in easy-to-reach places.  If you need to reach something above you, use a strong step stool that has a grab bar.  Keep electrical cords out of the way.  Do not use floor polish or wax that makes  floors slippery. If you must use wax, use non-skid floor wax.  Do not have throw rugs and other things on the floor that can make you trip. What can I do with my stairs?  Do not leave any items on the stairs.  Make sure that there are handrails on both sides of the stairs and use them. Fix handrails that are broken or loose. Make sure that handrails are as long as the stairways.  Check any carpeting to make sure that it is firmly attached to the stairs. Fix any carpet that is loose or worn.  Avoid having throw rugs at the top or bottom of the stairs. If you do have throw rugs, attach them to the floor with carpet tape.  Make sure that you have a light switch at the top of the stairs and the bottom of the stairs. If you do not have them, ask someone to add them for you. What else can I do to help prevent falls?  Wear shoes that:  Do not have high heels.  Have rubber bottoms.  Are comfortable and fit you well.  Are  closed at the toe. Do not wear sandals.  If you use a stepladder:  Make sure that it is fully opened. Do not climb a closed stepladder.  Make sure that both sides of the stepladder are locked into place.  Ask someone to hold it for you, if possible.  Clearly mark and make sure that you can see:  Any grab bars or handrails.  First and last steps.  Where the edge of each step is.  Use tools that help you move around (mobility aids) if they are needed. These include:  Canes.  Walkers.  Scooters.  Crutches.  Turn on the lights when you go into a dark area. Replace any light bulbs as soon as they burn out.  Set up your furniture so you have a clear path. Avoid moving your furniture around.  If any of your floors are uneven, fix them.  If there are any pets around you, be aware of where they are.  Review your medicines with your doctor. Some medicines can make you feel dizzy. This can increase your chance of falling. Ask your doctor what other things that you can do to help prevent falls. This information is not intended to replace advice given to you by your health care provider. Make sure you discuss any questions you have with your health care provider. Document Released: 11/13/2008 Document Revised: 06/25/2015 Document Reviewed: 02/21/2014 Elsevier Interactive Patient Education  2017 [employer].

## 2018-01-26 NOTE — Telephone Encounter (Signed)
    Saw patient today for AWV. She went to [facility] weight loss and hormone replacement therapy for her progesterone levels. While there they asked her to get her iodine levels checked somewhere. I had lab look it up and told her it would be about $46. You just saw her in November but don't have any upcoming appointment scheduled. Please advise if you would be able to order this if she agrees (she said she will get back to [place]), or if you need to see her in the office first

## 2018-01-28 NOTE — Telephone Encounter (Signed)
Really I think they should order it if they want her to have it tested.  She was supposed to schedule her next appt when she left (whatever my note read).

## 2018-01-29 NOTE — Telephone Encounter (Signed)
Patient is going to check to see if [facility] offered the iodine test. I scheduled her for 06/04/2018 for follow up per your note

## 2018-02-26 DIAGNOSIS — H353131 Nonexudative age-related macular degeneration, bilateral, early dry stage: Secondary | ICD-10-CM | POA: Diagnosis not present

## 2018-03-27 DIAGNOSIS — H26491 Other secondary cataract, right eye: Secondary | ICD-10-CM | POA: Diagnosis not present

## 2018-04-11 DIAGNOSIS — H26492 Other secondary cataract, left eye: Secondary | ICD-10-CM | POA: Diagnosis not present

## 2018-04-11 DIAGNOSIS — Z961 Presence of intraocular lens: Secondary | ICD-10-CM | POA: Diagnosis not present

## 2018-04-17 DIAGNOSIS — R232 Flushing: Secondary | ICD-10-CM | POA: Diagnosis not present

## 2018-04-17 DIAGNOSIS — R5383 Other fatigue: Secondary | ICD-10-CM | POA: Diagnosis not present

## 2018-04-17 DIAGNOSIS — R6889 Other general symptoms and signs: Secondary | ICD-10-CM | POA: Diagnosis not present

## 2018-06-04 ENCOUNTER — Ambulatory Visit (INDEPENDENT_AMBULATORY_CARE_PROVIDER_SITE_OTHER): Payer: Medicare HMO | Admitting: Internal Medicine

## 2018-06-04 ENCOUNTER — Encounter: Payer: Self-pay | Admitting: Internal Medicine

## 2018-06-04 ENCOUNTER — Other Ambulatory Visit: Payer: Self-pay

## 2018-06-04 DIAGNOSIS — E890 Postprocedural hypothyroidism: Secondary | ICD-10-CM | POA: Diagnosis not present

## 2018-06-04 DIAGNOSIS — N816 Rectocele: Secondary | ICD-10-CM

## 2018-06-04 DIAGNOSIS — F422 Mixed obsessional thoughts and acts: Secondary | ICD-10-CM | POA: Diagnosis not present

## 2018-06-04 DIAGNOSIS — M810 Age-related osteoporosis without current pathological fracture: Secondary | ICD-10-CM

## 2018-06-04 DIAGNOSIS — N3941 Urge incontinence: Secondary | ICD-10-CM | POA: Diagnosis not present

## 2018-06-04 DIAGNOSIS — Z960 Presence of urogenital implants: Secondary | ICD-10-CM | POA: Diagnosis not present

## 2018-06-04 NOTE — Patient Instructions (Addendum)
Please arrange to get your 2nd shingrix done by the end of June.    Come get your TSH next month to recheck your thyroid and make sure you do not need to start on thyroid hormone supplements.

## 2018-06-04 NOTE — Progress Notes (Signed)
This service is provided via telemedicine  No vital signs collected/recorded due to the encounter was a telemedicine visit.   Location of patient (ex: home, work):  Home   Patient consents to a telephone visit:  Yes  Location of the provider (ex: office, home):  Office   Names of all persons participating in the telemedicine service and their role in the encounter:  Ruthell Rummage CMA, Dr. Hollace Kinnier, Karoline Caldwell  Time spent on call:  Ruthell Rummage CMA, 6 spent  Minutes on phone with patient.      Provider:  Rexene Edison. Mariea Clonts, D.O., C.M.D.  Goals of Care:  Advanced Directives 01/26/2018  Does Patient Have a Medical Advance Directive? No  Type of Advance Directive -  Copy of [place] in Chart? -  Would patient like information on creating a medical advance directive? Yes (MAU/Ambulatory/Procedural Areas - Information given)     Chief Complaint  Patient presents with  . Medical Management of Chronic Issues    6 month follow up on medication management     HPI: Patient is a 78 y.o. female spoken with by phone for medical management of chronic diseases.    She is being well-contained by her daughter.  They do go walking.    She kind of panicked the other day b/c she has had a finger turn white at times.  Had an episode the other morning.  She was cold overnight the other night.  She turned the heat up a little and has been ok now.    Memory seems stable on memory testing based on 6 months ago.  She admits her memory is not 100% when she is stressed out but she seems to do ok overall.  She had a little melt down the other day when she got real antsy, but she got over it.  She was trying to get stuff done Ishmael Holter has her on a schedule and she feels like she's the child and her daughter is the mother).  She had to use a little cbd oil to calm her down.    Bladder control:  Went to a class about this.  Missed the last two classes.  She had been able to go  without a pad.  Just uses a pantiliner in case.  Rarely has an urgency problem.    First shingrix was 01/25/18.  When she last went into [facility], they were no longer giving them at that time.    Past Medical History:  Diagnosis Date  . Anxiety   . Cancer [facility])    Breast/ bilateral mastectomy  . Macular degeneration (senile) of retina   . 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 Osteoporosis     Past Surgical History:  Procedure Laterality Date  . BREAST SURGERY    . FOOT SURGERY    . MASTECTOMY    . NASAL SEPTUM SURGERY    . OOPHORECTOMY     Bil.oophorectomy  . TONSILLECTOMY AND ADENOIDECTOMY    . TUBAL LIGATION      No Known Allergies  Outpatient Encounter Medications as of 06/04/2018  Medication Sig  . ADK 5000-5000-500 UNIT-MCG CAPS Take by mouth daily.  . Ascorbic Acid (VITAMIN C) 1000 MG tablet Take 1,000 mg by mouth daily.  . ASTAXANTHIN PO Take 1 capsule by mouth.   [name] b complex vitamins tablet Take 1 tablet by mouth daily.  . Boron 3 MG CAPS Take 1 capsule by mouth daily.  . Calcium  Carb-Cholecalciferol (CALCIUM 1000 + D PO) Take by mouth.  . Cholecalciferol (VITAMIN D-3 PO) Take 2,500 Units by mouth daily. FROM ORGANIC LICHEN  . Iodine Strong, [place], (IODINE STRONG PO) Take by mouth daily.  . Nutritional Supplements (JUICE PLUS FIBRE PO) Take 2 tablets by mouth daily. ORCHARD AND OMEGA JUICE PLUS IN THE MORNING, also taking 2 veggies in the evening  . OVER THE COUNTER MEDICATION Take 5 tablets by mouth daily. MASTER, AMINO ACID 1,000  . progesterone (PROMETRIUM) 100 MG capsule Take 100 mg by mouth daily.  . Taurine 500 MG CAPS Take 500 mg by mouth.  . [DISCONTINUED] Coenzyme Q10 (COQ10 PO) Take by mouth.  . [DISCONTINUED] UNABLE TO FIND Hemp extract as needed   No facility-administered encounter medications on file as of 06/04/2018.     Review of Systems:  Review of Systems  Constitutional: Negative for chills, fever and malaise/fatigue.   HENT: Negative for congestion and hearing loss.   Eyes: Negative for blurred vision.  Respiratory: Negative for cough and shortness of breath.   Cardiovascular: Negative for chest pain, palpitations and leg swelling.  Gastrointestinal: Negative for abdominal pain, blood in stool, constipation, diarrhea and melena.  Genitourinary: Positive for urgency. Negative for dysuria, flank pain, frequency and hematuria.  Musculoskeletal: Negative for falls and joint pain.  Skin: Negative for itching and rash.  Neurological: Positive for tremors. Negative for dizziness, sensory change and loss of consciousness.  Endo/Heme/Allergies: Does not bruise/bleed easily.  Psychiatric/Behavioral: Negative for depression and memory loss. The patient is nervous/anxious. The patient does not have insomnia.        OCD    Health Maintenance  Topic Date Due  . TETANUS/TDAP  01/31/2022  . DEXA SCAN  Completed  . PNA vac Low Risk Adult  Completed    Physical Exam: Could not be performed as visit non face-to-face via phone   Labs reviewed: Basic Metabolic Panel: Recent Labs    11/30/17 0903  NA 138  K 3.7  CL 101  CO2 30  GLUCOSE 76  BUN 18  CREATININE 0.64  CALCIUM 8.8  TSH 5.06*   Liver Function Tests: Recent Labs    11/30/17 0903  AST 21  ALT 14  BILITOT 0.6  PROT 6.4   No results for input(s): LIPASE, AMYLASE in the last 8760 hours. No results for input(s): AMMONIA in the last 8760 hours. CBC: Recent Labs    11/30/17 0903  WBC 5.9  NEUTROABS 3,204  HGB 12.8  HCT 37.4  MCV 90.8  PLT 167   Lipid Panel: Recent Labs    11/30/17 0903  CHOL 169  HDL 46*  LDLCALC 108*  TRIG 61  CHOLHDL 3.7   Lab Results  Component Value Date   HGBA1C 5.7 (H) 11/30/2017    Procedures since last visit: No results found.  Assessment/Plan 1. Postablative hypothyroidism - is on iodine for this thru integrative medicine -last tsh was slightly elevated -we will reassess this next month to  ensure that it has not trended up further after her ablation--not sure if she'll be willing to take levothyroxine if it does, but that's what I would recommend if it's over 10  2. Senile osteoporosis -cont ca with D and additional D3 supplement plus walking and other weightbearing exercises she does -has not been willing to take fosamax, prolia or evenity  3. Mixed obsessional thoughts and acts -doing ok here it seems--admits to a few anxious spells but had been on zoloft and elected to  stop it   4. Rectocele -doing fine with pessary use  5. Presence of pessary -has helped with incontinence and she's doing regular exercises to help with urgency  6. Urge incontinence -as in #5, using pantiliner only now and doing better overall here with regular kegels and other exercises she was taught to prevent incontinence  Labs/tests ordered:   Orders Placed This Encounter  Procedures  . TSH    Standing Status:   Future    Standing Expiration Date:   06/04/2019    Next appt:  6 mos for med mgt; labs same day  Non face-to-face time spent on televisit:  25 mins  Edvin Albus L. Soniya Ashraf, D.O. [facility] [address], [place] [address] Cell Phone (Mon-Fri 8am-5pm):  [phone] On Call:  [phone] & follow prompts after 5pm & weekends Office Phone:  [phone] Office Fax:  [phone]

## 2018-06-12 DIAGNOSIS — E611 Iron deficiency: Secondary | ICD-10-CM | POA: Diagnosis not present

## 2018-06-12 DIAGNOSIS — R5383 Other fatigue: Secondary | ICD-10-CM | POA: Diagnosis not present

## 2018-06-19 DIAGNOSIS — E559 Vitamin D deficiency, unspecified: Secondary | ICD-10-CM | POA: Diagnosis not present

## 2018-06-19 DIAGNOSIS — N951 Menopausal and female climacteric states: Secondary | ICD-10-CM | POA: Diagnosis not present

## 2018-06-19 DIAGNOSIS — E611 Iron deficiency: Secondary | ICD-10-CM | POA: Diagnosis not present

## 2018-06-19 DIAGNOSIS — R6889 Other general symptoms and signs: Secondary | ICD-10-CM | POA: Diagnosis not present

## 2018-06-21 DIAGNOSIS — H43811 Vitreous degeneration, right eye: Secondary | ICD-10-CM | POA: Diagnosis not present

## 2018-06-21 DIAGNOSIS — H0102A Squamous blepharitis right eye, upper and lower eyelids: Secondary | ICD-10-CM | POA: Diagnosis not present

## 2018-06-21 DIAGNOSIS — H26492 Other secondary cataract, left eye: Secondary | ICD-10-CM | POA: Diagnosis not present

## 2018-06-21 DIAGNOSIS — H353131 Nonexudative age-related macular degeneration, bilateral, early dry stage: Secondary | ICD-10-CM | POA: Diagnosis not present

## 2018-06-21 DIAGNOSIS — H0102B Squamous blepharitis left eye, upper and lower eyelids: Secondary | ICD-10-CM | POA: Diagnosis not present

## 2018-06-21 DIAGNOSIS — H04123 Dry eye syndrome of bilateral lacrimal glands: Secondary | ICD-10-CM | POA: Diagnosis not present

## 2018-07-09 ENCOUNTER — Other Ambulatory Visit: Payer: Self-pay

## 2018-07-09 ENCOUNTER — Other Ambulatory Visit: Payer: Medicare HMO

## 2018-07-09 DIAGNOSIS — E890 Postprocedural hypothyroidism: Secondary | ICD-10-CM | POA: Diagnosis not present

## 2018-07-09 DIAGNOSIS — E559 Vitamin D deficiency, unspecified: Secondary | ICD-10-CM

## 2018-07-09 LAB — TSH: TSH: 3.88 mIU/L (ref 0.40–4.50)

## 2018-07-10 ENCOUNTER — Encounter: Payer: Self-pay | Admitting: *Deleted

## 2018-07-10 LAB — VITAMIN D 25 HYDROXY (VIT D DEFICIENCY, FRACTURES): Vit D, 25-Hydroxy: 51 ng/mL (ref 30–100)

## 2018-07-11 ENCOUNTER — Encounter: Payer: Self-pay | Admitting: Internal Medicine

## 2018-08-27 DIAGNOSIS — M2550 Pain in unspecified joint: Secondary | ICD-10-CM | POA: Diagnosis not present

## 2018-08-27 DIAGNOSIS — F429 Obsessive-compulsive disorder, unspecified: Secondary | ICD-10-CM | POA: Diagnosis not present

## 2018-08-27 DIAGNOSIS — Z008 Encounter for other general examination: Secondary | ICD-10-CM | POA: Diagnosis not present

## 2018-08-27 DIAGNOSIS — Z6820 Body mass index (BMI) 20.0-20.9, adult: Secondary | ICD-10-CM | POA: Diagnosis not present

## 2018-08-27 DIAGNOSIS — R2681 Unsteadiness on feet: Secondary | ICD-10-CM | POA: Diagnosis not present

## 2018-08-27 DIAGNOSIS — Z0000 Encounter for general adult medical examination without abnormal findings: Secondary | ICD-10-CM | POA: Diagnosis not present

## 2018-08-27 DIAGNOSIS — Z79899 Other long term (current) drug therapy: Secondary | ICD-10-CM | POA: Diagnosis not present

## 2018-08-27 DIAGNOSIS — F0150 Vascular dementia without behavioral disturbance: Secondary | ICD-10-CM | POA: Diagnosis not present

## 2018-08-27 DIAGNOSIS — M810 Age-related osteoporosis without current pathological fracture: Secondary | ICD-10-CM | POA: Diagnosis not present

## 2018-09-12 DIAGNOSIS — I77811 Abdominal aortic ectasia: Secondary | ICD-10-CM | POA: Diagnosis not present

## 2018-09-12 DIAGNOSIS — Z0001 Encounter for general adult medical examination with abnormal findings: Secondary | ICD-10-CM | POA: Diagnosis not present

## 2018-09-12 DIAGNOSIS — M3501 Sicca syndrome with keratoconjunctivitis: Secondary | ICD-10-CM | POA: Diagnosis not present

## 2018-09-12 DIAGNOSIS — Z008 Encounter for other general examination: Secondary | ICD-10-CM | POA: Diagnosis not present

## 2018-09-12 DIAGNOSIS — Z0000 Encounter for general adult medical examination without abnormal findings: Secondary | ICD-10-CM | POA: Diagnosis not present

## 2018-09-12 DIAGNOSIS — E46 Unspecified protein-calorie malnutrition: Secondary | ICD-10-CM | POA: Diagnosis not present

## 2018-09-12 DIAGNOSIS — M810 Age-related osteoporosis without current pathological fracture: Secondary | ICD-10-CM | POA: Diagnosis not present

## 2018-09-12 DIAGNOSIS — F0150 Vascular dementia without behavioral disturbance: Secondary | ICD-10-CM | POA: Diagnosis not present

## 2018-09-12 DIAGNOSIS — Z853 Personal history of malignant neoplasm of breast: Secondary | ICD-10-CM | POA: Diagnosis not present

## 2018-09-18 DIAGNOSIS — R5383 Other fatigue: Secondary | ICD-10-CM | POA: Diagnosis not present

## 2018-10-02 DIAGNOSIS — E039 Hypothyroidism, unspecified: Secondary | ICD-10-CM | POA: Diagnosis not present

## 2018-10-23 DIAGNOSIS — Z77120 Contact with and (suspected) exposure to mold (toxic): Secondary | ICD-10-CM | POA: Diagnosis not present

## 2018-10-23 DIAGNOSIS — E638 Other specified nutritional deficiencies: Secondary | ICD-10-CM | POA: Diagnosis not present

## 2018-10-23 DIAGNOSIS — N959 Unspecified menopausal and perimenopausal disorder: Secondary | ICD-10-CM | POA: Diagnosis not present

## 2018-10-23 DIAGNOSIS — Z853 Personal history of malignant neoplasm of breast: Secondary | ICD-10-CM | POA: Diagnosis not present

## 2018-10-23 DIAGNOSIS — M40209 Unspecified kyphosis, site unspecified: Secondary | ICD-10-CM | POA: Diagnosis not present

## 2018-10-23 DIAGNOSIS — R413 Other amnesia: Secondary | ICD-10-CM | POA: Diagnosis not present

## 2018-10-23 DIAGNOSIS — T5691XD Toxic effect of unspecified metal, accidental (unintentional), subsequent encounter: Secondary | ICD-10-CM | POA: Diagnosis not present

## 2018-10-23 DIAGNOSIS — H04129 Dry eye syndrome of unspecified lacrimal gland: Secondary | ICD-10-CM | POA: Diagnosis not present

## 2018-10-23 DIAGNOSIS — Z8619 Personal history of other infectious and parasitic diseases: Secondary | ICD-10-CM | POA: Diagnosis not present

## 2018-10-24 ENCOUNTER — Other Ambulatory Visit: Payer: Self-pay

## 2018-10-26 ENCOUNTER — Emergency Department (HOSPITAL_COMMUNITY): Payer: Medicare HMO

## 2018-10-26 ENCOUNTER — Other Ambulatory Visit: Payer: Self-pay

## 2018-10-26 ENCOUNTER — Inpatient Hospital Stay (HOSPITAL_COMMUNITY)
Admission: EM | Admit: 2018-10-26 | Discharge: 2018-10-29 | DRG: 552 | Disposition: A | Discharge status: Home Health | Payer: Medicare HMO | Attending: Internal Medicine | Admitting: Internal Medicine

## 2018-10-26 ENCOUNTER — Encounter (HOSPITAL_COMMUNITY): Payer: Self-pay

## 2018-10-26 DIAGNOSIS — Z90722 Acquired absence of ovaries, bilateral: Secondary | ICD-10-CM | POA: Diagnosis not present

## 2018-10-26 DIAGNOSIS — Z853 Personal history of malignant neoplasm of breast: Secondary | ICD-10-CM

## 2018-10-26 DIAGNOSIS — H3530 Unspecified macular degeneration: Secondary | ICD-10-CM | POA: Diagnosis present

## 2018-10-26 DIAGNOSIS — Z23 Encounter for immunization: Secondary | ICD-10-CM | POA: Diagnosis present

## 2018-10-26 DIAGNOSIS — S0990XA Unspecified injury of head, initial encounter: Secondary | ICD-10-CM | POA: Diagnosis not present

## 2018-10-26 DIAGNOSIS — M810 Age-related osteoporosis without current pathological fracture: Secondary | ICD-10-CM | POA: Diagnosis present

## 2018-10-26 DIAGNOSIS — S32110A Nondisplaced Zone I fracture of sacrum, initial encounter for closed fracture: Principal | ICD-10-CM | POA: Diagnosis present

## 2018-10-26 DIAGNOSIS — Z8262 Family history of osteoporosis: Secondary | ICD-10-CM | POA: Diagnosis not present

## 2018-10-26 DIAGNOSIS — R52 Pain, unspecified: Secondary | ICD-10-CM | POA: Diagnosis not present

## 2018-10-26 DIAGNOSIS — S3992XA Unspecified injury of lower back, initial encounter: Secondary | ICD-10-CM | POA: Diagnosis not present

## 2018-10-26 DIAGNOSIS — F419 Anxiety disorder, unspecified: Secondary | ICD-10-CM | POA: Diagnosis present

## 2018-10-26 DIAGNOSIS — S301XXA Contusion of abdominal wall, initial encounter: Secondary | ICD-10-CM | POA: Diagnosis present

## 2018-10-26 DIAGNOSIS — R079 Chest pain, unspecified: Secondary | ICD-10-CM | POA: Diagnosis not present

## 2018-10-26 DIAGNOSIS — S32009A Unspecified fracture of unspecified lumbar vertebra, initial encounter for closed fracture: Secondary | ICD-10-CM

## 2018-10-26 DIAGNOSIS — S81811A Laceration without foreign body, right lower leg, initial encounter: Secondary | ICD-10-CM | POA: Diagnosis present

## 2018-10-26 DIAGNOSIS — S199XXA Unspecified injury of neck, initial encounter: Secondary | ICD-10-CM | POA: Diagnosis not present

## 2018-10-26 DIAGNOSIS — S32008A Other fracture of unspecified lumbar vertebra, initial encounter for closed fracture: Secondary | ICD-10-CM | POA: Diagnosis not present

## 2018-10-26 DIAGNOSIS — Z823 Family history of stroke: Secondary | ICD-10-CM

## 2018-10-26 DIAGNOSIS — R102 Pelvic and perineal pain: Secondary | ICD-10-CM | POA: Diagnosis not present

## 2018-10-26 DIAGNOSIS — Z79899 Other long term (current) drug therapy: Secondary | ICD-10-CM

## 2018-10-26 DIAGNOSIS — S32019A Unspecified fracture of first lumbar vertebra, initial encounter for closed fracture: Secondary | ICD-10-CM | POA: Diagnosis present

## 2018-10-26 DIAGNOSIS — Z20828 Contact with and (suspected) exposure to other viral communicable diseases: Secondary | ICD-10-CM | POA: Diagnosis present

## 2018-10-26 DIAGNOSIS — S22060D Wedge compression fracture of T7-T8 vertebra, subsequent encounter for fracture with routine healing: Secondary | ICD-10-CM

## 2018-10-26 DIAGNOSIS — S32039A Unspecified fracture of third lumbar vertebra, initial encounter for closed fracture: Secondary | ICD-10-CM | POA: Diagnosis present

## 2018-10-26 DIAGNOSIS — S3282XA Multiple fractures of pelvis without disruption of pelvic ring, initial encounter for closed fracture: Secondary | ICD-10-CM

## 2018-10-26 DIAGNOSIS — S01511A Laceration without foreign body of lip, initial encounter: Secondary | ICD-10-CM | POA: Diagnosis present

## 2018-10-26 DIAGNOSIS — Z03818 Encounter for observation for suspected exposure to other biological agents ruled out: Secondary | ICD-10-CM | POA: Diagnosis not present

## 2018-10-26 DIAGNOSIS — Z9013 Acquired absence of bilateral breasts and nipples: Secondary | ICD-10-CM

## 2018-10-26 DIAGNOSIS — S22060A Wedge compression fracture of T7-T8 vertebra, initial encounter for closed fracture: Secondary | ICD-10-CM

## 2018-10-26 DIAGNOSIS — T148XXA Other injury of unspecified body region, initial encounter: Secondary | ICD-10-CM | POA: Diagnosis not present

## 2018-10-26 DIAGNOSIS — X58XXXD Exposure to other specified factors, subsequent encounter: Secondary | ICD-10-CM | POA: Diagnosis present

## 2018-10-26 DIAGNOSIS — S22069A Unspecified fracture of T7-T8 vertebra, initial encounter for closed fracture: Secondary | ICD-10-CM | POA: Diagnosis not present

## 2018-10-26 DIAGNOSIS — S32591A Other specified fracture of right pubis, initial encounter for closed fracture: Secondary | ICD-10-CM | POA: Diagnosis not present

## 2018-10-26 DIAGNOSIS — S329XXA Fracture of unspecified parts of lumbosacral spine and pelvis, initial encounter for closed fracture: Secondary | ICD-10-CM | POA: Diagnosis not present

## 2018-10-26 DIAGNOSIS — D696 Thrombocytopenia, unspecified: Secondary | ICD-10-CM | POA: Diagnosis present

## 2018-10-26 DIAGNOSIS — M47816 Spondylosis without myelopathy or radiculopathy, lumbar region: Secondary | ICD-10-CM | POA: Diagnosis present

## 2018-10-26 DIAGNOSIS — I10 Essential (primary) hypertension: Secondary | ICD-10-CM | POA: Diagnosis not present

## 2018-10-26 DIAGNOSIS — S32029A Unspecified fracture of second lumbar vertebra, initial encounter for closed fracture: Secondary | ICD-10-CM | POA: Diagnosis present

## 2018-10-26 DIAGNOSIS — R58 Hemorrhage, not elsewhere classified: Secondary | ICD-10-CM | POA: Diagnosis not present

## 2018-10-26 DIAGNOSIS — M545 Low back pain: Secondary | ICD-10-CM | POA: Diagnosis not present

## 2018-10-26 DIAGNOSIS — N390 Urinary tract infection, site not specified: Secondary | ICD-10-CM | POA: Diagnosis present

## 2018-10-26 DIAGNOSIS — S3210XA Unspecified fracture of sacrum, initial encounter for closed fracture: Secondary | ICD-10-CM | POA: Diagnosis not present

## 2018-10-26 DIAGNOSIS — Y92410 Unspecified street and highway as the place of occurrence of the external cause: Secondary | ICD-10-CM | POA: Diagnosis not present

## 2018-10-26 DIAGNOSIS — R0902 Hypoxemia: Secondary | ICD-10-CM | POA: Diagnosis not present

## 2018-10-26 DIAGNOSIS — M5134 Other intervertebral disc degeneration, thoracic region: Secondary | ICD-10-CM | POA: Diagnosis not present

## 2018-10-26 DIAGNOSIS — S299XXA Unspecified injury of thorax, initial encounter: Secondary | ICD-10-CM | POA: Diagnosis not present

## 2018-10-26 DIAGNOSIS — S32018A Other fracture of first lumbar vertebra, initial encounter for closed fracture: Secondary | ICD-10-CM | POA: Diagnosis not present

## 2018-10-26 DIAGNOSIS — R404 Transient alteration of awareness: Secondary | ICD-10-CM | POA: Diagnosis not present

## 2018-10-26 DIAGNOSIS — S3993XA Unspecified injury of pelvis, initial encounter: Secondary | ICD-10-CM | POA: Diagnosis not present

## 2018-10-26 LAB — CBC WITH DIFFERENTIAL/PLATELET
Abs Immature Granulocytes: 0.09 10*3/uL — ABNORMAL HIGH (ref 0.00–0.07)
Basophils Absolute: 0 10*3/uL (ref 0.0–0.1)
Basophils Relative: 0 %
Eosinophils Absolute: 0 10*3/uL (ref 0.0–0.5)
Eosinophils Relative: 0 %
HCT: 37 % (ref 36.0–46.0)
Hemoglobin: 12.9 g/dL (ref 12.0–15.0)
Immature Granulocytes: 1 %
Lymphocytes Relative: 9 %
Lymphs Abs: 1.5 10*3/uL (ref 0.7–4.0)
MCH: 33.2 pg (ref 26.0–34.0)
MCHC: 34.9 g/dL (ref 30.0–36.0)
MCV: 95.4 fL (ref 80.0–100.0)
Monocytes Absolute: 1.3 10*3/uL — ABNORMAL HIGH (ref 0.1–1.0)
Monocytes Relative: 8 %
Neutro Abs: 13.7 10*3/uL — ABNORMAL HIGH (ref 1.7–7.7)
Neutrophils Relative %: 82 %
Platelets: 123 10*3/uL — ABNORMAL LOW (ref 150–400)
RBC: 3.88 MIL/uL (ref 3.87–5.11)
RDW: 12.9 % (ref 11.5–15.5)
WBC: 16.7 10*3/uL — ABNORMAL HIGH (ref 4.0–10.5)
nRBC: 0 % (ref 0.0–0.2)

## 2018-10-26 LAB — COMPREHENSIVE METABOLIC PANEL
ALT: 53 U/L — ABNORMAL HIGH (ref 0–44)
AST: 87 U/L — ABNORMAL HIGH (ref 15–41)
Albumin: 3.6 g/dL (ref 3.5–5.0)
Alkaline Phosphatase: 69 U/L (ref 38–126)
Anion gap: 9 (ref 5–15)
BUN: 15 mg/dL (ref 8–23)
CO2: 28 mmol/L (ref 22–32)
Calcium: 8.9 mg/dL (ref 8.9–10.3)
Chloride: 100 mmol/L (ref 98–111)
Creatinine, Ser: 0.64 mg/dL (ref 0.44–1.00)
GFR calc Af Amer: >60 mL/min (ref >60)
GFR calc non Af Amer: >60 mL/min (ref >60)
Glucose, Bld: 136 mg/dL — ABNORMAL HIGH (ref 70–99)
Potassium: 3.7 mmol/L (ref 3.5–5.1)
Sodium: 137 mmol/L (ref 135–145)
Total Bilirubin: 0.9 mg/dL (ref 0.3–1.2)
Total Protein: 5.9 g/dL — ABNORMAL LOW (ref 6.5–8.1)

## 2018-10-26 LAB — URINALYSIS, ROUTINE W REFLEX MICROSCOPIC
Bilirubin Urine: NEGATIVE
Glucose, UA: NEGATIVE mg/dL
Ketones, ur: >80 mg/dL — AB
Nitrite: NEGATIVE
Protein, ur: NEGATIVE mg/dL
Specific Gravity, Urine: 1.02 (ref 1.005–1.030)
pH: 7 (ref 5.0–8.0)

## 2018-10-26 LAB — URINALYSIS, MICROSCOPIC (REFLEX)

## 2018-10-26 MED ORDER — IOHEXOL 350 MG/ML SOLN
100.0000 mL | Freq: Once | INTRAVENOUS | Status: AC | PRN
  Administered 2018-10-26: 100 mL via INTRAVENOUS

## 2018-10-26 MED ORDER — ACETAMINOPHEN 325 MG PO TABS
650.0000 mg | ORAL_TABLET | Freq: Once | ORAL | Status: AC
  Administered 2018-10-26: 650 mg via ORAL
  Filled 2018-10-26: qty 2

## 2018-10-26 MED ORDER — FENTANYL CITRATE (PF) 100 MCG/2ML IJ SOLN
50.0000 ug | Freq: Once | INTRAMUSCULAR | Status: AC
  Administered 2018-10-26: 50 ug via INTRAVENOUS
  Filled 2018-10-26: qty 2

## 2018-10-26 MED ORDER — METOCLOPRAMIDE HCL 5 MG/ML IJ SOLN
10.0000 mg | Freq: Once | INTRAMUSCULAR | Status: AC
  Administered 2018-10-26: 10 mg via INTRAVENOUS
  Filled 2018-10-26: qty 2

## 2018-10-26 MED ORDER — SODIUM CHLORIDE 0.9 % IV BOLUS
1000.0000 mL | Freq: Once | INTRAVENOUS | Status: AC
  Administered 2018-10-26: 1000 mL via INTRAVENOUS

## 2018-10-26 MED ORDER — ONDANSETRON HCL 4 MG/2ML IJ SOLN
4.0000 mg | Freq: Once | INTRAMUSCULAR | Status: AC
  Administered 2018-10-26: 4 mg via INTRAVENOUS
  Filled 2018-10-26: qty 2

## 2018-10-26 MED ORDER — LIDOCAINE HCL (PF) 1 % IJ SOLN
5.0000 mL | Freq: Once | INTRAMUSCULAR | Status: DC
  Filled 2018-10-26: qty 5
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
## 2018-10-26 NOTE — ED Notes (Signed)
Contusions was noted in the back of the patients head.

## 2018-10-26 NOTE — ED Notes (Signed)
Patient transported to X-ray 
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
## 2018-10-26 NOTE — ED Notes (Signed)
Patient verbalized contacing family [place] [phone]), Pete Pelt in room assessing patient.

## 2018-10-27 ENCOUNTER — Encounter (HOSPITAL_COMMUNITY): Payer: Self-pay | Admitting: Family Medicine

## 2018-10-27 ENCOUNTER — Other Ambulatory Visit: Payer: Self-pay

## 2018-10-27 ENCOUNTER — Observation Stay (HOSPITAL_COMMUNITY): Payer: Medicare HMO

## 2018-10-27 DIAGNOSIS — S329XXA Fracture of unspecified parts of lumbosacral spine and pelvis, initial encounter for closed fracture: Secondary | ICD-10-CM | POA: Diagnosis present

## 2018-10-27 DIAGNOSIS — S32009A Unspecified fracture of unspecified lumbar vertebra, initial encounter for closed fracture: Secondary | ICD-10-CM | POA: Diagnosis not present

## 2018-10-27 DIAGNOSIS — T148XXA Other injury of unspecified body region, initial encounter: Secondary | ICD-10-CM | POA: Diagnosis not present

## 2018-10-27 DIAGNOSIS — S3282XA Multiple fractures of pelvis without disruption of pelvic ring, initial encounter for closed fracture: Secondary | ICD-10-CM | POA: Diagnosis not present

## 2018-10-27 DIAGNOSIS — S3210XA Unspecified fracture of sacrum, initial encounter for closed fracture: Secondary | ICD-10-CM | POA: Diagnosis not present

## 2018-10-27 DIAGNOSIS — S22060A Wedge compression fracture of T7-T8 vertebra, initial encounter for closed fracture: Secondary | ICD-10-CM | POA: Diagnosis present

## 2018-10-27 DIAGNOSIS — S22069A Unspecified fracture of T7-T8 vertebra, initial encounter for closed fracture: Secondary | ICD-10-CM | POA: Diagnosis not present

## 2018-10-27 DIAGNOSIS — S01511A Laceration without foreign body of lip, initial encounter: Secondary | ICD-10-CM | POA: Diagnosis not present

## 2018-10-27 DIAGNOSIS — M5134 Other intervertebral disc degeneration, thoracic region: Secondary | ICD-10-CM | POA: Diagnosis not present

## 2018-10-27 DIAGNOSIS — M810 Age-related osteoporosis without current pathological fracture: Secondary | ICD-10-CM | POA: Diagnosis not present

## 2018-10-27 LAB — CBC WITH DIFFERENTIAL/PLATELET
Abs Immature Granulocytes: 0.05 10*3/uL (ref 0.00–0.07)
Basophils Absolute: 0 10*3/uL (ref 0.0–0.1)
Basophils Relative: 0 %
Eosinophils Absolute: 0 10*3/uL (ref 0.0–0.5)
Eosinophils Relative: 0 %
HCT: 36 % (ref 36.0–46.0)
Hemoglobin: 12.2 g/dL (ref 12.0–15.0)
Immature Granulocytes: 0 %
Lymphocytes Relative: 6 %
Lymphs Abs: 0.8 10*3/uL (ref 0.7–4.0)
MCH: 32.2 pg (ref 26.0–34.0)
MCHC: 33.9 g/dL (ref 30.0–36.0)
MCV: 95 fL (ref 80.0–100.0)
Monocytes Absolute: 1.2 10*3/uL — ABNORMAL HIGH (ref 0.1–1.0)
Monocytes Relative: 9 %
Neutro Abs: 11.6 10*3/uL — ABNORMAL HIGH (ref 1.7–7.7)
Neutrophils Relative %: 85 %
Platelets: 87 10*3/uL — ABNORMAL LOW (ref 150–400)
RBC: 3.79 MIL/uL — ABNORMAL LOW (ref 3.87–5.11)
RDW: 13 % (ref 11.5–15.5)
WBC: 13.6 10*3/uL — ABNORMAL HIGH (ref 4.0–10.5)
nRBC: 0 % (ref 0.0–0.2)

## 2018-10-27 LAB — TYPE AND SCREEN
ABO/RH(D): A POS
Antibody Screen: NEGATIVE

## 2018-10-27 LAB — COMPREHENSIVE METABOLIC PANEL
ALT: 54 U/L — ABNORMAL HIGH (ref 0–44)
AST: 92 U/L — ABNORMAL HIGH (ref 15–41)
Albumin: 3.5 g/dL (ref 3.5–5.0)
Alkaline Phosphatase: 67 U/L (ref 38–126)
Anion gap: 9 (ref 5–15)
BUN: 10 mg/dL (ref 8–23)
CO2: 24 mmol/L (ref 22–32)
Calcium: 8.2 mg/dL — ABNORMAL LOW (ref 8.9–10.3)
Chloride: 100 mmol/L (ref 98–111)
Creatinine, Ser: 0.67 mg/dL (ref 0.44–1.00)
GFR calc Af Amer: >60 mL/min (ref >60)
GFR calc non Af Amer: >60 mL/min (ref >60)
Glucose, Bld: 184 mg/dL — ABNORMAL HIGH (ref 70–99)
Potassium: 4 mmol/L (ref 3.5–5.1)
Sodium: 133 mmol/L — ABNORMAL LOW (ref 135–145)
Total Bilirubin: 0.5 mg/dL (ref 0.3–1.2)
Total Protein: 5.9 g/dL — ABNORMAL LOW (ref 6.5–8.1)

## 2018-10-27 LAB — GLUCOSE, CAPILLARY: Glucose-Capillary: 158 mg/dL — ABNORMAL HIGH (ref 70–99)

## 2018-10-27 LAB — ABO/RH: ABO/RH(D): A POS

## 2018-10-27 LAB — SARS CORONAVIRUS 2 BY RT PCR (HOSPITAL ORDER, PERFORMED IN ~~LOC~~ HOSPITAL LAB): SARS Coronavirus 2: NEGATIVE

## 2018-10-27 MED ORDER — TETANUS-DIPHTH-ACELL PERTUSSIS 5-2.5-18.5 LF-MCG/0.5 IM SUSP
0.5000 mL | Freq: Once | INTRAMUSCULAR | Status: AC
  Administered 2018-10-27: 0.5 mL via INTRAMUSCULAR
  Filled 2018-10-27: qty 0.5

## 2018-10-27 MED ORDER — SODIUM CHLORIDE 0.9 % IV SOLN
1.0000 g | Freq: Once | INTRAVENOUS | Status: AC
  Administered 2018-10-27: 1 g via INTRAVENOUS
  Filled 2018-10-27: qty 10

## 2018-10-27 MED ORDER — ONDANSETRON HCL 4 MG PO TABS: 4.0000 mg | ORAL_TABLET | Freq: Four times a day (QID) | ORAL | Status: DC | PRN

## 2018-10-27 MED ORDER — SODIUM CHLORIDE 0.45 % IV SOLN
INTRAVENOUS | Status: DC
  Administered 2018-10-27 (×2): via INTRAVENOUS

## 2018-10-27 MED ORDER — ACETAMINOPHEN 325 MG PO TABS
650.0000 mg | ORAL_TABLET | Freq: Four times a day (QID) | ORAL | Status: DC | PRN
  Administered 2018-10-28: 650 mg via ORAL
  Filled 2018-10-27: qty 2

## 2018-10-27 MED ORDER — FENTANYL CITRATE (PF) 100 MCG/2ML IJ SOLN: 25.0000 ug | INTRAMUSCULAR | Status: DC | PRN

## 2018-10-27 MED ORDER — SENNA 8.6 MG PO TABS
1.0000 | ORAL_TABLET | Freq: Two times a day (BID) | ORAL | Status: DC
  Administered 2018-10-27 – 2018-10-29 (×5): 8.6 mg via ORAL
  Filled 2018-10-27 (×5): qty 1

## 2018-10-27 MED ORDER — CYCLOSPORINE 0.05 % OP EMUL
1.0000 [drp] | Freq: Two times a day (BID) | OPHTHALMIC | Status: DC
  Administered 2018-10-27 – 2018-10-29 (×5): 1 [drp] via OPHTHALMIC
  Filled 2018-10-27 (×6): qty 30

## 2018-10-27 MED ORDER — BISACODYL 5 MG PO TBEC: 5.0000 mg | DELAYED_RELEASE_TABLET | Freq: Every day | ORAL | Status: DC | PRN

## 2018-10-27 MED ORDER — SODIUM CHLORIDE 0.9% FLUSH
3.0000 mL | Freq: Two times a day (BID) | INTRAVENOUS | Status: DC
  Administered 2018-10-27 – 2018-10-29 (×4): 3 mL via INTRAVENOUS

## 2018-10-27 MED ORDER — METHOCARBAMOL 1000 MG/10ML IJ SOLN
500.0000 mg | Freq: Four times a day (QID) | INTRAVENOUS | Status: DC | PRN
  Administered 2018-10-27: 500 mg via INTRAVENOUS
  Filled 2018-10-27 (×2): qty 5

## 2018-10-27 MED ORDER — POLYETHYLENE GLYCOL 3350 17 G PO PACK: 17.0000 g | PACK | Freq: Every day | ORAL | Status: DC | PRN

## 2018-10-27 MED ORDER — ONDANSETRON HCL 4 MG/2ML IJ SOLN: 4.0000 mg | Freq: Four times a day (QID) | INTRAMUSCULAR | Status: DC | PRN

## 2018-10-27 MED ORDER — HYDROCODONE-ACETAMINOPHEN 5-325 MG PO TABS
1.0000 | ORAL_TABLET | ORAL | Status: DC | PRN
  Administered 2018-10-27: 1 via ORAL
  Filled 2018-10-27: qty 1

## 2018-10-27 MED ORDER — ACETAMINOPHEN 650 MG RE SUPP: 650.0000 mg | Freq: Four times a day (QID) | RECTAL | Status: DC | PRN

## 2018-10-27 NOTE — Progress Notes (Signed)
Central Telemetry notified RN that patient needs order for cardiac monitoring since 0800.  RN paged [facility] for cardiac monitoring order.
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
## 2018-10-27 NOTE — Progress Notes (Signed)
Patient ID: Yolanda Robbins, female   DOB: 1941-01-08, 78 y.o.   MRN: [id number] MRI shows remote T7 fx. No acute injury. We will sign off. Please call if we can be of any assistance
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
## 2018-10-27 NOTE — Progress Notes (Signed)
Patient ID: Yolanda Robbins, female   DOB: [birth year], 78 y.o.   MRN: [id number] Patient complains of some low back pain but no thoracic pain.  No leg pain or numbness tingling or weakness.  Images reviewed.  Lumbar transverse process fractures are stable and only at the tips, this requires no bracing but will call some back discomfort for a few weeks.  T7 fracture is mild with no kyphosis and no retropulsion.  Age-indeterminate.  We will ask for MRI of thoracic spine to determine age of fracture to determine whether or not she needs to be braced.
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
## 2018-10-27 NOTE — Plan of Care (Signed)
  Problem: Pain Managment: Goal: General experience of comfort will improve Outcome: Progressing   

## 2018-10-27 NOTE — Progress Notes (Signed)
Patient placed in observation after midnight but care began in the ER prior to midnight.  Here with t7 fracture as well as non-operative pelvic fractures.  Consults from NS, trauma and orthopedics are appreciated.  MRI of thoracic spine pending.  Pain uncontrolled- 3 doses of fentanyl last night and norco this AM.  Continue to monitor for improvement.  PT eval for tomorrow placed. Eulogio Bear DO
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
## 2018-10-28 DIAGNOSIS — F419 Anxiety disorder, unspecified: Secondary | ICD-10-CM | POA: Diagnosis present

## 2018-10-28 DIAGNOSIS — Z8262 Family history of osteoporosis: Secondary | ICD-10-CM | POA: Diagnosis not present

## 2018-10-28 DIAGNOSIS — S32029A Unspecified fracture of second lumbar vertebra, initial encounter for closed fracture: Secondary | ICD-10-CM | POA: Diagnosis present

## 2018-10-28 DIAGNOSIS — N390 Urinary tract infection, site not specified: Secondary | ICD-10-CM | POA: Diagnosis present

## 2018-10-28 DIAGNOSIS — Z79899 Other long term (current) drug therapy: Secondary | ICD-10-CM | POA: Diagnosis not present

## 2018-10-28 DIAGNOSIS — Z823 Family history of stroke: Secondary | ICD-10-CM | POA: Diagnosis not present

## 2018-10-28 DIAGNOSIS — Z90722 Acquired absence of ovaries, bilateral: Secondary | ICD-10-CM | POA: Diagnosis not present

## 2018-10-28 DIAGNOSIS — S32039A Unspecified fracture of third lumbar vertebra, initial encounter for closed fracture: Secondary | ICD-10-CM | POA: Diagnosis present

## 2018-10-28 DIAGNOSIS — S301XXA Contusion of abdominal wall, initial encounter: Secondary | ICD-10-CM | POA: Diagnosis present

## 2018-10-28 DIAGNOSIS — H3530 Unspecified macular degeneration: Secondary | ICD-10-CM | POA: Diagnosis present

## 2018-10-28 DIAGNOSIS — S81811A Laceration without foreign body, right lower leg, initial encounter: Secondary | ICD-10-CM | POA: Diagnosis present

## 2018-10-28 DIAGNOSIS — Z20828 Contact with and (suspected) exposure to other viral communicable diseases: Secondary | ICD-10-CM | POA: Diagnosis present

## 2018-10-28 DIAGNOSIS — S32110A Nondisplaced Zone I fracture of sacrum, initial encounter for closed fracture: Secondary | ICD-10-CM | POA: Diagnosis present

## 2018-10-28 DIAGNOSIS — S3282XA Multiple fractures of pelvis without disruption of pelvic ring, initial encounter for closed fracture: Secondary | ICD-10-CM | POA: Diagnosis present

## 2018-10-28 DIAGNOSIS — Z853 Personal history of malignant neoplasm of breast: Secondary | ICD-10-CM | POA: Diagnosis not present

## 2018-10-28 DIAGNOSIS — D696 Thrombocytopenia, unspecified: Secondary | ICD-10-CM | POA: Diagnosis present

## 2018-10-28 DIAGNOSIS — S01511A Laceration without foreign body of lip, initial encounter: Secondary | ICD-10-CM | POA: Diagnosis present

## 2018-10-28 DIAGNOSIS — S32019A Unspecified fracture of first lumbar vertebra, initial encounter for closed fracture: Secondary | ICD-10-CM | POA: Diagnosis present

## 2018-10-28 DIAGNOSIS — Z9013 Acquired absence of bilateral breasts and nipples: Secondary | ICD-10-CM | POA: Diagnosis not present

## 2018-10-28 DIAGNOSIS — M810 Age-related osteoporosis without current pathological fracture: Secondary | ICD-10-CM | POA: Diagnosis present

## 2018-10-28 DIAGNOSIS — X58XXXD Exposure to other specified factors, subsequent encounter: Secondary | ICD-10-CM | POA: Diagnosis present

## 2018-10-28 DIAGNOSIS — M47816 Spondylosis without myelopathy or radiculopathy, lumbar region: Secondary | ICD-10-CM | POA: Diagnosis present

## 2018-10-28 DIAGNOSIS — S22060D Wedge compression fracture of T7-T8 vertebra, subsequent encounter for fracture with routine healing: Secondary | ICD-10-CM | POA: Diagnosis not present

## 2018-10-28 DIAGNOSIS — S22060A Wedge compression fracture of T7-T8 vertebra, initial encounter for closed fracture: Secondary | ICD-10-CM | POA: Diagnosis not present

## 2018-10-28 DIAGNOSIS — S22069A Unspecified fracture of T7-T8 vertebra, initial encounter for closed fracture: Secondary | ICD-10-CM | POA: Diagnosis not present

## 2018-10-28 DIAGNOSIS — S3210XA Unspecified fracture of sacrum, initial encounter for closed fracture: Secondary | ICD-10-CM | POA: Diagnosis not present

## 2018-10-28 DIAGNOSIS — Y92410 Unspecified street and highway as the place of occurrence of the external cause: Secondary | ICD-10-CM | POA: Diagnosis not present

## 2018-10-28 DIAGNOSIS — Z23 Encounter for immunization: Secondary | ICD-10-CM | POA: Diagnosis present

## 2018-10-28 LAB — URINE CULTURE

## 2018-10-28 LAB — GLUCOSE, CAPILLARY: Glucose-Capillary: 102 mg/dL — ABNORMAL HIGH (ref 70–99)

## 2018-10-28 MED ORDER — OXYCODONE HCL 5 MG PO TABS: 5.0000 mg | ORAL_TABLET | Freq: Four times a day (QID) | ORAL | Status: DC | PRN

## 2018-10-28 MED ORDER — ACETAMINOPHEN 500 MG PO TABS
1000.0000 mg | ORAL_TABLET | Freq: Three times a day (TID) | ORAL | Status: DC
  Administered 2018-10-28 – 2018-10-29 (×3): 1000 mg via ORAL
  Filled 2018-10-28 (×3): qty 2

## 2018-10-28 NOTE — Care Management Obs Status (Signed)
[place] NOTIFICATION   Patient Details  Name: Yolanda Robbins MRN: [id number] Date of Birth: 1940/03/20   Medicare Observation Status Notification Given:  Yes    Claudie Leach, RN 10/28/2018, 11:33 AM
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
## 2018-10-28 NOTE — Progress Notes (Signed)
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 peritoneal and presacral space. Soft tissue stranding and infiltration superficial to the posterior sacrum, correlate with evaluation for decubitus ulcer. These results were called by telephone at the time of interpretation on 10/26/2018 at 11:44 pm to provider [facility] , who verbally acknowledged these results. Electronically Signed   By: Lovena Le M.D.   On: 10/26/2018 23:45    Medications:    acetaminophen  1,000 mg Oral TID   cycloSPORINE  1 drop Both Eyes BID   lidocaine (PF)  5 mL Infiltration Once   senna  1 tablet Oral BID   sodium chloride flush  3 mL Intravenous Q12H   Continuous Infusions:  methocarbamol (ROBAXIN) IV Stopped (10/27/18 0539)     LOS: 0 days   Geradine Girt  [facility]   How to contact the [facility] Attending or Consulting provider [place] or covering provider during after hours [place], for this patient?  1. Check the care team in [facility] and look for a) attending/consulting [facility] provider listed and b) the [facility]  team listed 2. Log into [email or web address] and use [place]'s universal password to access. If you do not have the password, please contact the hospital operator. 3. Locate the [facility] provider you are looking for under [facility] and page to a number that you can be directly reached. 4. If you still have difficulty reaching the provider, please page the [facility] (Director on Call) for the Hospitalists listed on amion for assistance.  10/28/2018, 3:02 PM
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
## 2018-10-28 NOTE — Plan of Care (Signed)
  Problem: Education: Goal: Knowledge of General Education information will improve Description: Including pain rating scale, medication(s)/side effects and non-pharmacologic comfort measures Outcome: Progressing   Problem: Clinical Measurements: Goal: Ability to maintain clinical measurements within normal limits will improve Outcome: Progressing   Problem: Activity: Goal: Risk for activity intolerance will decrease Outcome: Progressing   Problem: Nutrition: Goal: Adequate nutrition will be maintained Outcome: Progressing   Problem: Pain Managment: Goal: General experience of comfort will improve Outcome: Progressing   Problem: Safety: Goal: Ability to remain free from injury will improve Outcome: Progressing   Problem: Skin Integrity: Goal: Risk for impaired skin integrity will decrease Outcome: Progressing   
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
## 2018-10-29 ENCOUNTER — Ambulatory Visit: Payer: Medicare HMO | Admitting: Obstetrics and Gynecology

## 2018-10-29 DIAGNOSIS — Z23 Encounter for immunization: Secondary | ICD-10-CM | POA: Diagnosis not present

## 2018-10-29 LAB — BASIC METABOLIC PANEL
Anion gap: 6 (ref 5–15)
BUN: 15 mg/dL (ref 8–23)
CO2: 28 mmol/L (ref 22–32)
Calcium: 8.8 mg/dL — ABNORMAL LOW (ref 8.9–10.3)
Chloride: 105 mmol/L (ref 98–111)
Creatinine, Ser: 0.66 mg/dL (ref 0.44–1.00)
GFR calc Af Amer: >60 mL/min (ref >60)
GFR calc non Af Amer: >60 mL/min (ref >60)
Glucose, Bld: 102 mg/dL — ABNORMAL HIGH (ref 70–99)
Potassium: 4.1 mmol/L (ref 3.5–5.1)
Sodium: 139 mmol/L (ref 135–145)

## 2018-10-29 LAB — CBC
HCT: 31.8 % — ABNORMAL LOW (ref 36.0–46.0)
Hemoglobin: 11.1 g/dL — ABNORMAL LOW (ref 12.0–15.0)
MCH: 32.8 pg (ref 26.0–34.0)
MCHC: 34.9 g/dL (ref 30.0–36.0)
MCV: 94.1 fL (ref 80.0–100.0)
Platelets: 76 10*3/uL — ABNORMAL LOW (ref 150–400)
RBC: 3.38 MIL/uL — ABNORMAL LOW (ref 3.87–5.11)
RDW: 13.1 % (ref 11.5–15.5)
WBC: 7.6 10*3/uL (ref 4.0–10.5)
nRBC: 0 % (ref 0.0–0.2)

## 2018-10-29 LAB — GLUCOSE, CAPILLARY
Glucose-Capillary: 83 mg/dL (ref 70–99)
Glucose-Capillary: 128 mg/dL — ABNORMAL HIGH (ref 70–99)

## 2018-10-29 MED ORDER — BACITRACIN-NEOMYCIN-POLYMYXIN OINTMENT TUBE
TOPICAL_OINTMENT | Freq: Two times a day (BID) | CUTANEOUS | Status: DC
  Administered 2018-10-29: 10:00:00 via TOPICAL
  Filled 2018-10-29: qty 14

## 2018-10-29 MED ORDER — ACETAMINOPHEN 500 MG PO TABS: 1000.0000 mg | ORAL_TABLET | Freq: Four times a day (QID) | ORAL | 0 refills | Status: DC | PRN

## 2018-10-29 MED ORDER — METHOCARBAMOL 500 MG PO TABS: 500.0000 mg | ORAL_TABLET | Freq: Four times a day (QID) | ORAL | Status: DC | PRN

## 2018-10-29 MED ORDER — OXYCODONE HCL 5 MG PO TABS: 5.0000 mg | ORAL_TABLET | Freq: Four times a day (QID) | ORAL | Status: DC | PRN

## 2018-10-29 MED ORDER — BACITRACIN-NEOMYCIN-POLYMYXIN OINTMENT TUBE: 1.0000 "application " | TOPICAL_OINTMENT | Freq: Two times a day (BID) | CUTANEOUS | 0 refills | Status: DC

## 2018-10-29 MED ORDER — MELATONIN 3 MG PO TABS: 3.0000 mg | ORAL_TABLET | Freq: Every day | ORAL | 0 refills | Status: DC

## 2018-10-29 NOTE — Plan of Care (Signed)
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
## 2018-10-29 NOTE — Discharge Summary (Signed)
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 Negative. Fact Sheet for Patients:  StrictlyIdeas.no Fact Sheet for Healthcare Providers: BankingDealers.co.za This test is not yet approved or cleared by the [place] FDA and has been authorized for detection and/or diagnosis of SARS-CoV-2 by FDA under an Emergency Use Authorization (EUA).  This EUA will remain in effect (meaning this test can be used) for the duration of the COVID-19 declaration under Section 564(b)(1) of the Act, 21 U.S.C. section 360bbb-3(b)(1), unless the authorization is terminated or revoked sooner. Performed at [facility], [place] [address] [place], [place] [address]      Discharge Instructions:   Discharge Instructions    Diet general   Complete by: As directed    Discharge instructions   Complete by: As directed    Home health-PT/OT 24 hour care by family for now Weight bearing as tolerated Non-adhesive dressing to leg with antibiotic ointment daily Apply ointment to lip BID 3-4 weeks  for repeat x-rays with ortho   Increase activity slowly    Complete by: As directed      Allergies as of 10/29/2018   No Known Allergies     Medication List    TAKE these medications   acetaminophen 500 MG tablet Commonly known as: TYLENOL Take 2 tablets (1,000 mg total) by mouth every 6 (six) hours as needed for mild pain.   ADK 5000-5000-500 UNIT-MCG Caps Take 1 tablet by mouth daily.   ASTAXANTHIN PO Take 1 capsule by mouth daily.   b complex vitamins tablet Take 1 tablet by mouth daily.   Boron 3 MG Caps Take 1 capsule by mouth daily.   CALCIUM 1000 + D PO Take 1 tablet by mouth daily.   IODINE STRONG PO Take 1 drop by mouth daily.   JUICE PLUS FIBRE PO Take 2 tablets by mouth daily. ORCHARD AND OMEGA JUICE PLUS IN THE MORNING, also taking 2 veggies in the evening   Melatonin 3 MG Tabs Take 1 tablet (3 mg total) by mouth at bedtime.   neomycin-bacitracin-polymyxin Oint Commonly known as: NEOSPORIN Apply 1 application topically 2 (two) times daily.   OVER THE COUNTER MEDICATION Take 5 tablets by mouth daily. MASTER, AMINO ACID 1,000   Prometrium 100 MG capsule Generic drug: progesterone Take 100 mg by mouth daily.   Restasis 0.05 % ophthalmic emulsion Generic drug: cycloSPORINE Place 1 drop into both eyes 2 (two) times daily.   Taurine 500 MG Caps Take 500 mg by mouth.   vitamin C 1000 MG tablet Take 1,000 mg by mouth  daily.   VITAMIN D-3 PO Take 2,500 Units by mouth daily. FROM ORGANIC LICHEN            Durable Medical Equipment  (From admission, onward)         Start     Ordered   10/28/18 1301  For home use only DME 3 n 1  Once     10/28/18 1302   10/28/18 1301  For home use only DME Walker rolling  Once    Question:  Patient needs a walker to treat with the following condition  Answer:  Pelvic fracture ([place])   10/28/18 1302         Follow-up Information    Haddix, Thomasene Lot, MD. Schedule an appointment as soon as possible for a visit in 4 week(s).   Specialty: Orthopedic Surgery Why:  appointment in 3-4 weeks for repeat x-rays of pelvis Contact information: [place] [address] [place], DO Follow up in 1 week(s).   Specialty: Geriatric Medicine Contact information: [place]. [place] [address] [phone]        Burney Gauze, FNP Follow up.   Specialty: Family Medicine Why: keep prior appointment Contact information: [address] [place] [address] [phone]            Time coordinating discharge: 35 min  Signed:  Geradine Girt DO  [facility] 10/29/2018, 12:42 PM
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
## 2018-10-30 ENCOUNTER — Telehealth: Payer: Self-pay | Admitting: *Deleted

## 2018-10-30 NOTE — Telephone Encounter (Signed)
I have made the 1st attempt to contact the patient or family member in charge, in order to follow up from recently being discharged from the hospital. I left a message on voicemail but I will make another attempt at a different time.  

## 2018-11-01 DIAGNOSIS — S81801D Unspecified open wound, right lower leg, subsequent encounter: Secondary | ICD-10-CM

## 2018-11-01 DIAGNOSIS — S32591D Other specified fracture of right pubis, subsequent encounter for fracture with routine healing: Secondary | ICD-10-CM | POA: Diagnosis not present

## 2018-11-01 DIAGNOSIS — S301XXD Contusion of abdominal wall, subsequent encounter: Secondary | ICD-10-CM | POA: Diagnosis not present

## 2018-11-01 DIAGNOSIS — S01511D Laceration without foreign body of lip, subsequent encounter: Secondary | ICD-10-CM

## 2018-11-01 DIAGNOSIS — S32119D Unspecified Zone I fracture of sacrum, subsequent encounter for fracture with routine healing: Secondary | ICD-10-CM

## 2018-11-01 DIAGNOSIS — S32029D Unspecified fracture of second lumbar vertebra, subsequent encounter for fracture with routine healing: Secondary | ICD-10-CM | POA: Diagnosis not present

## 2018-11-01 DIAGNOSIS — S22060D Wedge compression fracture of T7-T8 vertebra, subsequent encounter for fracture with routine healing: Secondary | ICD-10-CM

## 2018-11-01 DIAGNOSIS — S32019D Unspecified fracture of first lumbar vertebra, subsequent encounter for fracture with routine healing: Secondary | ICD-10-CM | POA: Diagnosis not present

## 2018-11-01 DIAGNOSIS — S32039D Unspecified fracture of third lumbar vertebra, subsequent encounter for fracture with routine healing: Secondary | ICD-10-CM | POA: Diagnosis not present

## 2018-11-01 NOTE — Telephone Encounter (Signed)
Transition Care Management Follow-up Telephone Call  Date of discharge and from where: 10/29/2018 [place]  How have you been since you were released from the hospital? Still in a lot of pain  Any questions or concerns? Yes  Requesting a Rx for a 4 wheel walker with seat  Items Reviewed:  Did the pt receive and understand the discharge instructions provided? Yes   Medications obtained and verified? Yes   Any new allergies since your discharge? No   Dietary orders reviewed? Yes  Do you have support at home? Yes   Other (ie: DME, Home Health, etc) Home Health-[facility]  Functional Questionnaire: (I = Independent and D = Dependent) ADL's: I with assistance  Bathing/Dressing- I with assistance   Meal Prep- Independent with assistance  Eating- I  Maintaining continence- I  Transferring/Ambulation- I with assistance  Managing Meds- I   Follow up appointments reviewed:    PCP Hospital f/u appt confirmed? Yes  Scheduled to see Dinah on 11/02/18 .  [facility] f/u appt confirmed? No    Are transportation arrangements needed? No   If their condition worsens, is the pt aware to call  their PCP or go to the ED? Yes  Was the patient provided with contact information for the PCP's office or ED? Yes  Was the pt encouraged to call back with questions or concerns? Yes

## 2018-11-01 NOTE — Telephone Encounter (Signed)
I have made the 2nd attempt to contact the patient or family member in charge, in order to follow up from recently being discharged from the hospital. I left a message on voicemail but I will make another attempt at a different time.  

## 2018-11-02 ENCOUNTER — Other Ambulatory Visit: Payer: Self-pay

## 2018-11-02 ENCOUNTER — Encounter: Payer: Self-pay | Admitting: Family

## 2018-11-02 ENCOUNTER — Ambulatory Visit (INDEPENDENT_AMBULATORY_CARE_PROVIDER_SITE_OTHER): Payer: Medicare HMO | Admitting: Family

## 2018-11-02 DIAGNOSIS — S32039D Unspecified fracture of third lumbar vertebra, subsequent encounter for fracture with routine healing: Secondary | ICD-10-CM | POA: Diagnosis not present

## 2018-11-02 DIAGNOSIS — R748 Abnormal levels of other serum enzymes: Secondary | ICD-10-CM | POA: Diagnosis not present

## 2018-11-02 DIAGNOSIS — S3282XA Multiple fractures of pelvis without disruption of pelvic ring, initial encounter for closed fracture: Secondary | ICD-10-CM | POA: Diagnosis not present

## 2018-11-02 DIAGNOSIS — M810 Age-related osteoporosis without current pathological fracture: Secondary | ICD-10-CM

## 2018-11-02 DIAGNOSIS — M545 Low back pain, unspecified: Secondary | ICD-10-CM

## 2018-11-02 DIAGNOSIS — S81811D Laceration without foreign body, right lower leg, subsequent encounter: Secondary | ICD-10-CM

## 2018-11-02 DIAGNOSIS — S32019D Unspecified fracture of first lumbar vertebra, subsequent encounter for fracture with routine healing: Secondary | ICD-10-CM | POA: Diagnosis not present

## 2018-11-02 DIAGNOSIS — S32591D Other specified fracture of right pubis, subsequent encounter for fracture with routine healing: Secondary | ICD-10-CM | POA: Diagnosis not present

## 2018-11-02 DIAGNOSIS — S01511D Laceration without foreign body of lip, subsequent encounter: Secondary | ICD-10-CM | POA: Diagnosis not present

## 2018-11-02 DIAGNOSIS — S22060D Wedge compression fracture of T7-T8 vertebra, subsequent encounter for fracture with routine healing: Secondary | ICD-10-CM | POA: Diagnosis not present

## 2018-11-02 DIAGNOSIS — S301XXD Contusion of abdominal wall, subsequent encounter: Secondary | ICD-10-CM | POA: Diagnosis not present

## 2018-11-02 DIAGNOSIS — S32009S Unspecified fracture of unspecified lumbar vertebra, sequela: Secondary | ICD-10-CM

## 2018-11-02 DIAGNOSIS — S81801D Unspecified open wound, right lower leg, subsequent encounter: Secondary | ICD-10-CM | POA: Diagnosis not present

## 2018-11-02 DIAGNOSIS — S32029D Unspecified fracture of second lumbar vertebra, subsequent encounter for fracture with routine healing: Secondary | ICD-10-CM | POA: Diagnosis not present

## 2018-11-02 DIAGNOSIS — S32119D Unspecified Zone I fracture of sacrum, subsequent encounter for fracture with routine healing: Secondary | ICD-10-CM | POA: Diagnosis not present

## 2018-11-02 MED ORDER — UNABLE TO FIND: 0 refills | Status: DC
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
## 2018-11-05 DIAGNOSIS — S22060D Wedge compression fracture of T7-T8 vertebra, subsequent encounter for fracture with routine healing: Secondary | ICD-10-CM | POA: Diagnosis not present

## 2018-11-05 DIAGNOSIS — S81801D Unspecified open wound, right lower leg, subsequent encounter: Secondary | ICD-10-CM | POA: Diagnosis not present

## 2018-11-05 DIAGNOSIS — S32019D Unspecified fracture of first lumbar vertebra, subsequent encounter for fracture with routine healing: Secondary | ICD-10-CM | POA: Diagnosis not present

## 2018-11-05 DIAGNOSIS — S32039D Unspecified fracture of third lumbar vertebra, subsequent encounter for fracture with routine healing: Secondary | ICD-10-CM | POA: Diagnosis not present

## 2018-11-05 DIAGNOSIS — S32591D Other specified fracture of right pubis, subsequent encounter for fracture with routine healing: Secondary | ICD-10-CM | POA: Diagnosis not present

## 2018-11-05 DIAGNOSIS — S301XXD Contusion of abdominal wall, subsequent encounter: Secondary | ICD-10-CM | POA: Diagnosis not present

## 2018-11-05 DIAGNOSIS — S32119D Unspecified Zone I fracture of sacrum, subsequent encounter for fracture with routine healing: Secondary | ICD-10-CM | POA: Diagnosis not present

## 2018-11-05 DIAGNOSIS — S01511D Laceration without foreign body of lip, subsequent encounter: Secondary | ICD-10-CM | POA: Diagnosis not present

## 2018-11-05 DIAGNOSIS — S32029D Unspecified fracture of second lumbar vertebra, subsequent encounter for fracture with routine healing: Secondary | ICD-10-CM | POA: Diagnosis not present

## 2018-11-06 DIAGNOSIS — S32029D Unspecified fracture of second lumbar vertebra, subsequent encounter for fracture with routine healing: Secondary | ICD-10-CM | POA: Diagnosis not present

## 2018-11-06 DIAGNOSIS — S32591D Other specified fracture of right pubis, subsequent encounter for fracture with routine healing: Secondary | ICD-10-CM | POA: Diagnosis not present

## 2018-11-06 DIAGNOSIS — S301XXD Contusion of abdominal wall, subsequent encounter: Secondary | ICD-10-CM | POA: Diagnosis not present

## 2018-11-06 DIAGNOSIS — S22060D Wedge compression fracture of T7-T8 vertebra, subsequent encounter for fracture with routine healing: Secondary | ICD-10-CM | POA: Diagnosis not present

## 2018-11-06 DIAGNOSIS — S32039D Unspecified fracture of third lumbar vertebra, subsequent encounter for fracture with routine healing: Secondary | ICD-10-CM | POA: Diagnosis not present

## 2018-11-06 DIAGNOSIS — S32019D Unspecified fracture of first lumbar vertebra, subsequent encounter for fracture with routine healing: Secondary | ICD-10-CM | POA: Diagnosis not present

## 2018-11-06 DIAGNOSIS — S01511D Laceration without foreign body of lip, subsequent encounter: Secondary | ICD-10-CM | POA: Diagnosis not present

## 2018-11-06 DIAGNOSIS — S32119D Unspecified Zone I fracture of sacrum, subsequent encounter for fracture with routine healing: Secondary | ICD-10-CM | POA: Diagnosis not present

## 2018-11-06 DIAGNOSIS — S81801D Unspecified open wound, right lower leg, subsequent encounter: Secondary | ICD-10-CM | POA: Diagnosis not present

## 2018-11-08 DIAGNOSIS — S32029D Unspecified fracture of second lumbar vertebra, subsequent encounter for fracture with routine healing: Secondary | ICD-10-CM | POA: Diagnosis not present

## 2018-11-08 DIAGNOSIS — S22060D Wedge compression fracture of T7-T8 vertebra, subsequent encounter for fracture with routine healing: Secondary | ICD-10-CM | POA: Diagnosis not present

## 2018-11-08 DIAGNOSIS — S81801D Unspecified open wound, right lower leg, subsequent encounter: Secondary | ICD-10-CM | POA: Diagnosis not present

## 2018-11-08 DIAGNOSIS — S01511D Laceration without foreign body of lip, subsequent encounter: Secondary | ICD-10-CM | POA: Diagnosis not present

## 2018-11-08 DIAGNOSIS — S32039D Unspecified fracture of third lumbar vertebra, subsequent encounter for fracture with routine healing: Secondary | ICD-10-CM | POA: Diagnosis not present

## 2018-11-08 DIAGNOSIS — S32119D Unspecified Zone I fracture of sacrum, subsequent encounter for fracture with routine healing: Secondary | ICD-10-CM | POA: Diagnosis not present

## 2018-11-08 DIAGNOSIS — S32591D Other specified fracture of right pubis, subsequent encounter for fracture with routine healing: Secondary | ICD-10-CM | POA: Diagnosis not present

## 2018-11-08 DIAGNOSIS — S301XXD Contusion of abdominal wall, subsequent encounter: Secondary | ICD-10-CM | POA: Diagnosis not present

## 2018-11-08 DIAGNOSIS — S32019D Unspecified fracture of first lumbar vertebra, subsequent encounter for fracture with routine healing: Secondary | ICD-10-CM | POA: Diagnosis not present

## 2018-11-09 DIAGNOSIS — S32591D Other specified fracture of right pubis, subsequent encounter for fracture with routine healing: Secondary | ICD-10-CM | POA: Diagnosis not present

## 2018-11-09 DIAGNOSIS — S32119D Unspecified Zone I fracture of sacrum, subsequent encounter for fracture with routine healing: Secondary | ICD-10-CM | POA: Diagnosis not present

## 2018-11-09 DIAGNOSIS — S32019D Unspecified fracture of first lumbar vertebra, subsequent encounter for fracture with routine healing: Secondary | ICD-10-CM | POA: Diagnosis not present

## 2018-11-09 DIAGNOSIS — S22060D Wedge compression fracture of T7-T8 vertebra, subsequent encounter for fracture with routine healing: Secondary | ICD-10-CM | POA: Diagnosis not present

## 2018-11-09 DIAGNOSIS — S81801D Unspecified open wound, right lower leg, subsequent encounter: Secondary | ICD-10-CM | POA: Diagnosis not present

## 2018-11-09 DIAGNOSIS — S01511D Laceration without foreign body of lip, subsequent encounter: Secondary | ICD-10-CM | POA: Diagnosis not present

## 2018-11-09 DIAGNOSIS — S32039D Unspecified fracture of third lumbar vertebra, subsequent encounter for fracture with routine healing: Secondary | ICD-10-CM | POA: Diagnosis not present

## 2018-11-09 DIAGNOSIS — S301XXD Contusion of abdominal wall, subsequent encounter: Secondary | ICD-10-CM | POA: Diagnosis not present

## 2018-11-09 DIAGNOSIS — S32029D Unspecified fracture of second lumbar vertebra, subsequent encounter for fracture with routine healing: Secondary | ICD-10-CM | POA: Diagnosis not present

## 2018-11-12 ENCOUNTER — Other Ambulatory Visit: Payer: Self-pay

## 2018-11-13 DIAGNOSIS — S32119D Unspecified Zone I fracture of sacrum, subsequent encounter for fracture with routine healing: Secondary | ICD-10-CM | POA: Diagnosis not present

## 2018-11-13 DIAGNOSIS — S32591D Other specified fracture of right pubis, subsequent encounter for fracture with routine healing: Secondary | ICD-10-CM | POA: Diagnosis not present

## 2018-11-13 DIAGNOSIS — S301XXD Contusion of abdominal wall, subsequent encounter: Secondary | ICD-10-CM | POA: Diagnosis not present

## 2018-11-13 DIAGNOSIS — S01511D Laceration without foreign body of lip, subsequent encounter: Secondary | ICD-10-CM | POA: Diagnosis not present

## 2018-11-13 DIAGNOSIS — S32039D Unspecified fracture of third lumbar vertebra, subsequent encounter for fracture with routine healing: Secondary | ICD-10-CM | POA: Diagnosis not present

## 2018-11-13 DIAGNOSIS — S22060D Wedge compression fracture of T7-T8 vertebra, subsequent encounter for fracture with routine healing: Secondary | ICD-10-CM | POA: Diagnosis not present

## 2018-11-13 DIAGNOSIS — S81801D Unspecified open wound, right lower leg, subsequent encounter: Secondary | ICD-10-CM | POA: Diagnosis not present

## 2018-11-13 DIAGNOSIS — S32019D Unspecified fracture of first lumbar vertebra, subsequent encounter for fracture with routine healing: Secondary | ICD-10-CM | POA: Diagnosis not present

## 2018-11-13 DIAGNOSIS — S32029D Unspecified fracture of second lumbar vertebra, subsequent encounter for fracture with routine healing: Secondary | ICD-10-CM | POA: Diagnosis not present

## 2018-11-14 ENCOUNTER — Telehealth: Payer: Self-pay
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
## 2018-11-15 ENCOUNTER — Encounter: Payer: Self-pay | Admitting: Obstetrics and Gynecology

## 2018-11-15 ENCOUNTER — Ambulatory Visit (INDEPENDENT_AMBULATORY_CARE_PROVIDER_SITE_OTHER): Payer: Medicare HMO | Admitting: Obstetrics and Gynecology

## 2018-11-15 ENCOUNTER — Other Ambulatory Visit: Payer: Self-pay

## 2018-11-15 VITALS — BP 150/66 | HR 66 | Temp 98.0°F | Ht 67.75 in | Wt 128.4 lb

## 2018-11-15 DIAGNOSIS — S32591D Other specified fracture of right pubis, subsequent encounter for fracture with routine healing: Secondary | ICD-10-CM | POA: Diagnosis not present

## 2018-11-15 DIAGNOSIS — S22060D Wedge compression fracture of T7-T8 vertebra, subsequent encounter for fracture with routine healing: Secondary | ICD-10-CM | POA: Diagnosis not present

## 2018-11-15 DIAGNOSIS — S32019D Unspecified fracture of first lumbar vertebra, subsequent encounter for fracture with routine healing: Secondary | ICD-10-CM | POA: Diagnosis not present

## 2018-11-15 DIAGNOSIS — Z4689 Encounter for fitting and adjustment of other specified devices: Secondary | ICD-10-CM

## 2018-11-15 DIAGNOSIS — S32119D Unspecified Zone I fracture of sacrum, subsequent encounter for fracture with routine healing: Secondary | ICD-10-CM | POA: Diagnosis not present

## 2018-11-15 DIAGNOSIS — S301XXD Contusion of abdominal wall, subsequent encounter: Secondary | ICD-10-CM | POA: Diagnosis not present

## 2018-11-15 DIAGNOSIS — R32 Unspecified urinary incontinence: Secondary | ICD-10-CM | POA: Diagnosis not present

## 2018-11-15 DIAGNOSIS — S32029D Unspecified fracture of second lumbar vertebra, subsequent encounter for fracture with routine healing: Secondary | ICD-10-CM | POA: Diagnosis not present

## 2018-11-15 DIAGNOSIS — N816 Rectocele: Secondary | ICD-10-CM

## 2018-11-15 DIAGNOSIS — S81801D Unspecified open wound, right lower leg, subsequent encounter: Secondary | ICD-10-CM | POA: Diagnosis not present

## 2018-11-15 DIAGNOSIS — S32039D Unspecified fracture of third lumbar vertebra, subsequent encounter for fracture with routine healing: Secondary | ICD-10-CM | POA: Diagnosis not present

## 2018-11-15 DIAGNOSIS — S01511D Laceration without foreign body of lip, subsequent encounter: Secondary | ICD-10-CM | POA: Diagnosis not present

## 2018-11-15 NOTE — Telephone Encounter (Signed)
Signed form and placed in completed forms bin for medical records.

## 2018-11-15 NOTE — Patient Instructions (Signed)

## 2018-11-15 NOTE — Telephone Encounter (Signed)
Judson Roch or Lattie Haw please place this at the front desk for pick-up and inform patients daughter   Thanks

## 2018-11-16 ENCOUNTER — Telehealth: Payer: Self-pay

## 2018-11-16 DIAGNOSIS — S32019D Unspecified fracture of first lumbar vertebra, subsequent encounter for fracture with routine healing: Secondary | ICD-10-CM | POA: Diagnosis not present

## 2018-11-16 DIAGNOSIS — S81801D Unspecified open wound, right lower leg, subsequent encounter: Secondary | ICD-10-CM | POA: Diagnosis not present

## 2018-11-16 DIAGNOSIS — S32039D Unspecified fracture of third lumbar vertebra, subsequent encounter for fracture with routine healing: Secondary | ICD-10-CM | POA: Diagnosis not present

## 2018-11-16 DIAGNOSIS — S22060D Wedge compression fracture of T7-T8 vertebra, subsequent encounter for fracture with routine healing: Secondary | ICD-10-CM | POA: Diagnosis not present

## 2018-11-16 DIAGNOSIS — S32591D Other specified fracture of right pubis, subsequent encounter for fracture with routine healing: Secondary | ICD-10-CM | POA: Diagnosis not present

## 2018-11-16 DIAGNOSIS — S01511D Laceration without foreign body of lip, subsequent encounter: Secondary | ICD-10-CM | POA: Diagnosis not present

## 2018-11-16 DIAGNOSIS — S32119D Unspecified Zone I fracture of sacrum, subsequent encounter for fracture with routine healing: Secondary | ICD-10-CM | POA: Diagnosis not present

## 2018-11-16 DIAGNOSIS — S32029D Unspecified fracture of second lumbar vertebra, subsequent encounter for fracture with routine healing: Secondary | ICD-10-CM | POA: Diagnosis not present

## 2018-11-16 DIAGNOSIS — S301XXD Contusion of abdominal wall, subsequent encounter: Secondary | ICD-10-CM | POA: Diagnosis not present

## 2018-11-16 NOTE — Telephone Encounter (Signed)
[facility] Nurse for patient called just to make sure she wasn't going to retake blood from patient if we already had done labs on her

## 2018-11-19 DIAGNOSIS — N959 Unspecified menopausal and perimenopausal disorder: Secondary | ICD-10-CM | POA: Diagnosis not present

## 2018-11-19 DIAGNOSIS — R5383 Other fatigue: Secondary | ICD-10-CM | POA: Diagnosis not present

## 2018-11-20 DIAGNOSIS — S01511D Laceration without foreign body of lip, subsequent encounter: Secondary | ICD-10-CM | POA: Diagnosis not present

## 2018-11-20 DIAGNOSIS — S32591D Other specified fracture of right pubis, subsequent encounter for fracture with routine healing: Secondary | ICD-10-CM | POA: Diagnosis not present

## 2018-11-20 DIAGNOSIS — S301XXD Contusion of abdominal wall, subsequent encounter: Secondary | ICD-10-CM | POA: Diagnosis not present

## 2018-11-20 DIAGNOSIS — S32039D Unspecified fracture of third lumbar vertebra, subsequent encounter for fracture with routine healing: Secondary | ICD-10-CM | POA: Diagnosis not present

## 2018-11-20 DIAGNOSIS — S81801D Unspecified open wound, right lower leg, subsequent encounter: Secondary | ICD-10-CM | POA: Diagnosis not present

## 2018-11-20 DIAGNOSIS — S32029D Unspecified fracture of second lumbar vertebra, subsequent encounter for fracture with routine healing: Secondary | ICD-10-CM | POA: Diagnosis not present

## 2018-11-20 DIAGNOSIS — S32119D Unspecified Zone I fracture of sacrum, subsequent encounter for fracture with routine healing: Secondary | ICD-10-CM | POA: Diagnosis not present

## 2018-11-20 DIAGNOSIS — S32019D Unspecified fracture of first lumbar vertebra, subsequent encounter for fracture with routine healing: Secondary | ICD-10-CM | POA: Diagnosis not present

## 2018-11-20 DIAGNOSIS — S22060D Wedge compression fracture of T7-T8 vertebra, subsequent encounter for fracture with routine healing: Secondary | ICD-10-CM | POA: Diagnosis not present

## 2018-11-21 ENCOUNTER — Telehealth: Payer: Self-pay | Admitting: *Deleted

## 2018-11-21 DIAGNOSIS — R739 Hyperglycemia, unspecified: Secondary | ICD-10-CM

## 2018-11-21 DIAGNOSIS — S32019D Unspecified fracture of first lumbar vertebra, subsequent encounter for fracture with routine healing: Secondary | ICD-10-CM | POA: Diagnosis not present

## 2018-11-21 DIAGNOSIS — R748 Abnormal levels of other serum enzymes: Secondary | ICD-10-CM

## 2018-11-21 DIAGNOSIS — S81801D Unspecified open wound, right lower leg, subsequent encounter: Secondary | ICD-10-CM | POA: Diagnosis not present

## 2018-11-21 DIAGNOSIS — S01511D Laceration without foreign body of lip, subsequent encounter: Secondary | ICD-10-CM | POA: Diagnosis not present

## 2018-11-21 DIAGNOSIS — S32591D Other specified fracture of right pubis, subsequent encounter for fracture with routine healing: Secondary | ICD-10-CM | POA: Diagnosis not present

## 2018-11-21 DIAGNOSIS — S301XXD Contusion of abdominal wall, subsequent encounter: Secondary | ICD-10-CM | POA: Diagnosis not present

## 2018-11-21 DIAGNOSIS — S32039D Unspecified fracture of third lumbar vertebra, subsequent encounter for fracture with routine healing: Secondary | ICD-10-CM | POA: Diagnosis not present

## 2018-11-21 DIAGNOSIS — S32029D Unspecified fracture of second lumbar vertebra, subsequent encounter for fracture with routine healing: Secondary | ICD-10-CM | POA: Diagnosis not present

## 2018-11-21 DIAGNOSIS — S22060D Wedge compression fracture of T7-T8 vertebra, subsequent encounter for fracture with routine healing: Secondary | ICD-10-CM | POA: Diagnosis not present

## 2018-11-21 DIAGNOSIS — S32119D Unspecified Zone I fracture of sacrum, subsequent encounter for fracture with routine healing: Secondary | ICD-10-CM | POA: Diagnosis not present

## 2018-11-21 NOTE — Telephone Encounter (Signed)
Diane with [facility] called and stated that the hospital requested a repeat on patient's CBC. Nurse stated that patient does not eat good. Stated that an Alternative Provider has prescribed 15 different supplement to "cure" her dementia and told her to eat Vegan. Amino Acid is one of the supplements.  Nurse stated that patient is not getting enough Protein and it is causing slow wound healing process.   Patient has an appointment on 12/06/2018 to see you. Please Advise.

## 2018-11-21 NOTE — Telephone Encounter (Signed)
Called and spoke with nurse, Diane and she stated that she cannot draw patient's blood due to a tube shortage and she has no tubes to draw blood, so patient needs to come into office to have done. Asked me to call patient to schedule an appointment.   Tried calling patient/caregiver (Yolanda Robbins) and LMOM to return call. Awaiting callback.

## 2018-11-21 NOTE — Telephone Encounter (Signed)
Let's do a cbc with diff and a prealbumin on her.  [facility] nurse may draw labs at home.  I will see patient as scheduled.

## 2018-11-22 ENCOUNTER — Other Ambulatory Visit: Payer: Medicare HMO

## 2018-11-22 ENCOUNTER — Other Ambulatory Visit: Payer: Self-pay

## 2018-11-22 DIAGNOSIS — R748 Abnormal levels of other serum enzymes: Secondary | ICD-10-CM | POA: Diagnosis not present

## 2018-11-22 DIAGNOSIS — R739 Hyperglycemia, unspecified: Secondary | ICD-10-CM | POA: Diagnosis not present

## 2018-11-22 NOTE — Telephone Encounter (Signed)
Patient is scheduled for an appointment 11/22/2018

## 2018-11-23 DIAGNOSIS — S81801D Unspecified open wound, right lower leg, subsequent encounter: Secondary | ICD-10-CM | POA: Diagnosis not present

## 2018-11-23 DIAGNOSIS — S32039D Unspecified fracture of third lumbar vertebra, subsequent encounter for fracture with routine healing: Secondary | ICD-10-CM | POA: Diagnosis not present

## 2018-11-23 DIAGNOSIS — S22060D Wedge compression fracture of T7-T8 vertebra, subsequent encounter for fracture with routine healing: Secondary | ICD-10-CM | POA: Diagnosis not present

## 2018-11-23 DIAGNOSIS — S01511D Laceration without foreign body of lip, subsequent encounter: Secondary | ICD-10-CM | POA: Diagnosis not present

## 2018-11-23 DIAGNOSIS — S32591D Other specified fracture of right pubis, subsequent encounter for fracture with routine healing: Secondary | ICD-10-CM | POA: Diagnosis not present

## 2018-11-23 DIAGNOSIS — S32029D Unspecified fracture of second lumbar vertebra, subsequent encounter for fracture with routine healing: Secondary | ICD-10-CM | POA: Diagnosis not present

## 2018-11-23 DIAGNOSIS — S32019D Unspecified fracture of first lumbar vertebra, subsequent encounter for fracture with routine healing: Secondary | ICD-10-CM | POA: Diagnosis not present

## 2018-11-23 DIAGNOSIS — S301XXD Contusion of abdominal wall, subsequent encounter: Secondary | ICD-10-CM | POA: Diagnosis not present

## 2018-11-23 DIAGNOSIS — S32119D Unspecified Zone I fracture of sacrum, subsequent encounter for fracture with routine healing: Secondary | ICD-10-CM | POA: Diagnosis not present

## 2018-11-23 LAB — CBC WITH DIFFERENTIAL/PLATELET
Absolute Monocytes: 634 cells/uL (ref 200–950)
Basophils Absolute: 49 cells/uL (ref 0–200)
Basophils Relative: 0.8 %
Eosinophils Absolute: 159 cells/uL (ref 15–500)
Eosinophils Relative: 2.6 %
HCT: 37.5 % (ref 35.0–45.0)
Hemoglobin: 12.7 g/dL (ref 11.7–15.5)
Lymphs Abs: 1391 cells/uL (ref 850–3900)
MCH: 32.9 pg (ref 27.0–33.0)
MCHC: 33.9 g/dL (ref 32.0–36.0)
MCV: 97.2 fL (ref 80.0–100.0)
MPV: 11.8 fL (ref 7.5–12.5)
Monocytes Relative: 10.4 %
Neutro Abs: 3867 cells/uL (ref 1500–7800)
Neutrophils Relative %: 63.4 %
Platelets: 174 10*3/uL (ref 140–400)
RBC: 3.86 10*6/uL (ref 3.80–5.10)
RDW: 13.3 % (ref 11.0–15.0)
Total Lymphocyte: 22.8 %
WBC: 6.1 10*3/uL (ref 3.8–10.8)

## 2018-11-23 LAB — PREALBUMIN: Prealbumin: 16 mg/dL — ABNORMAL LOW (ref 17–34)

## 2018-11-27 DIAGNOSIS — S32019D Unspecified fracture of first lumbar vertebra, subsequent encounter for fracture with routine healing: Secondary | ICD-10-CM | POA: Diagnosis not present

## 2018-11-27 DIAGNOSIS — S32591D Other specified fracture of right pubis, subsequent encounter for fracture with routine healing: Secondary | ICD-10-CM | POA: Diagnosis not present

## 2018-11-27 DIAGNOSIS — S32039D Unspecified fracture of third lumbar vertebra, subsequent encounter for fracture with routine healing: Secondary | ICD-10-CM | POA: Diagnosis not present

## 2018-11-27 DIAGNOSIS — S301XXD Contusion of abdominal wall, subsequent encounter: Secondary | ICD-10-CM | POA: Diagnosis not present

## 2018-11-27 DIAGNOSIS — S81801D Unspecified open wound, right lower leg, subsequent encounter: Secondary | ICD-10-CM | POA: Diagnosis not present

## 2018-11-27 DIAGNOSIS — S329XXD Fracture of unspecified parts of lumbosacral spine and pelvis, subsequent encounter for fracture with routine healing: Secondary | ICD-10-CM | POA: Diagnosis not present

## 2018-11-27 DIAGNOSIS — S22060D Wedge compression fracture of T7-T8 vertebra, subsequent encounter for fracture with routine healing: Secondary | ICD-10-CM | POA: Diagnosis not present

## 2018-11-27 DIAGNOSIS — S01511D Laceration without foreign body of lip, subsequent encounter: Secondary | ICD-10-CM | POA: Diagnosis not present

## 2018-11-27 DIAGNOSIS — S32119D Unspecified Zone I fracture of sacrum, subsequent encounter for fracture with routine healing: Secondary | ICD-10-CM | POA: Diagnosis not present

## 2018-11-27 DIAGNOSIS — S32029D Unspecified fracture of second lumbar vertebra, subsequent encounter for fracture with routine healing: Secondary | ICD-10-CM | POA: Diagnosis not present

## 2018-12-03 ENCOUNTER — Ambulatory Visit (INDEPENDENT_AMBULATORY_CARE_PROVIDER_SITE_OTHER): Payer: Medicare HMO | Admitting: Internal Medicine

## 2018-12-03 ENCOUNTER — Other Ambulatory Visit: Payer: Self-pay

## 2018-12-03 ENCOUNTER — Encounter: Payer: Self-pay | Admitting: Internal Medicine

## 2018-12-03 VITALS — BP 120/60 | HR 80 | Temp 97.6°F | Ht 67.0 in | Wt 131.0 lb

## 2018-12-03 DIAGNOSIS — S32009S Unspecified fracture of unspecified lumbar vertebra, sequela: Secondary | ICD-10-CM

## 2018-12-03 DIAGNOSIS — E441 Mild protein-calorie malnutrition: Secondary | ICD-10-CM | POA: Diagnosis not present

## 2018-12-03 DIAGNOSIS — G3184 Mild cognitive impairment, so stated: Secondary | ICD-10-CM | POA: Diagnosis not present

## 2018-12-03 DIAGNOSIS — R748 Abnormal levels of other serum enzymes: Secondary | ICD-10-CM | POA: Diagnosis not present

## 2018-12-03 DIAGNOSIS — E890 Postprocedural hypothyroidism: Secondary | ICD-10-CM | POA: Diagnosis not present

## 2018-12-03 DIAGNOSIS — R739 Hyperglycemia, unspecified: Secondary | ICD-10-CM

## 2018-12-03 DIAGNOSIS — S3282XS Multiple fractures of pelvis without disruption of pelvic ring, sequela: Secondary | ICD-10-CM

## 2018-12-03 DIAGNOSIS — M4004 Postural kyphosis, thoracic region: Secondary | ICD-10-CM | POA: Diagnosis not present

## 2018-12-03 DIAGNOSIS — M810 Age-related osteoporosis without current pathological fracture: Secondary | ICD-10-CM | POA: Diagnosis not present
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
## 2018-12-04 ENCOUNTER — Encounter: Payer: Self-pay | Admitting: Internal Medicine

## 2018-12-04 LAB — HEPATIC FUNCTION PANEL
AG Ratio: 1.4 (calc) (ref 1.0–2.5)
ALT: 10 U/L (ref 6–29)
AST: 17 U/L (ref 10–35)
Albumin: 3.8 g/dL (ref 3.6–5.1)
Alkaline phosphatase (APISO): 125 U/L (ref 37–153)
Bilirubin, Direct: 0.1 mg/dL (ref 0.0–0.2)
Globulin: 2.8 g/dL (calc) (ref 1.9–3.7)
Indirect Bilirubin: 0.3 mg/dL (calc) (ref 0.2–1.2)
Total Bilirubin: 0.4 mg/dL (ref 0.2–1.2)
Total Protein: 6.6 g/dL (ref 6.1–8.1)

## 2018-12-04 NOTE — Addendum Note (Signed)
Addended by: Gayland Curry on: 12/04/2018 03:59 PM   Modules accepted: Orders

## 2018-12-05 DIAGNOSIS — S32591D Other specified fracture of right pubis, subsequent encounter for fracture with routine healing: Secondary | ICD-10-CM | POA: Diagnosis not present

## 2018-12-05 DIAGNOSIS — S32029D Unspecified fracture of second lumbar vertebra, subsequent encounter for fracture with routine healing: Secondary | ICD-10-CM | POA: Diagnosis not present

## 2018-12-05 DIAGNOSIS — S301XXD Contusion of abdominal wall, subsequent encounter: Secondary | ICD-10-CM | POA: Diagnosis not present

## 2018-12-05 DIAGNOSIS — S32039D Unspecified fracture of third lumbar vertebra, subsequent encounter for fracture with routine healing: Secondary | ICD-10-CM | POA: Diagnosis not present

## 2018-12-05 DIAGNOSIS — S32019D Unspecified fracture of first lumbar vertebra, subsequent encounter for fracture with routine healing: Secondary | ICD-10-CM | POA: Diagnosis not present

## 2018-12-05 DIAGNOSIS — S22060D Wedge compression fracture of T7-T8 vertebra, subsequent encounter for fracture with routine healing: Secondary | ICD-10-CM | POA: Diagnosis not present

## 2018-12-05 DIAGNOSIS — S32119D Unspecified Zone I fracture of sacrum, subsequent encounter for fracture with routine healing: Secondary | ICD-10-CM | POA: Diagnosis not present

## 2018-12-05 DIAGNOSIS — S81801D Unspecified open wound, right lower leg, subsequent encounter: Secondary | ICD-10-CM | POA: Diagnosis not present

## 2018-12-05 DIAGNOSIS — S01511D Laceration without foreign body of lip, subsequent encounter: Secondary | ICD-10-CM | POA: Diagnosis not present

## 2018-12-06 ENCOUNTER — Ambulatory Visit: Payer: Medicare HMO | Admitting: Internal Medicine

## 2018-12-10 DIAGNOSIS — S32019D Unspecified fracture of first lumbar vertebra, subsequent encounter for fracture with routine healing: Secondary | ICD-10-CM | POA: Diagnosis not present

## 2018-12-10 DIAGNOSIS — S32039D Unspecified fracture of third lumbar vertebra, subsequent encounter for fracture with routine healing: Secondary | ICD-10-CM | POA: Diagnosis not present

## 2018-12-10 DIAGNOSIS — S32119D Unspecified Zone I fracture of sacrum, subsequent encounter for fracture with routine healing: Secondary | ICD-10-CM | POA: Diagnosis not present

## 2018-12-10 DIAGNOSIS — S01511D Laceration without foreign body of lip, subsequent encounter: Secondary | ICD-10-CM | POA: Diagnosis not present

## 2018-12-10 DIAGNOSIS — S301XXD Contusion of abdominal wall, subsequent encounter: Secondary | ICD-10-CM | POA: Diagnosis not present

## 2018-12-10 DIAGNOSIS — S81801D Unspecified open wound, right lower leg, subsequent encounter: Secondary | ICD-10-CM | POA: Diagnosis not present

## 2018-12-10 DIAGNOSIS — S32591D Other specified fracture of right pubis, subsequent encounter for fracture with routine healing: Secondary | ICD-10-CM | POA: Diagnosis not present

## 2018-12-10 DIAGNOSIS — S22060D Wedge compression fracture of T7-T8 vertebra, subsequent encounter for fracture with routine healing: Secondary | ICD-10-CM | POA: Diagnosis not present

## 2018-12-10 DIAGNOSIS — S32029D Unspecified fracture of second lumbar vertebra, subsequent encounter for fracture with routine healing: Secondary | ICD-10-CM | POA: Diagnosis not present

## 2018-12-11 ENCOUNTER — Other Ambulatory Visit: Payer: Self-pay

## 2018-12-13 ENCOUNTER — Ambulatory Visit: Payer: Medicare HMO | Admitting: Obstetrics and Gynecology

## 2018-12-13 ENCOUNTER — Ambulatory Visit (INDEPENDENT_AMBULATORY_CARE_PROVIDER_SITE_OTHER): Payer: Medicare HMO | Admitting: Obstetrics and Gynecology

## 2018-12-13 ENCOUNTER — Other Ambulatory Visit: Payer: Self-pay

## 2018-12-13 ENCOUNTER — Encounter: Payer: Self-pay | Admitting: Obstetrics and Gynecology

## 2018-12-13 VITALS — BP 128/68 | HR 70 | Temp 96.6°F | Ht 67.75 in | Wt 128.8 lb

## 2018-12-13 DIAGNOSIS — N816 Rectocele: Secondary | ICD-10-CM

## 2018-12-13 DIAGNOSIS — N3281 Overactive bladder: Secondary | ICD-10-CM | POA: Diagnosis not present

## 2018-12-13 DIAGNOSIS — Z4689 Encounter for fitting and adjustment of other specified devices: Secondary | ICD-10-CM
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
## 2018-12-13 NOTE — Patient Instructions (Signed)
Mirabegron extended-release tablets What is this medicine? MIRABEGRON (MIR a BEG ron) is used to treat overactive bladder. This medicine reduces the amount of bathroom visits. It may also help to control wetting accidents. It may be used alone, but sometimes may be given with other treatments. This medicine may be used for other purposes; ask your health care provider or pharmacist if you have questions. COMMON BRAND NAME(S): Myrbetriq What should I tell my health care provider before I take this medicine? They need to know if you have any of these conditions:  high blood pressure  kidney disease  liver disease  problems urinating  prostate disease  an unusual or allergic reaction to mirabegron, other medicines, foods, dyes, or preservatives  pregnant or trying to get pregnant  breast-feeding How should I use this medicine? Take this medicine by mouth with a glass of water. Follow the directions on the prescription label. Do not cut, crush or chew this medicine. You can take it with or without food. If it upsets your stomach, take it with food. Take your medicine at regular intervals. Do not take it more often than directed. Do not stop taking except on your doctor's advice. Talk to your pediatrician regarding the use of this medicine in children. Special care may be needed. Overdosage: If you think you have taken too much of this medicine contact a poison control center or emergency room at once. NOTE: This medicine is only for you. Do not share this medicine with others. What if I miss a dose? If you miss a dose, take it as soon as you can. If it is almost time for your next dose, take only that dose. Do not take double or extra doses. What may interact with this medicine?  codeine  desipramine  digoxin  flecainide  MAOIs like Carbex, Eldepryl, Marplan, Nardil, and Parnate  methadone  metoprolol  pimozide  propafenone  thioridazine  warfarin This list may not  describe all possible interactions. Give your health care provider a list of all the medicines, herbs, non-prescription drugs, or dietary supplements you use. Also tell them if you smoke, drink alcohol, or use illegal drugs. Some items may interact with your medicine. What should I watch for while using this medicine? Visit your doctor or health care professional for regular checks on your progress. Check your blood pressure as directed. Ask your doctor or health care professional what your blood pressure should be and when you should contact him or her. You may need to limit your intake of tea, coffee, caffeinated sodas, or alcohol. These drinks may make your symptoms worse. What side effects may I notice from receiving this medicine? Side effects that you should report to your doctor or health care professional as soon as possible:  allergic reactions like skin rash, itching or hives, swelling of the face, lips, or tongue  high blood pressure  fast, irregular heartbeat  redness, blistering, peeling or loosening of the skin, including inside the mouth  signs of infection like fever or chills; pain or difficulty passing urine  trouble passing urine or change in the amount of urine Side effects that usually do not require medical attention (report to your doctor or health care professional if they continue or are bothersome):  constipation  dry mouth  headache  runny nose  stomach upset This list may not describe all possible side effects. Call your doctor for medical advice about side effects. You may report side effects to FDA at 1-800-FDA-1088. Where should   I keep my medicine? Keep out of the reach of children. Store at room temperature between 15 and 30 degrees C (59 and 86 degrees F). Throw away any unused medicine after the expiration date. NOTE: This sheet is a summary. It may not cover all possible information. If you have questions about this medicine, talk to your doctor,  pharmacist, or health care provider.  2020 Elsevier/Gold Standard (2016-06-09 11:33:21)  

## 2018-12-14 DIAGNOSIS — S32591D Other specified fracture of right pubis, subsequent encounter for fracture with routine healing: Secondary | ICD-10-CM | POA: Diagnosis not present

## 2018-12-14 DIAGNOSIS — S01511D Laceration without foreign body of lip, subsequent encounter: Secondary | ICD-10-CM | POA: Diagnosis not present

## 2018-12-14 DIAGNOSIS — S22060D Wedge compression fracture of T7-T8 vertebra, subsequent encounter for fracture with routine healing: Secondary | ICD-10-CM | POA: Diagnosis not present

## 2018-12-14 DIAGNOSIS — S32119D Unspecified Zone I fracture of sacrum, subsequent encounter for fracture with routine healing: Secondary | ICD-10-CM | POA: Diagnosis not present

## 2018-12-14 DIAGNOSIS — S32029D Unspecified fracture of second lumbar vertebra, subsequent encounter for fracture with routine healing: Secondary | ICD-10-CM | POA: Diagnosis not present

## 2018-12-14 DIAGNOSIS — S32019D Unspecified fracture of first lumbar vertebra, subsequent encounter for fracture with routine healing: Secondary | ICD-10-CM | POA: Diagnosis not present

## 2018-12-14 DIAGNOSIS — S301XXD Contusion of abdominal wall, subsequent encounter: Secondary | ICD-10-CM | POA: Diagnosis not present

## 2018-12-14 DIAGNOSIS — S81801D Unspecified open wound, right lower leg, subsequent encounter: Secondary | ICD-10-CM | POA: Diagnosis not present

## 2018-12-14 DIAGNOSIS — S32039D Unspecified fracture of third lumbar vertebra, subsequent encounter for fracture with routine healing: Secondary | ICD-10-CM | POA: Diagnosis not present

## 2018-12-18 DIAGNOSIS — S81801D Unspecified open wound, right lower leg, subsequent encounter: Secondary | ICD-10-CM | POA: Diagnosis not present

## 2018-12-18 DIAGNOSIS — S32019D Unspecified fracture of first lumbar vertebra, subsequent encounter for fracture with routine healing: Secondary | ICD-10-CM | POA: Diagnosis not present

## 2018-12-18 DIAGNOSIS — S22060D Wedge compression fracture of T7-T8 vertebra, subsequent encounter for fracture with routine healing: Secondary | ICD-10-CM | POA: Diagnosis not present

## 2018-12-18 DIAGNOSIS — S32039D Unspecified fracture of third lumbar vertebra, subsequent encounter for fracture with routine healing: Secondary | ICD-10-CM | POA: Diagnosis not present

## 2018-12-18 DIAGNOSIS — S01511D Laceration without foreign body of lip, subsequent encounter: Secondary | ICD-10-CM | POA: Diagnosis not present

## 2018-12-18 DIAGNOSIS — S32591D Other specified fracture of right pubis, subsequent encounter for fracture with routine healing: Secondary | ICD-10-CM | POA: Diagnosis not present

## 2018-12-18 DIAGNOSIS — S301XXD Contusion of abdominal wall, subsequent encounter: Secondary | ICD-10-CM | POA: Diagnosis not present

## 2018-12-18 DIAGNOSIS — S32119D Unspecified Zone I fracture of sacrum, subsequent encounter for fracture with routine healing: Secondary | ICD-10-CM | POA: Diagnosis not present

## 2018-12-18 DIAGNOSIS — S32029D Unspecified fracture of second lumbar vertebra, subsequent encounter for fracture with routine healing: Secondary | ICD-10-CM | POA: Diagnosis not present

## 2018-12-20 ENCOUNTER — Other Ambulatory Visit: Payer: Self-pay

## 2018-12-20 ENCOUNTER — Ambulatory Visit: Payer: Medicare HMO | Attending: Internal Medicine | Admitting: Physical Therapy

## 2018-12-20 ENCOUNTER — Encounter: Payer: Self-pay | Admitting: Physical Therapy

## 2018-12-20 DIAGNOSIS — M4004 Postural kyphosis, thoracic region: Secondary | ICD-10-CM | POA: Insufficient documentation

## 2018-12-20 DIAGNOSIS — M6281 Muscle weakness (generalized): Secondary | ICD-10-CM | POA: Diagnosis not present

## 2018-12-20 DIAGNOSIS — R293 Abnormal posture: Secondary | ICD-10-CM | POA: Insufficient documentation

## 2018-12-20 DIAGNOSIS — R2689 Other abnormalities of gait and mobility: Secondary | ICD-10-CM | POA: Diagnosis not present
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
## 2018-12-20 NOTE — Patient Instructions (Signed)
Wall stretch, arms facing forward on wall, slide x 10, alternating leg forward for spine extension , shoulder flexion x 1 per day Bridging 10 x 2 x 2 per day  Lower trunk rotation x 10 x 2 per day

## 2018-12-25 DIAGNOSIS — E638 Other specified nutritional deficiencies: Secondary | ICD-10-CM | POA: Diagnosis not present

## 2018-12-25 DIAGNOSIS — T5691XD Toxic effect of unspecified metal, accidental (unintentional), subsequent encounter: Secondary | ICD-10-CM | POA: Diagnosis not present

## 2018-12-25 DIAGNOSIS — N959 Unspecified menopausal and perimenopausal disorder: Secondary | ICD-10-CM | POA: Diagnosis not present

## 2018-12-25 DIAGNOSIS — E279 Disorder of adrenal gland, unspecified: Secondary | ICD-10-CM | POA: Diagnosis not present

## 2018-12-25 DIAGNOSIS — Z8619 Personal history of other infectious and parasitic diseases: Secondary | ICD-10-CM | POA: Diagnosis not present

## 2018-12-25 DIAGNOSIS — B379 Candidiasis, unspecified: Secondary | ICD-10-CM | POA: Diagnosis not present

## 2018-12-25 DIAGNOSIS — R5383 Other fatigue: Secondary | ICD-10-CM | POA: Diagnosis not present

## 2018-12-25 DIAGNOSIS — E7210 Disorders of sulfur-bearing amino-acid metabolism, unspecified: Secondary | ICD-10-CM | POA: Diagnosis not present

## 2018-12-25 DIAGNOSIS — Z77120 Contact with and (suspected) exposure to mold (toxic): Secondary | ICD-10-CM | POA: Diagnosis not present

## 2018-12-25 DIAGNOSIS — R413 Other amnesia: Secondary | ICD-10-CM | POA: Diagnosis not present

## 2018-12-26 DIAGNOSIS — S301XXD Contusion of abdominal wall, subsequent encounter: Secondary | ICD-10-CM | POA: Diagnosis not present

## 2018-12-26 DIAGNOSIS — S32591D Other specified fracture of right pubis, subsequent encounter for fracture with routine healing: Secondary | ICD-10-CM | POA: Diagnosis not present

## 2018-12-26 DIAGNOSIS — S01511D Laceration without foreign body of lip, subsequent encounter: Secondary | ICD-10-CM | POA: Diagnosis not present

## 2018-12-26 DIAGNOSIS — S32039D Unspecified fracture of third lumbar vertebra, subsequent encounter for fracture with routine healing: Secondary | ICD-10-CM | POA: Diagnosis not present

## 2018-12-26 DIAGNOSIS — S32029D Unspecified fracture of second lumbar vertebra, subsequent encounter for fracture with routine healing: Secondary | ICD-10-CM | POA: Diagnosis not present

## 2018-12-26 DIAGNOSIS — S32119D Unspecified Zone I fracture of sacrum, subsequent encounter for fracture with routine healing: Secondary | ICD-10-CM | POA: Diagnosis not present

## 2018-12-26 DIAGNOSIS — S81801D Unspecified open wound, right lower leg, subsequent encounter: Secondary | ICD-10-CM | POA: Diagnosis not present

## 2018-12-26 DIAGNOSIS — S22060D Wedge compression fracture of T7-T8 vertebra, subsequent encounter for fracture with routine healing: Secondary | ICD-10-CM | POA: Diagnosis not present

## 2018-12-26 DIAGNOSIS — S32019D Unspecified fracture of first lumbar vertebra, subsequent encounter for fracture with routine healing: Secondary | ICD-10-CM | POA: Diagnosis not present

## 2018-12-31 ENCOUNTER — Ambulatory Visit: Payer: Medicare HMO | Admitting: Physical Therapy

## 2019-01-07 ENCOUNTER — Other Ambulatory Visit: Payer: Self-pay

## 2019-01-07 ENCOUNTER — Encounter: Payer: Self-pay | Admitting: Physical Therapy

## 2019-01-07 ENCOUNTER — Ambulatory Visit: Payer: Medicare HMO | Attending: Internal Medicine | Admitting: Physical Therapy

## 2019-01-07 DIAGNOSIS — M4004 Postural kyphosis, thoracic region: Secondary | ICD-10-CM

## 2019-01-07 DIAGNOSIS — M6281 Muscle weakness (generalized): Secondary | ICD-10-CM | POA: Diagnosis not present

## 2019-01-07 DIAGNOSIS — R293 Abnormal posture: Secondary | ICD-10-CM | POA: Diagnosis not present

## 2019-01-07 DIAGNOSIS — R2689 Other abnormalities of gait and mobility: Secondary | ICD-10-CM | POA: Diagnosis not present

## 2019-01-07 NOTE — Patient Instructions (Signed)
Access Code: [email or web address]  URL: [email or web address]  Date: 01/07/2019  Prepared by: Raeford Razor   Exercises  Supine Shoulder Horizontal Abduction with Resistance - 10 reps - 2 sets - 5 hold - 2x daily - 7x weekly  Supine Shoulder External Rotation with Resistance - 10 reps - 2 sets - 5 hold - 2x daily - 7x weekly    [facility] info, handout and posture ed.
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
## 2019-01-11 ENCOUNTER — Encounter: Payer: Self-pay | Admitting: Physical Therapy

## 2019-01-11 ENCOUNTER — Other Ambulatory Visit: Payer: Self-pay

## 2019-01-11 ENCOUNTER — Ambulatory Visit: Payer: Medicare HMO | Admitting: Physical Therapy

## 2019-01-11 DIAGNOSIS — R293 Abnormal posture: Secondary | ICD-10-CM | POA: Diagnosis not present

## 2019-01-11 DIAGNOSIS — M6281 Muscle weakness (generalized): Secondary | ICD-10-CM

## 2019-01-11 DIAGNOSIS — R2689 Other abnormalities of gait and mobility: Secondary | ICD-10-CM

## 2019-01-11 DIAGNOSIS — M4004 Postural kyphosis, thoracic region: Secondary | ICD-10-CM
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
## 2019-01-11 NOTE — Patient Instructions (Addendum)
   Abduction: Side Leg Lift (Eccentric) - Side-Lying    Lie on side. Lift top leg slightly higher than shoulder level. Keep top leg straight with body, toes pointing forward. Slowly lower for 3-5 seconds. ___ reps per set, ___ sets per day, ___ days per week. Add ___ lbs when you achieve ___ repetitions.  http://ecce.exer.us/63   Copyright  VHI. All rights reserved.    http://plyo.exer.us/172   Copyright  VHI. All rights reserved.  Abduction: Clam (Eccentric) - Side-Lying    Lie on side with knees bent. Lift top knee, keeping feet together. Keep trunk steady. Slowly lower for 3-5 seconds. ___ reps per set, ___ sets per day, ___ days per week. Add ___ lbs when you achieve ___ repetitions.  http://ecce.exer.us/65   Copyright  VHI. All rights reserved.

## 2019-01-14 ENCOUNTER — Ambulatory Visit: Payer: Medicare HMO | Admitting: Physical Therapy

## 2019-01-14 ENCOUNTER — Other Ambulatory Visit: Payer: Self-pay

## 2019-01-14 ENCOUNTER — Encounter: Payer: Self-pay | Admitting: Physical Therapy

## 2019-01-14 DIAGNOSIS — M4004 Postural kyphosis, thoracic region: Secondary | ICD-10-CM

## 2019-01-14 DIAGNOSIS — R293 Abnormal posture: Secondary | ICD-10-CM | POA: Diagnosis not present

## 2019-01-14 DIAGNOSIS — M6281 Muscle weakness (generalized): Secondary | ICD-10-CM

## 2019-01-14 DIAGNOSIS — R2689 Other abnormalities of gait and mobility: Secondary | ICD-10-CM | POA: Diagnosis not present
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
## 2019-01-17 ENCOUNTER — Ambulatory Visit: Payer: Medicare HMO | Admitting: Physical Therapy

## 2019-01-21 ENCOUNTER — Ambulatory Visit: Payer: Medicare HMO | Admitting: Physical Therapy

## 2019-01-23 ENCOUNTER — Encounter: Payer: Medicare HMO | Admitting: Physical Therapy

## 2019-01-28 ENCOUNTER — Encounter: Payer: Medicare HMO | Admitting: Physical Therapy

## 2019-01-30 ENCOUNTER — Telehealth: Payer: Self-pay

## 2019-01-30 ENCOUNTER — Encounter: Payer: Medicare HMO | Admitting: Physical Therapy

## 2019-01-30 ENCOUNTER — Encounter: Payer: Self-pay | Admitting: Family

## 2019-01-30 NOTE — Telephone Encounter (Signed)
Daughter called to reschedule appointment from 01/31/2019 to 02/10/2018 wants in office visit and couldn't make the first appointment

## 2019-01-31 ENCOUNTER — Encounter: Payer: Self-pay | Admitting: Nurse Practitioner

## 2019-02-04 ENCOUNTER — Ambulatory Visit: Payer: Medicare HMO | Admitting: Physical Therapy

## 2019-02-11 ENCOUNTER — Encounter: Payer: Medicare HMO | Admitting: Nurse Practitioner

## 2019-02-11 ENCOUNTER — Other Ambulatory Visit: Payer: Self-pay

## 2019-02-11 ENCOUNTER — Ambulatory Visit: Payer: Medicare HMO | Admitting: Physical Therapy

## 2019-02-11 NOTE — Progress Notes (Signed)
Error

## 2019-02-13 ENCOUNTER — Ambulatory Visit: Payer: Medicare HMO | Admitting: Physical Therapy

## 2019-02-13 ENCOUNTER — Encounter: Payer: Self-pay | Admitting: Nurse Practitioner

## 2019-02-13 ENCOUNTER — Other Ambulatory Visit: Payer: Self-pay

## 2019-02-13 ENCOUNTER — Ambulatory Visit (INDEPENDENT_AMBULATORY_CARE_PROVIDER_SITE_OTHER): Payer: Medicare HMO | Admitting: Nurse Practitioner

## 2019-02-13 DIAGNOSIS — Z0000 Encounter for general adult medical examination without abnormal findings: Secondary | ICD-10-CM | POA: Diagnosis not present

## 2019-02-13 NOTE — Progress Notes (Signed)
Subjective:   Yolanda Robbins is a 79 y.o. female who presents for Medicare Annual (Subsequent) preventive examination.  Review of Systems:   Cardiac Risk Factors include: advanced age (>90men, >79 women)     Objective:     Vitals: LMP 01/31/2006 (Approximate)   There is no height or weight on file to calculate BMI.  Advanced Directives 02/13/2019 12/20/2018 10/27/2018 10/26/2018 01/26/2018 06/01/2017 01/20/2017  Does Patient Have a Medical Advance Directive? No Yes No Yes No No No  Type of Advance Directive - [place];Living will - - - - -  Does patient want to make changes to medical advance directive? - No - Patient declined No - Guardian declined No - Guardian declined - - -  Copy of [place] in Chart? - - - - - - -  Would patient like information on creating a medical advance directive? Yes (MAU/Ambulatory/Procedural Areas - Information given) - No - Patient declined - Yes (MAU/Ambulatory/Procedural Areas - Information given) Yes (MAU/Ambulatory/Procedural Areas - Information given) Yes (ED - Information included in AVS)    Tobacco Social History   Tobacco Use  Smoking Status Never Smoker  Smokeless Tobacco Never Used     Counseling given: Not Answered   Clinical Intake:  Pre-visit preparation completed: Yes  Pain : No/denies pain     BMI - recorded: 19.73 Nutritional Status: BMI <19  Underweight Nutritional Risks: None Diabetes: No  How often do you need to have someone help you when you read instructions, pamphlets, or other written materials from your doctor or pharmacy?: 1 - Never What is the last grade level you completed in school?: 1 year of college  Interpreter Needed?: No     Past Medical History:  Diagnosis Date  . Anxiety   . Cancer [facility])    Breast/ bilateral mastectomy  . Macular degeneration (senile) of retina   . 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 Osteoporosis    Past  Surgical History:  Procedure Laterality Date  . BREAST SURGERY    . FOOT SURGERY    . MASTECTOMY    . NASAL SEPTUM SURGERY    . OOPHORECTOMY     Bil.oophorectomy  . TONSILLECTOMY AND ADENOIDECTOMY    . TUBAL LIGATION     Family History  Adopted: Yes  Problem Relation Age of Onset  . Osteoporosis Mother   . Stroke Mother   . Colon cancer Neg Hx    Social History   Socioeconomic History  . Marital status: Widowed    Spouse name: Not on file  . Number of children: Not on file  . Years of education: Not on file  . Highest education level: Not on file  Occupational History  . Not on file  Tobacco Use  . Smoking status: Never Smoker  . Smokeless tobacco: Never Used  Substance and Sexual Activity  . Alcohol use: No  . Drug use: No  . Sexual activity: Never    Birth control/protection: Post-menopausal, Surgical    Comment: Tubal ligation  Other Topics Concern  . Not on file  Social History Narrative  . Not on file   Social Determinants of Health   Financial Resource Strain:   . Difficulty of Paying Living Expenses: Not on file  Food Insecurity:   . Worried About Charity fundraiser in the Last Year: Not on file  . Ran Out of Food in the Last Year: Not on file  Transportation Needs:   .  Lack of Transportation (Medical): Not on file  . Lack of Transportation (Non-Medical): Not on file  Physical Activity:   . Days of Exercise per Week: Not on file  . Minutes of Exercise per Session: Not on file  Stress:   . Feeling of Stress : Not on file  Social Connections:   . Frequency of Communication with Friends and Family: Not on file  . Frequency of Social Gatherings with Friends and Family: Not on file  . Attends Religious Services: Not on file  . Active Member of Clubs or Organizations: Not on file  . Attends Archivist Meetings: Not on file  . Marital Status: Not on file    Outpatient Encounter Medications as of 02/13/2019  Medication Sig  . ADK  5000-5000-500 UNIT-MCG CAPS Take 1 tablet by mouth daily.   . Ascorbic Acid (VITAMIN C) 1000 MG tablet Take 1,000 mg by mouth daily.  . ASHWAGANDHA PO Take 2 capsules by mouth daily.  . ASTAXANTHIN PO Take 1 capsule by mouth daily.   [name] b complex vitamins tablet Take 1 tablet by mouth daily.  . Boron 3 MG CAPS Take 1 capsule by mouth daily.  . Calcium Carb-Cholecalciferol (CALCIUM 1000 + D PO) Take 1 tablet by mouth daily.   . Cholecalciferol (VITAMIN D-3 PO) Take 2,500 Units by mouth daily. FROM ORGANIC LICHEN  . cycloSPORINE (RESTASIS) 0.05 % ophthalmic emulsion Place 1 drop into both eyes 2 (two) times daily.  . Iodine Strong, Lug[place], (IODINE STRONG PO) Take 1 drop by mouth daily.   . Lutein 20 MG CAPS Take 1 tablet by mouth daily.  . Melatonin 3 MG TABS Take 1 tablet (3 mg total) by mouth at bedtime.  . Misc Natural Products (DETOX) CAPS Take 1 capsule by mouth daily.  . Multiple Vitamins-Minerals (EYE HEALTH PO) Take 1 capsule by mouth 2 (two) times daily.  . Nutritional Supplements (DHEA PO) Take 500 mcg by mouth daily.  Ernestine Conrad 3-6-9 Fatty Acids (SM OMEGA-3 PO) Take 1 tablet by mouth 2 (two) times daily.  [name] OVER THE COUNTER MEDICATION Take 5 tablets by mouth daily. MASTER, AMINO ACID 1,000  . progesterone (PROMETRIUM) 100 MG capsule Take 100 mg by mouth daily.  . Taurine 500 MG CAPS Take 500 mg by mouth.  . TURMERIC PO Take by mouth. Takes 2000mg  daily  . UNABLE TO FIND Med Name: Lion mane 3 capsules daily for mood stabilization  . UNABLE TO FIND Med Name: Transfer Factor-immune support, 3 by mouth daily  . ferrous sulfate 325 (65 FE) MG tablet Take 325 mg by mouth daily with breakfast.  . NON FORMULARY Takes CBD oil  . Nutritional Supplements (JUICE PLUS FIBRE PO) Take 2 tablets by mouth daily. ORCHARD AND OMEGA JUICE PLUS IN THE MORNING, also taking 2 veggies in the evening   No facility-administered encounter medications on file as of 02/13/2019.    Activities of Daily Living In  your present state of health, do you have any difficulty performing the following activities: 02/13/2019 10/27/2018  Hearing? N N  Vision? N N  Difficulty concentrating or making decisions? Y N  Walking or climbing stairs? N Y  Dressing or bathing? N N  Doing errands, shopping? N Y  Conservation officer, nature and eating ? N -  Using the Toilet? N -  In the past six months, have you accidently leaked urine? Y -  Do you have problems with loss of bowel control? N -  Managing your Medications?  Y -  Comment daughter helps -  Managing your Finances? N -  Comment daughter helps -  Housekeeping or managing your Housekeeping? N -  Some recent data might be hidden    Patient Care Team: Gayland Curry, DO as PCP - General (Geriatric Medicine)    Assessment:   This is a routine wellness examination for [facility].  Exercise Activities and Dietary recommendations Current Exercise Habits: The patient does not participate in regular exercise at present  Goals    . <enter goal here>     Starting 01/18/16, I will maintain my current exercise routine and I will attempt to work on my posture.     . Increase physical activity     To increase walking to 6 times a week.        Fall Risk Fall Risk  02/13/2019 12/03/2018 11/02/2018 06/04/2018 01/26/2018  Falls in the past year? 0 0 0 0 1  Number falls in past yr: 0 0 0 0 1  Comment - - - - -  Injury with Fall? 0 - 0 0 0  Follow up - - - - -   Is the patient's home free of loose throw rugs in walkways, pet beds, electrical cords, etc?   yes      Grab bars in the bathroom? no      Handrails on the stairs?   yes      Adequate lighting?   yes  Timed Get Up and Go performed: na  Depression Screen PHQ 2/9 Scores 02/13/2019 01/26/2018 12/04/2017 06/01/2017  PHQ - 2 Score 0 0 0 0     Cognitive Function MMSE - Mini Mental State Exam 01/26/2018 01/20/2017 01/18/2016 09/21/2015  Not completed: Refused - (No Data) -  Orientation to time 5 5 - 5  Orientation to Place 5  5 - 4  Registration 3 3 - 3  Attention/ Calculation 5 5 - 5  Recall 2 2 - 2  Language- name 2 objects 2 2 - 2  Language- repeat 1 1 - 1  Language- follow 3 step command 3 3 - 2  Language- read & follow direction 1 1 - 1  Write a sentence 1 1 - 1  Copy design 0 1 - 1  Total score 28 29 - 27   Montreal Cognitive Assessment  12/04/2015  Visuospatial/ Executive (0/5) 4  Naming (0/3) 3  Attention: Read list of digits (0/2) 2  Attention: Read list of letters (0/1) 1  Attention: Serial 7 subtraction starting at 100 (0/3) 3  Language: Repeat phrase (0/2) 2  Language : Fluency (0/1) 1  Abstraction (0/2) 2  Delayed Recall (0/5) 4  Orientation (0/6) 6  Total 28   6CIT Screen 02/13/2019  What Year? 0 points  What month? 0 points  What time? 0 points  Count back from 20 2 points  Months in reverse 0 points  Repeat phrase 6 points  Total Score 8    Immunization History  Administered Date(s) Administered  . Pneumococcal Conjugate-13 01/31/2013  . Pneumococcal Polysaccharide-23 02/01/2007  . Tdap 02/01/2012, 10/27/2018  . Zoster Recombinat (Shingrix) 01/25/2018, 08/22/2018    Qualifies for Shingles Vaccine? Yes, completed  Screening Tests Health Maintenance  Topic Date Due  . TETANUS/TDAP  10/26/2028  . DEXA SCAN  Completed  . PNA vac Low Risk Adult  Completed    Cancer Screenings: Lung: Low Dose CT Chest recommended if Age 73-80 years, 30 pack-year currently smoking OR have quit w/in 15years.  Patient does not qualify. Breast:  Up to date on Mammogram? Yes   Up to date of Bone Density/Dexa? Yes Colorectal: aged out  Additional Screenings:  Hepatitis C Screening: na     Plan:      I have personally reviewed and noted the following in the patient's chart:   . Medical and social history . Use of alcohol, tobacco or illicit drugs  . Current medications and supplements . Functional ability and status . Nutritional status . Physical activity . Advanced directives .  List of other physicians . Hospitalizations, surgeries, and ER visits in previous 12 months . Vitals . Screenings to include cognitive, depression, and falls . Referrals and appointments  In addition, I have reviewed and discussed with patient certain preventive protocols, quality metrics, and best practice recommendations. A written personalized care plan for preventive services as well as general preventive health recommendations were provided to patient.     Lauree Chandler, NP  02/13/2019

## 2019-02-13 NOTE — Progress Notes (Signed)
This encounter was created in error - please disregard.

## 2019-02-13 NOTE — Progress Notes (Signed)
   This service is provided via telemedicine  No vital signs collected/recorded due to the encounter was a telemedicine visit.   Location of patient (ex: home, work):  [place] in [place], [place]   Patient consents to a telephone visit: Yes  Location of the provider (ex: office, home):  [employer], Office   Name of any referring provider:  Gayland Curry, DO  Names of all persons participating in the telemedicine service and their role in the encounter:  S.Chrae B/CMA, Sherrie Mustache, NP, and Patient   Time spent on call:  7 min with medical assistant

## 2019-02-13 NOTE — Patient Instructions (Signed)
[name] , Thank you for taking time to come for your Medicare Wellness Visit. I appreciate your ongoing commitment to your health goals. Please review the following plan we discussed and let me know if I can assist you in the future.   Screening recommendations/referrals: Colonoscopy aged out Mammogram aged out Bone Density up to date Recommended yearly ophthalmology/optometry visit for glaucoma screening and checkup Recommended yearly dental visit for hygiene and checkup  Vaccinations: Influenza vaccine - declined vaccine. Pneumococcal vaccine up to date Tdap vaccine up to date Shingles vaccine up to date COVID vaccine: recommended People 68 and older can get a COVID-19 vaccination from [facility].   [facility] in [place] by appointment only. This will be for anyone 33 and older. People can be vaccinated at this time only if they register in advance. That can be done online at [email or web address] or by calling [phone]. You do not have to be a resident of [facility] to be vaccinated.   [facility]) is pleased to announce that COVID-19 vaccinations will be available to Phase 1B adults who are 33 years or older regardless of health status or living situation. Those in this group can make a vaccination appointment by calling [phone] and selecting Option 2 beginning Friday, January 8 at 8:00 AM. Appointments are required.   Advanced directives: to complete paperwork on advanced directives   Conditions/risks identified: -encouraged to increase physical activity/exercise  Next appointment: 1 year.    Preventive Care 19 Years and Older, Female Preventive care refers to lifestyle choices and visits with your health care provider that can promote health and wellness. What does preventive care include?  A yearly physical exam. This is also called an annual well  check.  Dental exams once or twice a year.  Routine eye exams. Ask your health care provider how often you should have your eyes checked.  Personal lifestyle choices, including:  Daily care of your teeth and gums.  Regular physical activity.  Eating a healthy diet.  Avoiding tobacco and drug use.  Limiting alcohol use.  Practicing safe sex.  Taking low-dose aspirin every day.  Taking vitamin and mineral supplements as recommended by your health care provider. What happens during an annual well check? The services and screenings done by your health care provider during your annual well check will depend on your age, overall health, lifestyle risk factors, and family history of disease. Counseling  Your health care provider may ask you questions about your:  Alcohol use.  Tobacco use.  Drug use.  Emotional well-being.  Home and relationship well-being.  Sexual activity.  Eating habits.  History of falls.  Memory and ability to understand (cognition).  Work and work Statistician.  Reproductive health. Screening  You may have the following tests or measurements:  Height, weight, and BMI.  Blood pressure.  Lipid and cholesterol levels. These may be checked every 5 years, or more frequently if you are over 11 years old.  Skin check.  Lung cancer screening. You may have this screening every year starting at age 1 if you have a 30-pack-year history of smoking and currently smoke or have quit within the past 15 years.  Fecal occult blood test (FOBT) of the stool. You may have this test every year starting at age 12.  Flexible sigmoidoscopy or colonoscopy. You may have a sigmoidoscopy every 5 years or a colonoscopy every 10 years starting at age  50.  Hepatitis C blood test.  Hepatitis B blood test.  Sexually transmitted disease (STD) testing.  Diabetes screening. This is done by checking your blood sugar (glucose) after you have not eaten for a while  (fasting). You may have this done every 1-3 years.  Bone density scan. This is done to screen for osteoporosis. You may have this done starting at age 47.  Mammogram. This may be done every 1-2 years. Talk to your health care provider about how often you should have regular mammograms. Talk with your health care provider about your test results, treatment options, and if necessary, the need for more tests. Vaccines  Your health care provider may recommend certain vaccines, such as:  Influenza vaccine. This is recommended every year.  Tetanus, diphtheria, and acellular pertussis (Tdap, Td) vaccine. You may need a Td booster every 10 years.  Zoster vaccine. You may need this after age 39.  Pneumococcal 13-valent conjugate (PCV13) vaccine. One dose is recommended after age 32.  Pneumococcal polysaccharide (PPSV23) vaccine. One dose is recommended after age 60. Talk to your health care provider about which screenings and vaccines you need and how often you need them. This information is not intended to replace advice given to you by your health care provider. Make sure you discuss any questions you have with your health care provider. Document Released: 02/13/2015 Document Revised: 10/07/2015 Document Reviewed: 11/18/2014 Elsevier Interactive Patient Education  2017 [place] Prevention in the Home Falls can cause injuries. They can happen to people of all ages. There are many things you can do to make your home safe and to help prevent falls. What can I do on the outside of my home?  Regularly fix the edges of walkways and driveways and fix any cracks.  Remove anything that might make you trip as you walk through a door, such as a raised step or threshold.  Trim any bushes or trees on the path to your home.  Use bright outdoor lighting.  Clear any walking paths of anything that might make someone trip, such as rocks or tools.  Regularly check to see if handrails are loose  or broken. Make sure that both sides of any steps have handrails.  Any raised decks and porches should have guardrails on the edges.  Have any leaves, snow, or ice cleared regularly.  Use sand or salt on walking paths during winter.  Clean up any spills in your garage right away. This includes oil or grease spills. What can I do in the bathroom?  Use night lights.  Install grab bars by the toilet and in the tub and shower. Do not use towel bars as grab bars.  Use non-skid mats or decals in the tub or shower.  If you need to sit down in the shower, use a plastic, non-slip stool.  Keep the floor dry. Clean up any water that spills on the floor as soon as it happens.  Remove soap buildup in the tub or shower regularly.  Attach bath mats securely with double-sided non-slip rug tape.  Do not have throw rugs and other things on the floor that can make you trip. What can I do in the bedroom?  Use night lights.  Make sure that you have a light by your bed that is easy to reach.  Do not use any sheets or blankets that are too big for your bed. They should not hang down onto the floor.  Have a firm chair that  has side arms. You can use this for support while you get dressed.  Do not have throw rugs and other things on the floor that can make you trip. What can I do in the kitchen?  Clean up any spills right away.  Avoid walking on wet floors.  Keep items that you use a lot in easy-to-reach places.  If you need to reach something above you, use a strong step stool that has a grab bar.  Keep electrical cords out of the way.  Do not use floor polish or wax that makes floors slippery. If you must use wax, use non-skid floor wax.  Do not have throw rugs and other things on the floor that can make you trip. What can I do with my stairs?  Do not leave any items on the stairs.  Make sure that there are handrails on both sides of the stairs and use them. Fix handrails that are  broken or loose. Make sure that handrails are as long as the stairways.  Check any carpeting to make sure that it is firmly attached to the stairs. Fix any carpet that is loose or worn.  Avoid having throw rugs at the top or bottom of the stairs. If you do have throw rugs, attach them to the floor with carpet tape.  Make sure that you have a light switch at the top of the stairs and the bottom of the stairs. If you do not have them, ask someone to add them for you. What else can I do to help prevent falls?  Wear shoes that:  Do not have high heels.  Have rubber bottoms.  Are comfortable and fit you well.  Are closed at the toe. Do not wear sandals.  If you use a stepladder:  Make sure that it is fully opened. Do not climb a closed stepladder.  Make sure that both sides of the stepladder are locked into place.  Ask someone to hold it for you, if possible.  Clearly mark and make sure that you can see:  Any grab bars or handrails.  First and last steps.  Where the edge of each step is.  Use tools that help you move around (mobility aids) if they are needed. These include:  Canes.  Walkers.  Scooters.  Crutches.  Turn on the lights when you go into a dark area. Replace any light bulbs as soon as they burn out.  Set up your furniture so you have a clear path. Avoid moving your furniture around.  If any of your floors are uneven, fix them.  If there are any pets around you, be aware of where they are.  Review your medicines with your doctor. Some medicines can make you feel dizzy. This can increase your chance of falling. Ask your doctor what other things that you can do to help prevent falls. This information is not intended to replace advice given to you by your health care provider. Make sure you discuss any questions you have with your health care provider. Document Released: 11/13/2008 Document Revised: 06/25/2015 Document Reviewed: 02/21/2014 Elsevier  Interactive Patient Education  2017 [employer].

## 2019-02-18 ENCOUNTER — Ambulatory Visit: Payer: Medicare HMO | Admitting: Physical Therapy

## 2019-02-20 ENCOUNTER — Ambulatory Visit: Payer: Medicare HMO | Admitting: Physical Therapy

## 2019-02-27 DIAGNOSIS — E559 Vitamin D deficiency, unspecified: Secondary | ICD-10-CM | POA: Diagnosis not present

## 2019-02-27 DIAGNOSIS — J329 Chronic sinusitis, unspecified: Secondary | ICD-10-CM | POA: Diagnosis not present

## 2019-02-27 DIAGNOSIS — E7211 Homocystinuria: Secondary | ICD-10-CM | POA: Diagnosis not present

## 2019-02-27 DIAGNOSIS — R748 Abnormal levels of other serum enzymes: Secondary | ICD-10-CM | POA: Diagnosis not present

## 2019-02-27 DIAGNOSIS — E60 Dietary zinc deficiency: Secondary | ICD-10-CM | POA: Diagnosis not present

## 2019-02-27 DIAGNOSIS — E279 Disorder of adrenal gland, unspecified: Secondary | ICD-10-CM | POA: Diagnosis not present

## 2019-02-27 DIAGNOSIS — Z77120 Contact with and (suspected) exposure to mold (toxic): Secondary | ICD-10-CM | POA: Diagnosis not present

## 2019-03-01 DIAGNOSIS — H26492 Other secondary cataract, left eye: Secondary | ICD-10-CM | POA: Diagnosis not present

## 2019-03-01 DIAGNOSIS — H353131 Nonexudative age-related macular degeneration, bilateral, early dry stage: Secondary | ICD-10-CM | POA: Diagnosis not present

## 2019-03-01 DIAGNOSIS — H0102A Squamous blepharitis right eye, upper and lower eyelids: Secondary | ICD-10-CM | POA: Diagnosis not present

## 2019-03-01 DIAGNOSIS — H0102B Squamous blepharitis left eye, upper and lower eyelids: Secondary | ICD-10-CM | POA: Diagnosis not present

## 2019-03-01 DIAGNOSIS — H43811 Vitreous degeneration, right eye: Secondary | ICD-10-CM | POA: Diagnosis not present

## 2019-03-01 DIAGNOSIS — Z961 Presence of intraocular lens: Secondary | ICD-10-CM | POA: Diagnosis not present

## 2019-03-01 DIAGNOSIS — H04123 Dry eye syndrome of bilateral lacrimal glands: Secondary | ICD-10-CM | POA: Diagnosis not present

## 2019-03-05 DIAGNOSIS — S329XXD Fracture of unspecified parts of lumbosacral spine and pelvis, subsequent encounter for fracture with routine healing: Secondary | ICD-10-CM | POA: Diagnosis not present

## 2019-03-07 ENCOUNTER — Ambulatory Visit (INDEPENDENT_AMBULATORY_CARE_PROVIDER_SITE_OTHER): Payer: Medicare HMO | Admitting: Internal Medicine

## 2019-03-07 ENCOUNTER — Other Ambulatory Visit: Payer: Self-pay

## 2019-03-07 ENCOUNTER — Encounter: Payer: Self-pay | Admitting: Internal Medicine

## 2019-03-07 VITALS — BP 124/68 | HR 77 | Temp 97.5°F | Ht 68.5 in | Wt 120.5 lb

## 2019-03-07 DIAGNOSIS — F422 Mixed obsessional thoughts and acts: Secondary | ICD-10-CM | POA: Diagnosis not present

## 2019-03-07 DIAGNOSIS — R739 Hyperglycemia, unspecified: Secondary | ICD-10-CM

## 2019-03-07 DIAGNOSIS — I7300 Raynaud's syndrome without gangrene: Secondary | ICD-10-CM | POA: Diagnosis not present

## 2019-03-07 DIAGNOSIS — E890 Postprocedural hypothyroidism: Secondary | ICD-10-CM

## 2019-03-07 DIAGNOSIS — E441 Mild protein-calorie malnutrition: Secondary | ICD-10-CM | POA: Diagnosis not present

## 2019-03-07 DIAGNOSIS — M810 Age-related osteoporosis without current pathological fracture: Secondary | ICD-10-CM | POA: Diagnosis not present

## 2019-03-07 MED ORDER — FISH OIL 1000 MG PO CPDR: 4000.0000 mg | DELAYED_RELEASE_CAPSULE | Freq: Every day | ORAL | 5 refills | Status: DC
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
## 2019-03-08 LAB — HEMOGLOBIN A1C
Hgb A1c MFr Bld: 5.4 % of total Hgb (ref <5.7)
Mean Plasma Glucose: 108 (calc)
eAG (mmol/L): 6 (calc)

## 2019-03-08 LAB — CBC WITH DIFFERENTIAL/PLATELET
Absolute Monocytes: 643 cells/uL (ref 200–950)
Basophils Absolute: 32 cells/uL (ref 0–200)
Basophils Relative: 0.6 %
Eosinophils Absolute: 151 cells/uL (ref 15–500)
Eosinophils Relative: 2.8 %
HCT: 36.8 % (ref 35.0–45.0)
Hemoglobin: 12.4 g/dL (ref 11.7–15.5)
Lymphs Abs: 1199 cells/uL (ref 850–3900)
MCH: 31.3 pg (ref 27.0–33.0)
MCHC: 33.7 g/dL (ref 32.0–36.0)
MCV: 92.9 fL (ref 80.0–100.0)
MPV: 11.7 fL (ref 7.5–12.5)
Monocytes Relative: 11.9 %
Neutro Abs: 3375 cells/uL (ref 1500–7800)
Neutrophils Relative %: 62.5 %
Platelets: 156 10*3/uL (ref 140–400)
RBC: 3.96 10*6/uL (ref 3.80–5.10)
RDW: 12.4 % (ref 11.0–15.0)
Total Lymphocyte: 22.2 %
WBC: 5.4 10*3/uL (ref 3.8–10.8)

## 2019-03-08 LAB — COMPLETE METABOLIC PANEL WITH GFR
AG Ratio: 1.5 (calc) (ref 1.0–2.5)
ALT: 11 U/L (ref 6–29)
AST: 18 U/L (ref 10–35)
Albumin: 3.7 g/dL (ref 3.6–5.1)
Alkaline phosphatase (APISO): 73 U/L (ref 37–153)
BUN/Creatinine Ratio: 33 (calc) — ABNORMAL HIGH (ref 6–22)
BUN: 19 mg/dL (ref 7–25)
CO2: 28 mmol/L (ref 20–32)
Calcium: 9.5 mg/dL (ref 8.6–10.4)
Chloride: 105 mmol/L (ref 98–110)
Creat: 0.58 mg/dL — ABNORMAL LOW (ref 0.60–0.93)
GFR, Est African American: 102 mL/min/{1.73_m2} (ref >=60)
GFR, Est Non African American: 88 mL/min/{1.73_m2} (ref >=60)
Globulin: 2.4 g/dL (calc) (ref 1.9–3.7)
Glucose, Bld: 85 mg/dL (ref 65–139)
Potassium: 4.5 mmol/L (ref 3.5–5.3)
Sodium: 140 mmol/L (ref 135–146)
Total Bilirubin: 0.5 mg/dL (ref 0.2–1.2)
Total Protein: 6.1 g/dL (ref 6.1–8.1)

## 2019-03-08 LAB — TSH: TSH: 1.46 mIU/L (ref 0.40–4.50)

## 2019-03-08 LAB — T4, FREE: Free T4: 1 ng/dL (ref 0.8–1.8)

## 2019-03-08 NOTE — Progress Notes (Signed)
Blood counts are normal.  No elevation of white blood cells to suggest any type of infection in her bloodstream. Thyroid is normal.  So, no need for thyroid treatments of any kind. Electrolytes are normal.  Creatinine is low which still indicates inadequate protein intake.  Albumin (long term measure of protein in the body) is low normal. Liver is normal. Sugar average is normal.

## 2019-03-17 ENCOUNTER — Ambulatory Visit: Payer: Medicare HMO | Attending: Internal Medicine

## 2019-03-17 DIAGNOSIS — Z23 Encounter for immunization: Secondary | ICD-10-CM | POA: Insufficient documentation

## 2019-03-17 NOTE — Progress Notes (Signed)
   Covid-19 Vaccination Clinic  Name:  Yolanda Robbins    MRN: [id number] DOB: [birth year]  03/17/2019  [name] was observed post Covid-19 immunization for 15 minutes without incidence. She was provided with Vaccine Information Sheet and instruction to access the V-Safe system.   [name] was instructed to call 911 with any severe reactions post vaccine: [name] Difficulty breathing  . Swelling of your face and throat  . A fast heartbeat  . A bad rash all over your body  . Dizziness and weakness    Immunizations Administered    Name Date Dose VIS Date Route   Pfizer COVID-19 Vaccine 03/17/2019  2:14 PM 0.3 mL 01/11/2019 Intramuscular   Manufacturer: [place]   Lot: [id number]   [place]: [id number]

## 2019-04-09 ENCOUNTER — Ambulatory Visit: Payer: Medicare HMO | Attending: Internal Medicine

## 2019-04-09 ENCOUNTER — Ambulatory Visit: Payer: Medicare HMO

## 2019-04-09 DIAGNOSIS — Z23 Encounter for immunization: Secondary | ICD-10-CM | POA: Insufficient documentation

## 2019-04-09 NOTE — Progress Notes (Signed)
   Covid-19 Vaccination Clinic  Name:  Yolanda Robbins    MRN: [id number] DOB: 1940/08/08  04/09/2019  [name] was observed post Covid-19 immunization for 15 minutes without incident. She was provided with Vaccine Information Sheet and instruction to access the V-Safe system.   [name] was instructed to call 911 with any severe reactions post vaccine: [name] Difficulty breathing  . Swelling of face and throat  . A fast heartbeat  . A bad rash all over body  . Dizziness and weakness   Immunizations Administered    Name Date Dose VIS Date Route   Pfizer COVID-19 Vaccine 04/09/2019  9:48 AM 0.3 mL 01/11/2019 Intramuscular   Manufacturer: [place]   Lot: [id number]   [place]: [id number]

## 2019-04-10 ENCOUNTER — Ambulatory Visit: Payer: Medicare HMO

## 2019-04-16 ENCOUNTER — Encounter: Payer: Self-pay | Admitting: Internal Medicine

## 2019-04-18 ENCOUNTER — Ambulatory Visit (INDEPENDENT_AMBULATORY_CARE_PROVIDER_SITE_OTHER): Payer: Medicare HMO | Admitting: Internal Medicine

## 2019-04-18 ENCOUNTER — Other Ambulatory Visit: Payer: Self-pay

## 2019-04-18 ENCOUNTER — Encounter: Payer: Self-pay | Admitting: Internal Medicine

## 2019-04-18 VITALS — BP 122/74 | HR 77 | Temp 97.7°F | Ht 69.0 in | Wt 123.6 lb

## 2019-04-18 DIAGNOSIS — Z9189 Other specified personal risk factors, not elsewhere classified: Secondary | ICD-10-CM | POA: Diagnosis not present

## 2019-04-18 DIAGNOSIS — R0781 Pleurodynia: Secondary | ICD-10-CM | POA: Insufficient documentation

## 2019-04-18 DIAGNOSIS — W19XXXA Unspecified fall, initial encounter: Secondary | ICD-10-CM | POA: Diagnosis not present

## 2019-04-18 DIAGNOSIS — F422 Mixed obsessional thoughts and acts: Secondary | ICD-10-CM

## 2019-04-18 DIAGNOSIS — G3184 Mild cognitive impairment, so stated: Secondary | ICD-10-CM

## 2019-04-18 DIAGNOSIS — R4789 Other speech disturbances: Secondary | ICD-10-CM
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
## 2019-04-18 NOTE — Patient Instructions (Addendum)
Try going together on some errands and see how driving goes.    Let's meet again the week after you see integrative medicine.

## 2019-05-16 ENCOUNTER — Encounter: Payer: Self-pay | Admitting: Internal Medicine

## 2019-05-16 DIAGNOSIS — E559 Vitamin D deficiency, unspecified: Secondary | ICD-10-CM | POA: Diagnosis not present

## 2019-05-16 DIAGNOSIS — J329 Chronic sinusitis, unspecified: Secondary | ICD-10-CM | POA: Diagnosis not present

## 2019-05-16 DIAGNOSIS — Z8619 Personal history of other infectious and parasitic diseases: Secondary | ICD-10-CM | POA: Diagnosis not present

## 2019-05-16 DIAGNOSIS — E638 Other specified nutritional deficiencies: Secondary | ICD-10-CM | POA: Diagnosis not present

## 2019-05-16 DIAGNOSIS — Z77120 Contact with and (suspected) exposure to mold (toxic): Secondary | ICD-10-CM | POA: Diagnosis not present

## 2019-05-16 DIAGNOSIS — E279 Disorder of adrenal gland, unspecified: Secondary | ICD-10-CM | POA: Diagnosis not present

## 2019-05-16 DIAGNOSIS — Z853 Personal history of malignant neoplasm of breast: Secondary | ICD-10-CM | POA: Diagnosis not present

## 2019-05-16 DIAGNOSIS — R5383 Other fatigue: Secondary | ICD-10-CM | POA: Diagnosis not present

## 2019-05-16 DIAGNOSIS — E7210 Disorders of sulfur-bearing amino-acid metabolism, unspecified: Secondary | ICD-10-CM | POA: Diagnosis not present

## 2019-05-16 DIAGNOSIS — E7211 Homocystinuria: Secondary | ICD-10-CM | POA: Diagnosis not present

## 2019-05-23 ENCOUNTER — Other Ambulatory Visit: Payer: Self-pay

## 2019-05-23 ENCOUNTER — Encounter: Payer: Self-pay | Admitting: Internal Medicine

## 2019-05-23 ENCOUNTER — Ambulatory Visit (INDEPENDENT_AMBULATORY_CARE_PROVIDER_SITE_OTHER): Payer: Medicare HMO | Admitting: Internal Medicine

## 2019-05-23 ENCOUNTER — Ambulatory Visit: Payer: Medicare HMO | Admitting: Internal Medicine

## 2019-05-23 VITALS — BP 122/82 | HR 76 | Temp 97.9°F | Ht 69.0 in | Wt 122.0 lb

## 2019-05-23 DIAGNOSIS — E441 Mild protein-calorie malnutrition: Secondary | ICD-10-CM | POA: Diagnosis not present

## 2019-05-23 DIAGNOSIS — R636 Underweight: Secondary | ICD-10-CM | POA: Diagnosis not present

## 2019-05-23 DIAGNOSIS — E890 Postprocedural hypothyroidism: Secondary | ICD-10-CM | POA: Diagnosis not present

## 2019-05-23 DIAGNOSIS — M810 Age-related osteoporosis without current pathological fracture: Secondary | ICD-10-CM | POA: Diagnosis not present

## 2019-05-23 DIAGNOSIS — I7300 Raynaud's syndrome without gangrene: Secondary | ICD-10-CM | POA: Diagnosis not present

## 2019-05-23 DIAGNOSIS — Z681 Body mass index (BMI) 19 or less, adult: Secondary | ICD-10-CM | POA: Diagnosis not present

## 2019-05-23 DIAGNOSIS — Z79899 Other long term (current) drug therapy: Secondary | ICD-10-CM | POA: Diagnosis not present

## 2019-05-23 NOTE — Progress Notes (Signed)
Location:  [facility] clinic Provider:  Donavin Audino L. Mariea Clonts, D.O., C.M.D.  Goals of Care:  Advanced Directives 05/23/2019  Does Patient Have a Medical Advance Directive? Yes  Type of Paramedic of [place];Living will  Does patient want to make changes to medical advance directive? No - Patient declined  Copy of [place] in Chart? Yes - validated most recent copy scanned in chart (See row information)  Would patient like information on creating a medical advance directive? -     Chief Complaint  Patient presents with  . Medical Management of Chronic Issues    5 week follow up     HPI: Patient is a 79 y.o. female seen today for medical management of chronic diseases--5 wk f/u after she was going to see the specialist about her "mold infection" affecting her cognition.  Cognitive issues have gotten a lot better since mold was treated.    She has freezing cold hands.    Getting 40-50g protein while she was counting for a while.  She is on a 5g carb restriction.  She has lost 20 lbs since fall of last year while on this diet.    We spent this visit once again reviewing her extensive supplement list and discontinuing everything I suggested she stop.  Past Medical History:  Diagnosis Date  . Anxiety   . Cancer [facility])    Breast/ bilateral mastectomy  . Macular degeneration (senile) of retina   . 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 Osteoporosis     Past Surgical History:  Procedure Laterality Date  . BREAST SURGERY    . FOOT SURGERY    . MASTECTOMY    . NASAL SEPTUM SURGERY    . OOPHORECTOMY     Bil.oophorectomy  . TONSILLECTOMY AND ADENOIDECTOMY    . TUBAL LIGATION      No Known Allergies  Outpatient Encounter Medications as of 05/23/2019  Medication Sig  . ADK 5000-5000-500 UNIT-MCG CAPS Take 1 tablet by mouth daily.   . Ascorbic Acid (VITAMIN C) 1000 MG tablet Take 1,000 mg by mouth daily.  [name] b complex  vitamins tablet Take 1 tablet by mouth daily.  . Boron 3 MG CAPS Take 1 capsule by mouth daily.  . Calcium Carb-Cholecalciferol (CALCIUM 1000 + D PO) Take 1 tablet by mouth daily.   . Cholecalciferol (VITAMIN D-3 PO) Take 2,500 Units by mouth daily. FROM ORGANIC LICHEN  . cycloSPORINE (RESTASIS) 0.05 % ophthalmic emulsion Place 1 drop into both eyes 2 (two) times daily.  . Lutein 20 MG CAPS Take 1 tablet by mouth daily.  . Melatonin 3 MG TABS Take 1 tablet (3 mg total) by mouth at bedtime.  . NON FORMULARY Takes CBD oil  . Nutritional Supplements (JUICE PLUS FIBRE PO) Take 2 tablets by mouth daily. ORCHARD AND OMEGA JUICE PLUS IN THE MORNING, also taking 2 veggies in the evening  . Omega-3 Fatty Acids (FISH OIL) 1000 MG CPDR Take 4,000 mg by mouth daily.  [name] OVER THE COUNTER MEDICATION Take 5 tablets by mouth daily. MASTER, AMINO ACID 1,000  . PROGESTERONE PO Take 200 mg by mouth daily at 6 (six) AM.  . UNABLE TO FIND Med Name: Transfer Factor-immune support, 3 by mouth daily  . zinc gluconate 50 MG tablet Take 50 mg by mouth daily.  . [DISCONTINUED] progesterone (PROMETRIUM) 100 MG capsule Take 200 mg by mouth daily.   No facility-administered encounter medications on file as of 05/23/2019.  Review of Systems:  Review of Systems  Constitutional: Positive for weight loss. Negative for chills, fever and malaise/fatigue.  Eyes: Negative for blurred vision.  Respiratory: Negative for cough and shortness of breath.   Cardiovascular: Negative for chest pain, palpitations and leg swelling.  Gastrointestinal: Negative for abdominal pain.  Genitourinary: Negative for dysuria.  Musculoskeletal: Negative for falls and joint pain.  Neurological: Negative for dizziness and loss of consciousness.  Endo/Heme/Allergies: Bruises/bleeds easily.  Psychiatric/Behavioral: Positive for memory loss. Negative for depression. The patient is nervous/anxious. The patient does not have insomnia.     Health  Maintenance  Topic Date Due  . TETANUS/TDAP  10/26/2028  . DEXA SCAN  Completed  . COVID-19 Vaccine  Completed  . PNA vac Low Risk Adult  Completed    Physical Exam: Vitals:   05/23/19 1321  BP: 122/82  Pulse: 76  Temp: 97.9 F (36.6 C)  TempSrc: Temporal  SpO2: 93%  Weight: 122 lb (55.3 kg)  Height: 5\' 9"  (1.753 m)   Body mass index is 18.02 kg/m. Physical Exam Vitals reviewed.  Constitutional:      General: She is not in acute distress.    Appearance: Normal appearance. She is not toxic-appearing.     Comments: Thin female, underweight  HENT:     Head: Normocephalic and atraumatic.  Cardiovascular:     Rate and Rhythm: Normal rate and regular rhythm.  Pulmonary:     Effort: Pulmonary effort is normal.     Breath sounds: Normal breath sounds.  Abdominal:     General: Abdomen is flat. Bowel sounds are normal.  Musculoskeletal:        General: Normal range of motion.  Skin:    General: Skin is warm and dry.  Neurological:     General: No focal deficit present.     Mental Status: She is alert and oriented to person, place, and time.  Psychiatric:        Mood and Affect: Mood normal.     Comments: anxious     Labs reviewed: Basic Metabolic Panel: Recent Labs    07/09/18 1111 10/26/18 1940 10/27/18 0722 10/29/18 0414 03/07/19 1201  NA  --    < > 133* 139 140  K  --    < > 4.0 4.1 4.5  CL  --    < > 100 105 105  CO2  --    < > 24 28 28   GLUCOSE  --    < > 184* 102* 85  BUN  --    < > 10 15 19   CREATININE  --    < > 0.67 0.66 0.58*  CALCIUM  --    < > 8.2* 8.8* 9.5  TSH 3.88  --   --   --  1.46   < > = values in this interval not displayed.   Liver Function Tests: Recent Labs    10/26/18 1940 10/26/18 1940 10/27/18 0722 12/03/18 1139 03/07/19 1201  AST 87*   < > 92* 17 18  ALT 53*   < > 54* 10 11  ALKPHOS 69  --  67  --   --   BILITOT 0.9   < > 0.5 0.4 0.5  PROT 5.9*   < > 5.9* 6.6 6.1  ALBUMIN 3.6  --  3.5  --   --    < > = values in this  interval not displayed.   No results for input(s): LIPASE, AMYLASE in the last  8760 hours. No results for input(s): AMMONIA in the last 8760 hours. CBC: Recent Labs    10/27/18 0722 10/27/18 0722 10/29/18 0414 11/22/18 1332 03/07/19 1201  WBC 13.6*   < > 7.6 6.1 5.4  NEUTROABS 11.6*  --   --  3,867 3,375  HGB 12.2   < > 11.1* 12.7 12.4  HCT 36.0   < > 31.8* 37.5 36.8  MCV 95.0   < > 94.1 97.2 92.9  PLT 87*   < > 76* 174 156   < > = values in this interval not displayed.   Lipid Panel: No results for input(s): CHOL, HDL, LDLCALC, TRIG, CHOLHDL, LDLDIRECT in the last 8760 hours. Lab Results  Component Value Date   HGBA1C 5.4 03/07/2019   Assessment/Plan 1. Underweight - losing more weight due to following a very restrictive diet with Freddie Apley, NP, who has her on some type of protocol to get the mold out of her system and improve her cognition - Amb ref to Medical Nutrition Therapy-MNT to help gain weight into normal range  2. Mild protein-calorie malnutrition ([place]) - follows vegan diet which is ok if she's allowed to actually eat other foods to maintain weight, but has been asked to restrict gluten and get only organic and various other extremely restrictive dietary suggestions while she keeps losing weight--needs some mass on her bones if she gets ill or falls - Amb ref to Medical Nutrition Therapy-MNT  3. Body mass index (BMI) of 19 or less in adult - discussed that she is underweight and needs to gain - Amb ref to Medical Nutrition Therapy-MNT  4. Polypharmacy -spent visit once again trimming away numerous supplements she does not need if she eats adequate calories   5. Postablative hypothyroidism -had this but latest thyroid labs were in normal range according to standard internal medicine and lab guidance so no medication has been necessary (pt has been advised to replete her iodine per her integrative specialist)  6. Senile osteoporosis -with prior pelvic  fx -needs adequate ca and D supplementation which she is getting -encouraged weightbearing exercise and a little sun exposure to help  7. Raynaud's disease without gangrene -noted as cause of occasional cold fingers--encouraged warming when this occurs  Labs/tests ordered:  No new today Next appt:  09/23/2019  Toula Miyasaki L. Kalyan Barabas, D.O. [facility] [address], [place] [address] Cell Phone (Mon-Fri 8am-5pm):  [phone] On Call:  [phone] & follow prompts after 5pm & weekends Office Phone:  [phone] Office Fax:  [phone]

## 2019-06-03 ENCOUNTER — Encounter: Payer: Self-pay | Admitting: Internal Medicine

## 2019-06-03 DIAGNOSIS — R141 Gas pain: Secondary | ICD-10-CM

## 2019-06-03 DIAGNOSIS — R142 Eructation: Secondary | ICD-10-CM

## 2019-06-03 DIAGNOSIS — N951 Menopausal and female climacteric states: Secondary | ICD-10-CM

## 2019-06-03 DIAGNOSIS — F429 Obsessive-compulsive disorder, unspecified: Secondary | ICD-10-CM

## 2019-06-03 DIAGNOSIS — M40209 Unspecified kyphosis, site unspecified: Secondary | ICD-10-CM

## 2019-06-03 DIAGNOSIS — C50919 Malignant neoplasm of unspecified site of unspecified female breast: Secondary | ICD-10-CM

## 2019-06-03 DIAGNOSIS — H04129 Dry eye syndrome of unspecified lacrimal gland: Secondary | ICD-10-CM

## 2019-06-03 DIAGNOSIS — Z77120 Contact with and (suspected) exposure to mold (toxic): Secondary | ICD-10-CM

## 2019-06-03 DIAGNOSIS — R5383 Other fatigue: Secondary | ICD-10-CM

## 2019-06-05 ENCOUNTER — Ambulatory Visit: Payer: Medicare HMO | Admitting: Internal Medicine

## 2019-06-12 ENCOUNTER — Ambulatory Visit: Payer: Medicare HMO | Admitting: Obstetrics and Gynecology

## 2019-06-17 ENCOUNTER — Other Ambulatory Visit: Payer: Self-pay

## 2019-06-17 ENCOUNTER — Encounter: Payer: Self-pay | Admitting: Internal Medicine

## 2019-06-17 ENCOUNTER — Ambulatory Visit (INDEPENDENT_AMBULATORY_CARE_PROVIDER_SITE_OTHER): Payer: Medicare HMO | Admitting: Internal Medicine

## 2019-06-17 VITALS — Temp 97.3°F | Ht 67.0 in | Wt 122.6 lb

## 2019-06-17 DIAGNOSIS — R279 Unspecified lack of coordination: Secondary | ICD-10-CM | POA: Diagnosis not present

## 2019-06-17 DIAGNOSIS — F422 Mixed obsessional thoughts and acts: Secondary | ICD-10-CM | POA: Diagnosis not present

## 2019-06-17 DIAGNOSIS — R636 Underweight: Secondary | ICD-10-CM | POA: Diagnosis not present

## 2019-06-17 DIAGNOSIS — G3184 Mild cognitive impairment, so stated: Secondary | ICD-10-CM | POA: Diagnosis not present

## 2019-06-17 DIAGNOSIS — H539 Unspecified visual disturbance: Secondary | ICD-10-CM | POA: Diagnosis not present

## 2019-06-17 DIAGNOSIS — R4789 Other speech disturbances: Secondary | ICD-10-CM

## 2019-06-17 NOTE — Patient Instructions (Signed)
I recommend an eye exam to be sure there is not something amiss visually that we're missing.
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
## 2019-06-20 ENCOUNTER — Other Ambulatory Visit: Payer: Self-pay

## 2019-06-20 ENCOUNTER — Ambulatory Visit: Payer: Medicare HMO | Admitting: Obstetrics and Gynecology

## 2019-06-20 DIAGNOSIS — H0102A Squamous blepharitis right eye, upper and lower eyelids: Secondary | ICD-10-CM | POA: Diagnosis not present

## 2019-06-20 DIAGNOSIS — H353131 Nonexudative age-related macular degeneration, bilateral, early dry stage: Secondary | ICD-10-CM | POA: Diagnosis not present

## 2019-06-20 DIAGNOSIS — Z961 Presence of intraocular lens: Secondary | ICD-10-CM | POA: Diagnosis not present

## 2019-06-20 DIAGNOSIS — H524 Presbyopia: Secondary | ICD-10-CM | POA: Diagnosis not present

## 2019-06-20 DIAGNOSIS — H26492 Other secondary cataract, left eye: Secondary | ICD-10-CM | POA: Diagnosis not present

## 2019-06-20 DIAGNOSIS — H43811 Vitreous degeneration, right eye: Secondary | ICD-10-CM | POA: Diagnosis not present

## 2019-06-20 DIAGNOSIS — H5202 Hypermetropia, left eye: Secondary | ICD-10-CM | POA: Diagnosis not present

## 2019-06-20 DIAGNOSIS — H16223 Keratoconjunctivitis sicca, not specified as Sjogren's, bilateral: Secondary | ICD-10-CM | POA: Diagnosis not present

## 2019-06-20 DIAGNOSIS — H0102B Squamous blepharitis left eye, upper and lower eyelids: Secondary | ICD-10-CM | POA: Diagnosis not present
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
## 2019-06-21 ENCOUNTER — Encounter: Payer: Self-pay | Admitting: Obstetrics and Gynecology

## 2019-06-21 ENCOUNTER — Ambulatory Visit: Payer: Medicare HMO | Admitting: Obstetrics and Gynecology

## 2019-06-21 ENCOUNTER — Telehealth: Payer: Self-pay | Admitting: Obstetrics and Gynecology

## 2019-06-21 VITALS — BP 140/86 | HR 62 | Temp 97.3°F | Resp 14 | Ht 68.0 in | Wt 118.0 lb

## 2019-06-21 DIAGNOSIS — R3915 Urgency of urination: Secondary | ICD-10-CM | POA: Diagnosis not present

## 2019-06-21 DIAGNOSIS — N816 Rectocele: Secondary | ICD-10-CM

## 2019-06-21 NOTE — Telephone Encounter (Signed)
Left detailed message per DPR. Pt to return call to office for follow up.

## 2019-06-21 NOTE — Telephone Encounter (Signed)
Patient was seen today for a 6 month f/u  and asked if Dr.Silva would like to see her again in 1 year?

## 2019-06-21 NOTE — Telephone Encounter (Signed)
I recommend follow up prn.  She is going to do physical therapy.  She is also going to consider whether or not she wants to start medication for overactive bladder, which would require an office visit for a voiding trial.

## 2019-06-21 NOTE — Telephone Encounter (Signed)
Patient seen in office today for 6 month pessary check.   Per review of OV notes, f/u PRN.    Dr. Quincy Simmonds -if not using pessary, ok to f/u PRN?

## 2019-06-21 NOTE — Telephone Encounter (Signed)
Spoke with pt's daughter Ishmael Holter, ok per DPR.   Daughter states she will want to possiblly start OAB medication ,but would like to see Dr Quincy Simmonds and discuss before even doing a voiding trial. Daughter states has company for the next few weeks. Pt scheduled for consult with Dr Quincy Simmonds on 07/25/19 at 8am per daughter's request of date and time. Agreeable and verbalized understanding.   Routing to Dr Quincy Simmonds for review.  Encounter closed.

## 2019-07-15 ENCOUNTER — Telehealth: Payer: Self-pay

## 2019-07-15 NOTE — Telephone Encounter (Signed)
Patients daughter called to cancel consult for medication. Patient does not want to take the medication.

## 2019-07-15 NOTE — Telephone Encounter (Signed)
Encounter reviewed and closed.  

## 2019-07-23 DIAGNOSIS — M6281 Muscle weakness (generalized): Secondary | ICD-10-CM | POA: Diagnosis not present

## 2019-07-23 DIAGNOSIS — M6289 Other specified disorders of muscle: Secondary | ICD-10-CM | POA: Diagnosis not present

## 2019-07-23 DIAGNOSIS — N3941 Urge incontinence: Secondary | ICD-10-CM | POA: Diagnosis not present

## 2019-07-23 DIAGNOSIS — R350 Frequency of micturition: Secondary | ICD-10-CM | POA: Diagnosis not present

## 2019-07-25 ENCOUNTER — Ambulatory Visit: Payer: Self-pay | Admitting: Obstetrics and Gynecology

## 2019-07-29 DIAGNOSIS — M6281 Muscle weakness (generalized): Secondary | ICD-10-CM | POA: Diagnosis not present

## 2019-07-29 DIAGNOSIS — N3946 Mixed incontinence: Secondary | ICD-10-CM | POA: Diagnosis not present

## 2019-07-29 DIAGNOSIS — M6289 Other specified disorders of muscle: Secondary | ICD-10-CM | POA: Diagnosis not present

## 2019-08-01 DIAGNOSIS — E7211 Homocystinuria: Secondary | ICD-10-CM | POA: Diagnosis not present

## 2019-08-01 DIAGNOSIS — R5383 Other fatigue: Secondary | ICD-10-CM | POA: Diagnosis not present

## 2019-08-01 DIAGNOSIS — Z8619 Personal history of other infectious and parasitic diseases: Secondary | ICD-10-CM | POA: Diagnosis not present

## 2019-08-01 DIAGNOSIS — E638 Other specified nutritional deficiencies: Secondary | ICD-10-CM | POA: Diagnosis not present

## 2019-08-01 DIAGNOSIS — E7210 Disorders of sulfur-bearing amino-acid metabolism, unspecified: Secondary | ICD-10-CM | POA: Diagnosis not present

## 2019-08-01 DIAGNOSIS — Z853 Personal history of malignant neoplasm of breast: Secondary | ICD-10-CM | POA: Diagnosis not present

## 2019-08-01 DIAGNOSIS — Z77120 Contact with and (suspected) exposure to mold (toxic): Secondary | ICD-10-CM | POA: Diagnosis not present

## 2019-08-01 DIAGNOSIS — R413 Other amnesia: Secondary | ICD-10-CM | POA: Diagnosis not present

## 2019-08-01 DIAGNOSIS — E279 Disorder of adrenal gland, unspecified: Secondary | ICD-10-CM | POA: Diagnosis not present

## 2019-08-13 DIAGNOSIS — N3946 Mixed incontinence: Secondary | ICD-10-CM | POA: Diagnosis not present

## 2019-08-13 DIAGNOSIS — M6281 Muscle weakness (generalized): Secondary | ICD-10-CM | POA: Diagnosis not present

## 2019-08-13 DIAGNOSIS — M62838 Other muscle spasm: Secondary | ICD-10-CM | POA: Diagnosis not present

## 2019-08-13 DIAGNOSIS — R3915 Urgency of urination: Secondary | ICD-10-CM | POA: Diagnosis not present

## 2019-08-13 DIAGNOSIS — R350 Frequency of micturition: Secondary | ICD-10-CM | POA: Diagnosis not present

## 2019-08-13 DIAGNOSIS — M6289 Other specified disorders of muscle: Secondary | ICD-10-CM | POA: Diagnosis not present

## 2019-08-27 DIAGNOSIS — N3946 Mixed incontinence: Secondary | ICD-10-CM | POA: Diagnosis not present

## 2019-08-27 DIAGNOSIS — R3915 Urgency of urination: Secondary | ICD-10-CM | POA: Diagnosis not present

## 2019-08-27 DIAGNOSIS — M62838 Other muscle spasm: Secondary | ICD-10-CM | POA: Diagnosis not present

## 2019-08-27 DIAGNOSIS — M6289 Other specified disorders of muscle: Secondary | ICD-10-CM | POA: Diagnosis not present

## 2019-08-27 DIAGNOSIS — M6281 Muscle weakness (generalized): Secondary | ICD-10-CM | POA: Diagnosis not present

## 2019-08-27 DIAGNOSIS — R351 Nocturia: Secondary | ICD-10-CM | POA: Diagnosis not present

## 2019-09-10 DIAGNOSIS — M6289 Other specified disorders of muscle: Secondary | ICD-10-CM | POA: Diagnosis not present

## 2019-09-10 DIAGNOSIS — R3915 Urgency of urination: Secondary | ICD-10-CM | POA: Diagnosis not present

## 2019-09-10 DIAGNOSIS — M62838 Other muscle spasm: Secondary | ICD-10-CM | POA: Diagnosis not present

## 2019-09-10 DIAGNOSIS — N3946 Mixed incontinence: Secondary | ICD-10-CM | POA: Diagnosis not present

## 2019-09-10 DIAGNOSIS — M6281 Muscle weakness (generalized): Secondary | ICD-10-CM | POA: Diagnosis not present

## 2019-09-17 DIAGNOSIS — R5383 Other fatigue: Secondary | ICD-10-CM | POA: Diagnosis not present

## 2019-09-17 DIAGNOSIS — M62838 Other muscle spasm: Secondary | ICD-10-CM | POA: Diagnosis not present

## 2019-09-17 DIAGNOSIS — M6289 Other specified disorders of muscle: Secondary | ICD-10-CM | POA: Diagnosis not present

## 2019-09-17 DIAGNOSIS — E7210 Disorders of sulfur-bearing amino-acid metabolism, unspecified: Secondary | ICD-10-CM | POA: Diagnosis not present

## 2019-09-17 DIAGNOSIS — D509 Iron deficiency anemia, unspecified: Secondary | ICD-10-CM | POA: Diagnosis not present

## 2019-09-17 DIAGNOSIS — R351 Nocturia: Secondary | ICD-10-CM | POA: Diagnosis not present

## 2019-09-17 DIAGNOSIS — R3915 Urgency of urination: Secondary | ICD-10-CM | POA: Diagnosis not present

## 2019-09-17 DIAGNOSIS — N951 Menopausal and female climacteric states: Secondary | ICD-10-CM | POA: Diagnosis not present

## 2019-09-17 DIAGNOSIS — E559 Vitamin D deficiency, unspecified: Secondary | ICD-10-CM | POA: Diagnosis not present

## 2019-09-17 DIAGNOSIS — N3946 Mixed incontinence: Secondary | ICD-10-CM | POA: Diagnosis not present

## 2019-09-17 DIAGNOSIS — E039 Hypothyroidism, unspecified: Secondary | ICD-10-CM | POA: Diagnosis not present

## 2019-09-17 DIAGNOSIS — M6281 Muscle weakness (generalized): Secondary | ICD-10-CM | POA: Diagnosis not present

## 2019-09-23 ENCOUNTER — Ambulatory Visit (INDEPENDENT_AMBULATORY_CARE_PROVIDER_SITE_OTHER): Payer: Medicare HMO | Admitting: Internal Medicine

## 2019-09-23 ENCOUNTER — Encounter: Payer: Self-pay | Admitting: Internal Medicine

## 2019-09-23 ENCOUNTER — Other Ambulatory Visit: Payer: Self-pay

## 2019-09-23 VITALS — BP 118/62 | HR 67 | Temp 98.1°F | Ht 68.0 in | Wt 119.6 lb

## 2019-09-23 DIAGNOSIS — Z681 Body mass index (BMI) 19 or less, adult: Secondary | ICD-10-CM | POA: Diagnosis not present

## 2019-09-23 DIAGNOSIS — I25119 Atherosclerotic heart disease of native coronary artery with unspecified angina pectoris: Secondary | ICD-10-CM | POA: Diagnosis not present

## 2019-09-23 DIAGNOSIS — R636 Underweight: Secondary | ICD-10-CM | POA: Diagnosis not present

## 2019-09-23 DIAGNOSIS — F422 Mixed obsessional thoughts and acts: Secondary | ICD-10-CM | POA: Diagnosis not present

## 2019-09-23 DIAGNOSIS — F0150 Vascular dementia without behavioral disturbance: Secondary | ICD-10-CM | POA: Diagnosis not present

## 2019-09-23 DIAGNOSIS — G3184 Mild cognitive impairment, so stated: Secondary | ICD-10-CM

## 2019-09-23 DIAGNOSIS — F320 Major depressive disorder, single episode, mild: Secondary | ICD-10-CM | POA: Diagnosis not present

## 2019-09-23 MED ORDER — ESCITALOPRAM OXALATE 5 MG PO TABS: 5.0000 mg | ORAL_TABLET | Freq: Every day | ORAL | 3 refills | Status: DC

## 2019-09-23 NOTE — Progress Notes (Signed)
Location:  [facility] clinic Provider:  Cyenna Rebello L. Mariea Clonts, D.O., C.M.D.  Code Status: DNR Goals of Care:  Advanced Directives 09/23/2019  Does Patient Have a Medical Advance Directive? Yes  Type of Advance Directive Living will  Does patient want to make changes to medical advance directive? No - Patient declined  Copy of [place] in Chart? -  Would patient like information on creating a medical advance directive? -     Chief Complaint  Patient presents with  . Medical Management of Chronic Issues    4 month follow up    HPI: Patient is a 79 y.o. female seen today for medical management of chronic diseases.    Her sister passed away from pancreatic cancer after a very long time fighting it.    She gained a lb.  She's eating a lot of food.  She also can't be still.  Always on the move.  She's still watching her added sugar, gluten, dairy.  They went back to the integrative doctor who talked her into her modified diet. Gluten is b/c of roundup which can cause lymphoma. Dairy--body doesn't know how to process it after 78 yo per daughter.    No aches or pains to report.    She is noting her spider veins showing up.   No pain.    She has anxiety.  She tried the SAM-e.  No effect seen.  Heidi stopped putting it in her box, no difference noted.  She is scared of prescription medications.  She reported only one day a few weeks ago feeling happy.  Had tremors with zoloft.  What made her feel good the one day was that she finished her home tasks done early and she felt happy about that later in the day.  She often gets distracted.  She is moving slowly per [facility].  Discussed lexapro 5mg  with them.     She is going to counseling.  She was not pleased about it in the beginning but she did it for [facility].  Past Medical History:  Diagnosis Date  . Anxiety   . Cancer [facility])    Breast/ bilateral mastectomy  . Macular degeneration (senile) of retina   . 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 Osteoporosis     Past Surgical History:  Procedure Laterality Date  . ABDOMINAL HYSTERECTOMY  01/31/2010  . BREAST SURGERY    . FOOT SURGERY    . MASTECTOMY    . NASAL SEPTUM SURGERY    . OOPHORECTOMY     Bil.oophorectomy  . TONSILLECTOMY AND ADENOIDECTOMY    . TUBAL LIGATION      No Known Allergies  Outpatient Encounter Medications as of 09/23/2019  Medication Sig  . ADK 5000-5000-500 UNIT-MCG CAPS Take 1 tablet by mouth daily.   [name] b complex vitamins tablet Take 1 tablet by mouth daily.  . Boron 3 MG CAPS Take 1 capsule by mouth daily.  . Cholecalciferol (VITAMIN D-3 PO) Take 10,000 Units by mouth daily. FROM ORGANIC LICHEN  . Copper Gluconate (COPPER CAPS) 2 MG CAPS Take by mouth.  . cycloSPORINE (RESTASIS) 0.05 % ophthalmic emulsion Place 1 drop into both eyes 2 (two) times daily.  . Diindolylmethane POWD 1 capsule by Does not apply route daily.  . Lutein 20 MG CAPS Take 1 tablet by mouth daily.  . NON FORMULARY Takes CBD oil  . Omega-3 1000 MG CAPS Take 2,000 mg by mouth in the morning and at bedtime.  [name]  OVER THE COUNTER MEDICATION Take 5 tablets by mouth daily. MASTER, AMINO ACID 1,000  . PROGESTERONE PO Take 200 mg by mouth daily at 6 (six) AM.  . pyridoxine (B-6) 100 MG tablet Take 100 mg by mouth daily.  [name] zinc gluconate 50 MG tablet Take 50 mg by mouth daily.  . [DISCONTINUED] S-Adenosylmethionine (SAM-E) 400 MG TABS Take 400 mg by mouth daily.   No facility-administered encounter medications on file as of 09/23/2019.    Review of Systems:  Review of Systems  Constitutional: Positive for malaise/fatigue and weight loss. Negative for chills and fever.  HENT: Negative for congestion and sore throat.   Eyes: Negative for blurred vision.  Respiratory: Negative for shortness of breath.   Cardiovascular: Negative for chest pain and palpitations.  Gastrointestinal: Negative for abdominal pain.  Genitourinary: Negative for  dysuria.  Musculoskeletal: Negative for falls.  Skin: Negative for rash.  Neurological: Negative for dizziness and loss of consciousness.  Endo/Heme/Allergies: Bruises/bleeds easily.  Psychiatric/Behavioral: Positive for depression and memory loss. The patient is nervous/anxious. The patient does not have insomnia.     Health Maintenance  Topic Date Due  . Hepatitis C Screening  Never done  . TETANUS/TDAP  10/26/2028  . DEXA SCAN  Completed  . COVID-19 Vaccine  Completed  . PNA vac Low Risk Adult  Completed    Physical Exam: Vitals:   09/23/19 1046  BP: 118/62  Pulse: 67  Temp: 98.1 F (36.7 C)  TempSrc: Temporal  SpO2: 97%  Weight: 119 lb 9.6 oz (54.3 kg)  Height: 5\' 8"  (1.727 m)   Body mass index is 18.19 kg/m. Physical Exam Vitals reviewed.  Constitutional:      General: She is not in acute distress.    Appearance: She is not toxic-appearing.  HENT:     Head: Normocephalic and atraumatic.  Cardiovascular:     Rate and Rhythm: Normal rate and regular rhythm.     Pulses: Normal pulses.     Heart sounds: Normal heart sounds.  Pulmonary:     Effort: Pulmonary effort is normal.     Breath sounds: Normal breath sounds. No wheezing, rhonchi or rales.  Musculoskeletal:        General: Normal range of motion.     Cervical back: Neck supple.     Right lower leg: No edema.     Left lower leg: No edema.  Skin:    Comments: Increased varicosities of feet  Neurological:     General: No focal deficit present.     Mental Status: She is alert and oriented to person, place, and time.  Psychiatric:     Comments: Mood seems down, dark circles under eyes     Labs reviewed: Basic Metabolic Panel: Recent Labs    10/27/18 0722 10/29/18 0414 03/07/19 1201  NA 133* 139 140  K 4.0 4.1 4.5  CL 100 105 105  CO2 24 28 28   GLUCOSE 184* 102* 85  BUN 10 15 19   CREATININE 0.67 0.66 0.58*  CALCIUM 8.2* 8.8* 9.5  TSH  --   --  1.46   Liver Function Tests: Recent Labs     10/26/18 1940 10/26/18 1940 10/27/18 0722 12/03/18 1139 03/07/19 1201  AST 87*   < > 92* 17 18  ALT 53*   < > 54* 10 11  ALKPHOS 69  --  67  --   --   BILITOT 0.9   < > 0.5 0.4 0.5  PROT 5.9*   < >  5.9* 6.6 6.1  ALBUMIN 3.6  --  3.5  --   --    < > = values in this interval not displayed.   No results for input(s): LIPASE, AMYLASE in the last 8760 hours. No results for input(s): AMMONIA in the last 8760 hours. CBC: Recent Labs    10/27/18 0722 10/27/18 0722 10/29/18 0414 11/22/18 1332 03/07/19 1201  WBC 13.6*   < > 7.6 6.1 5.4  NEUTROABS 11.6*  --   --  3,867 3,375  HGB 12.2   < > 11.1* 12.7 12.4  HCT 36.0   < > 31.8* 37.5 36.8  MCV 95.0   < > 94.1 97.2 92.9  PLT 87*   < > 76* 174 156   < > = values in this interval not displayed.   Lipid Panel: No results for input(s): CHOL, HDL, LDLCALC, TRIG, CHOLHDL, LDLDIRECT in the last 8760 hours. Lab Results  Component Value Date   HGBA1C 5.4 03/07/2019    Assessment/Plan 1. Depression, major, single episode, mild ([place]) - she is seeing a counselor - is willing to try lexapro - she is VERY VERY concerned about side effects and likely will have one as a result--really does not want to take from what I can tell, but was not helped by the supplement she tried and did not tolerate zoloft before - escitalopram (LEXAPRO) 5 MG tablet; Take 1 tablet (5 mg total) by mouth daily.  Dispense: 90 tablet; Refill: 3  2. Mixed obsessional thoughts and acts - follows a compulsive diet and cannot gain weight as a result, continues to feed into what integrative doctor has recommended for her - escitalopram (LEXAPRO) 5 MG tablet; Take 1 tablet (5 mg total) by mouth daily.  Dispense: 90 tablet; Refill: 3  3. MCI (mild cognitive impairment) - ongoing, only driving to church and grocery  4. Body mass index (BMI) of 19 or less in adult -remains concerning, fortunately gained 1 lb today rather than continuing her downward trajectory due to extreme  dietary limitations   5. Underweight -recommend she eat gluten, sugar and whatever it takes not to lose more weight b/c she's also high risk of fracturing without any cushioning/support and also risk of decompensation at this weight if she gets ill is great  Labs/tests ordered:  No new today Next appt:  01/20/2020    Michaelah Credeur L. Kira Hartl, D.O. [facility] [address], [place] [address] Cell Phone (Mon-Fri 8am-5pm):  [phone] On Call:  [phone] & follow prompts after 5pm & weekends Office Phone:  [phone] Office Fax:  [phone]

## 2019-09-24 DIAGNOSIS — M62838 Other muscle spasm: Secondary | ICD-10-CM | POA: Diagnosis not present

## 2019-09-24 DIAGNOSIS — N3946 Mixed incontinence: Secondary | ICD-10-CM | POA: Diagnosis not present

## 2019-09-24 DIAGNOSIS — M6281 Muscle weakness (generalized): Secondary | ICD-10-CM | POA: Diagnosis not present

## 2019-09-24 DIAGNOSIS — R102 Pelvic and perineal pain: Secondary | ICD-10-CM | POA: Diagnosis not present

## 2019-09-24 DIAGNOSIS — R3915 Urgency of urination: Secondary | ICD-10-CM | POA: Diagnosis not present

## 2019-09-24 DIAGNOSIS — M6289 Other specified disorders of muscle: Secondary | ICD-10-CM | POA: Diagnosis not present

## 2019-09-30 DIAGNOSIS — E7210 Disorders of sulfur-bearing amino-acid metabolism, unspecified: Secondary | ICD-10-CM | POA: Diagnosis not present

## 2019-09-30 DIAGNOSIS — E039 Hypothyroidism, unspecified: Secondary | ICD-10-CM | POA: Diagnosis not present

## 2019-10-16 DIAGNOSIS — R3915 Urgency of urination: Secondary | ICD-10-CM | POA: Diagnosis not present

## 2019-10-16 DIAGNOSIS — N3946 Mixed incontinence: Secondary | ICD-10-CM | POA: Diagnosis not present

## 2019-10-16 DIAGNOSIS — M6289 Other specified disorders of muscle: Secondary | ICD-10-CM | POA: Diagnosis not present

## 2019-10-16 DIAGNOSIS — M6281 Muscle weakness (generalized): Secondary | ICD-10-CM | POA: Diagnosis not present

## 2019-10-16 DIAGNOSIS — M62838 Other muscle spasm: Secondary | ICD-10-CM | POA: Diagnosis not present

## 2019-10-16 DIAGNOSIS — R102 Pelvic and perineal pain: Secondary | ICD-10-CM | POA: Diagnosis not present

## 2019-10-20 IMAGING — MR MR THORACIC SPINE W/O CM
6 of 10 series · 19 of 48 positions shown · non-contrast
Comparison: CT scan 10/26/2018

CLINICAL DATA: Back pain.  Possible T7 fracture.

EXAM:
MRI THORACIC SPINE WITHOUT CONTRAST
TECHNIQUE: Multiplanar, multisequence MR imaging of the thoracic spine was
performed. No intravenous contrast was administered.

[Series 2: T1 · sagittal · 3.0mm · 0.90mm/px · 2 of 12 slices shown (1 of 3)]
[im 1/12]
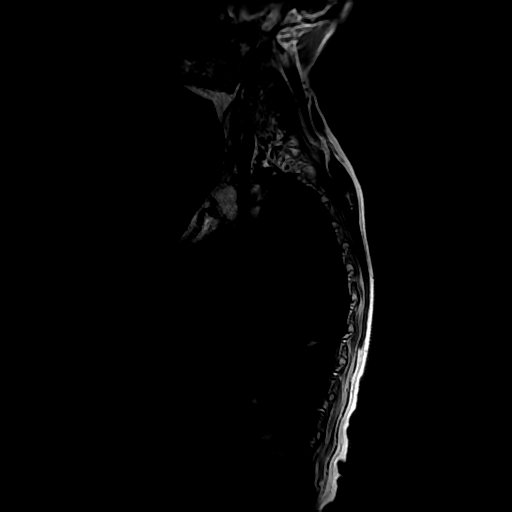
[im 12/12]
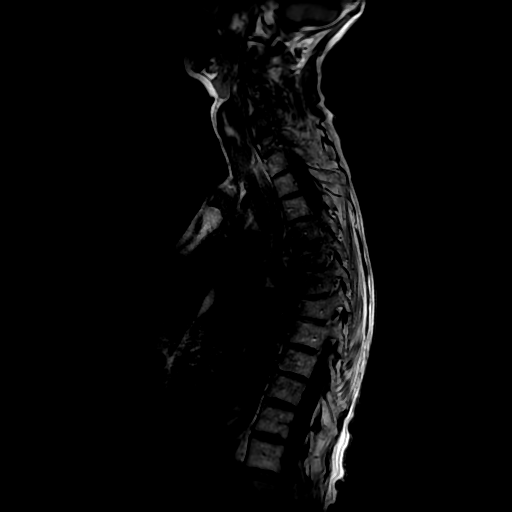

[Series 6: T2 · sagittal · 3.0mm · 0.66mm/px · 2 of 18 slices shown (1 of 3)]
[im 1/18]
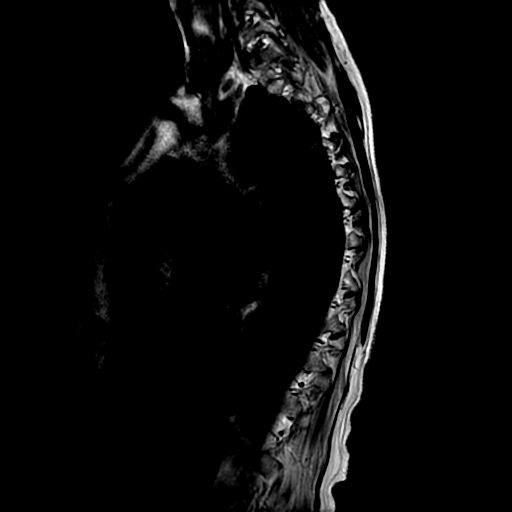
[im 18/18]
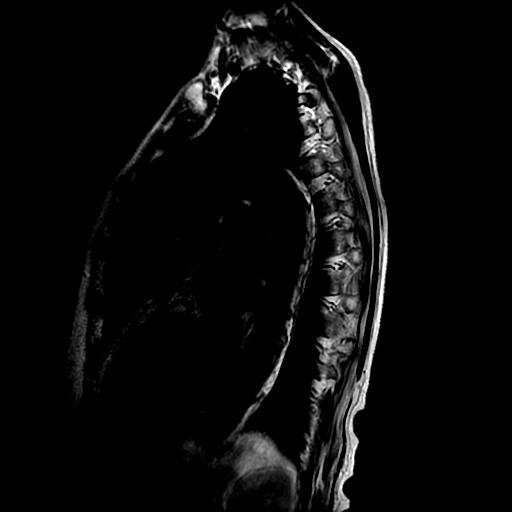

[Series 8: T1 · sagittal · 3.0mm · 0.66mm/px · 3 of 20 slices shown (2 of 3)]
[im 1/20]
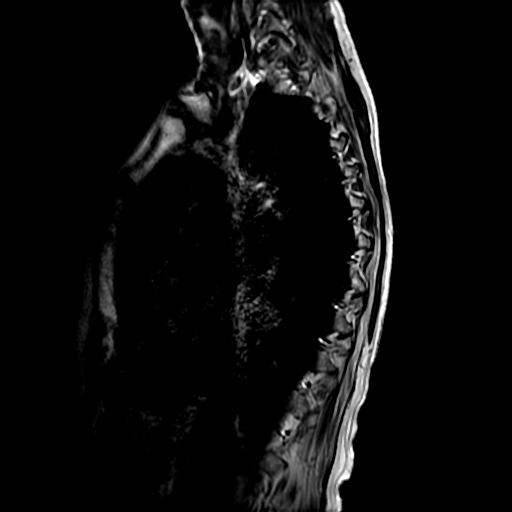
[im 10/20]
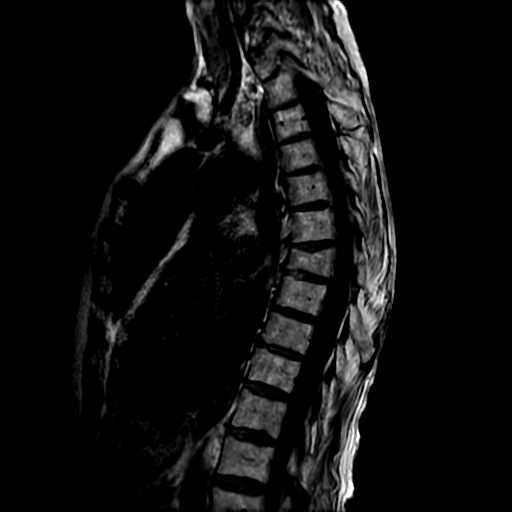
[im 20/20]
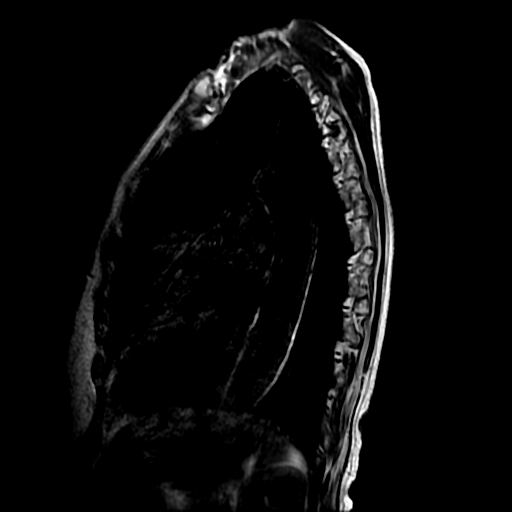

[Series 9: T2 · axial · 4.0mm · 0.39mm/px · z∈[-94,+80]mm · 5 of 34 slices shown (2 of 3)]
[im 1/34]
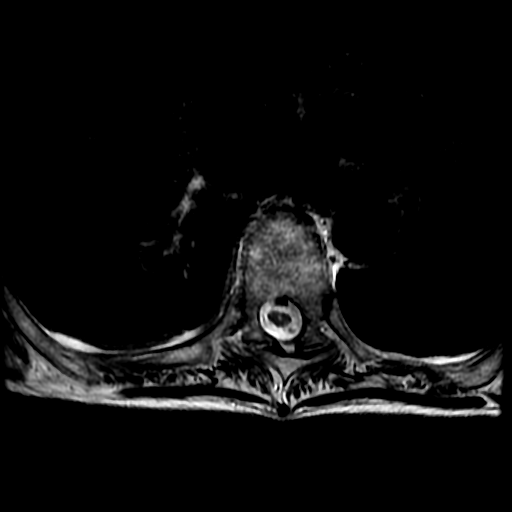
[im 9/34]
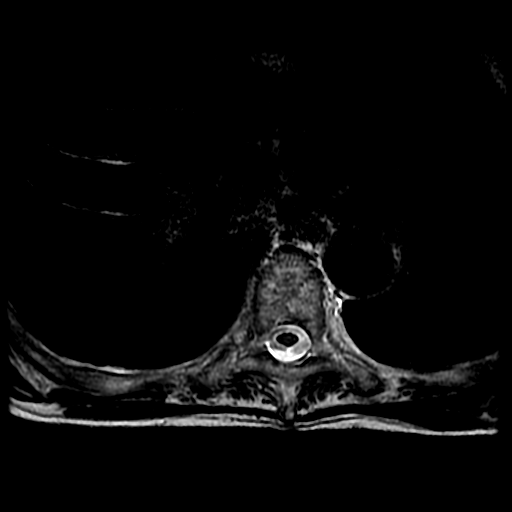
[im 17/34]
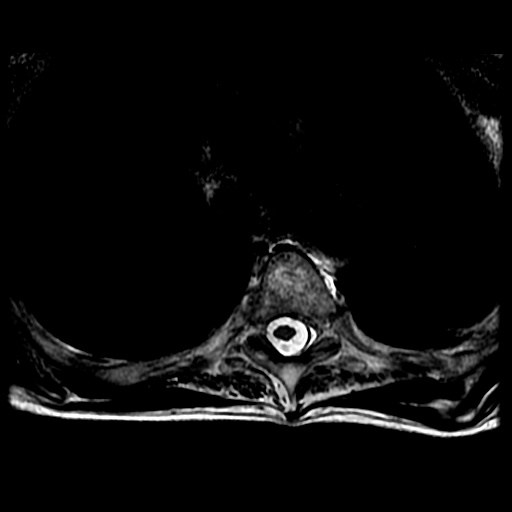
[im 25/34]
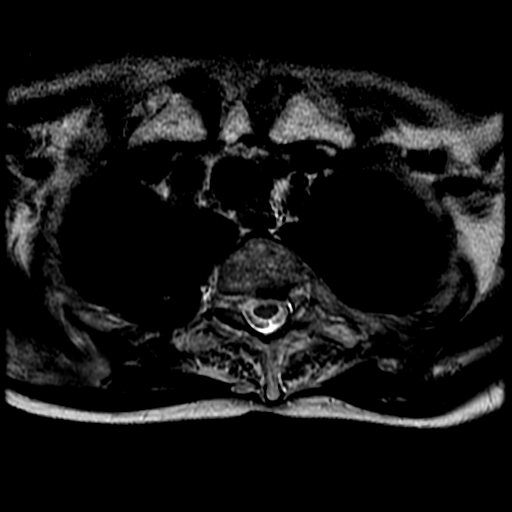
[im 34/34]
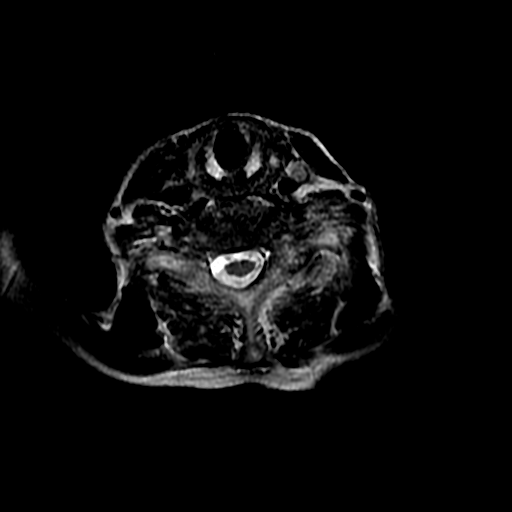

[Series 10: T2 · axial · 4.0mm · 0.39mm/px · z∈[-205,+4]mm · 5 of 39 slices shown (3 of 3)]
[im 1/39]
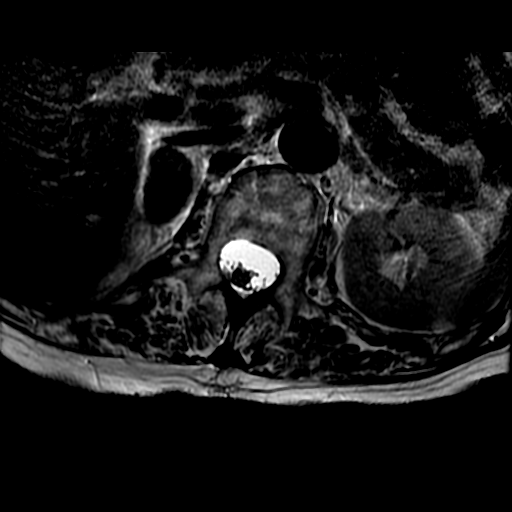
[im 10/39]
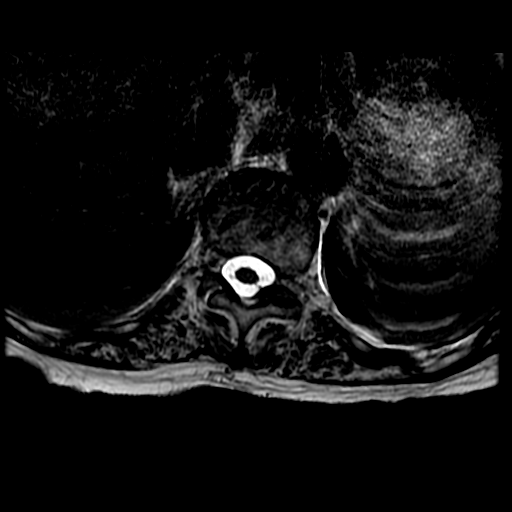
[im 20/39]
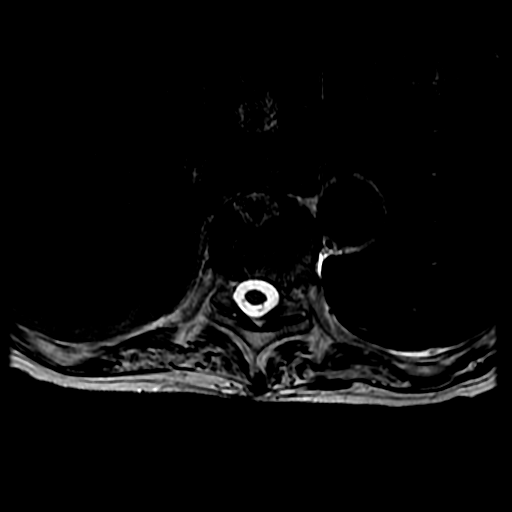
[im 29/39]
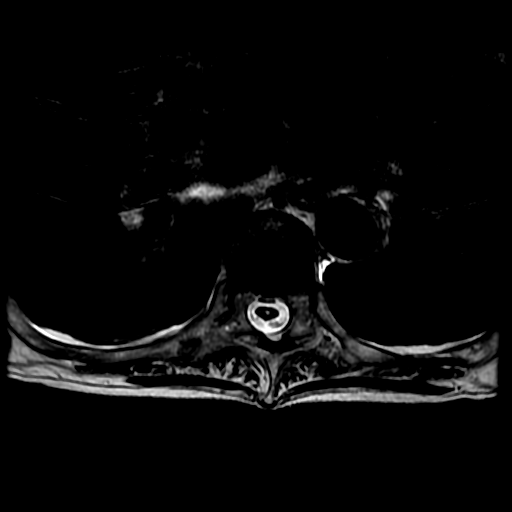
[im 39/39]
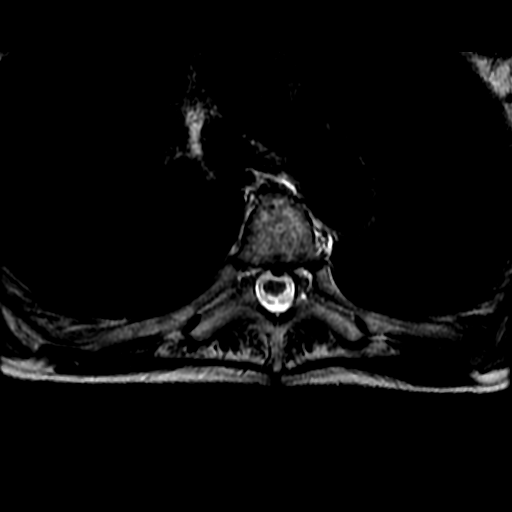

[Series 13: T1 · axial · non-contrast · 3.0mm · 0.39mm/px · z∈[-225,-173]mm · 2 of 92 slices shown (3 of 3)]
[im 1/92]
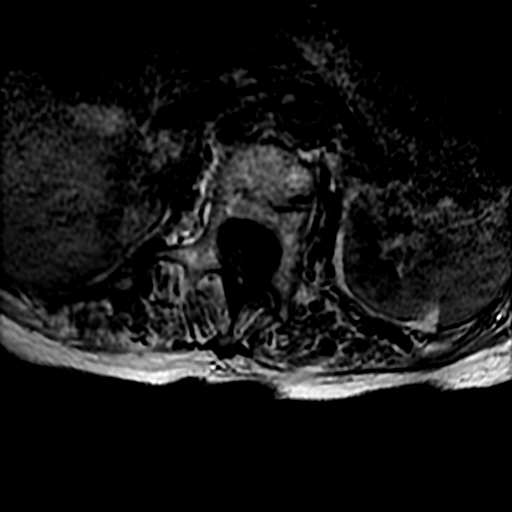
[im 16/92]
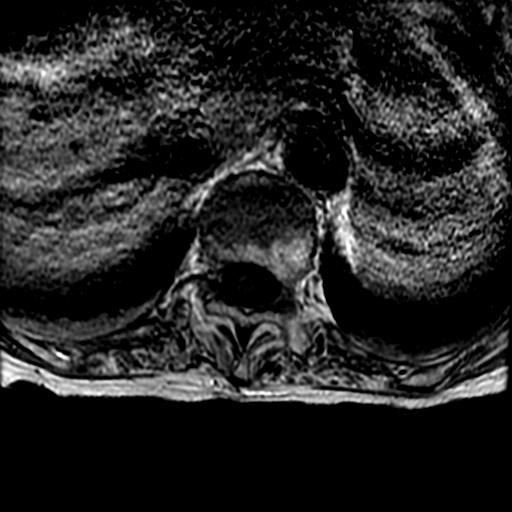

[19 of 48 positions shown; findings below may reference images not displayed]

FINDINGS: Alignment:  Normal

Vertebrae: There is a compression deformity of the T7 vertebral body
but no abnormal T1 or STIR signal intensity to suggest an acute
injury. This is consistent with a remote fracture. No retropulsion
or canal compromise. No acute thoracic vertebral body fractures and
no bone lesions.

Cord: Normal MR appearance of the thoracic spinal cord. No cord
lesions or syrinx.

Paraspinal and other soft tissues: No significant findings are
identified. No obvious lung lesions and no paraspinal mass or
hematoma. As demonstrated on the CT scan there are benign-appearing
hepatic cysts and an indeterminate right adrenal gland lesion.

Disc levels:

Small central disc protrusion noted at T5-6 with mild impression on
the ventral thecal sac.

Very shallow right paracentral disc protrusion at T6-7 with minimal
impression on the thecal sac.

Probable chronic, partially calcified disc extrusion at T7-8
extending down behind the T8 vertebral body. Mild impression on the
ventral thecal sac.

No foraminal lesions and no canal stenosis or hematoma.
IMPRESSION: 1. Remote compression deformity of T7. No acute thoracic spine
compression fractures.
2. Small disc protrusions at T5-6 and T6-7.
3. Probable chronic, partially calcified, disc extrusion at T7-8.
4. Incidental benign hepatic cysts and indeterminate right adrenal
gland lesion.

## 2020-01-20 ENCOUNTER — Other Ambulatory Visit: Payer: Self-pay

## 2020-01-20 ENCOUNTER — Ambulatory Visit (INDEPENDENT_AMBULATORY_CARE_PROVIDER_SITE_OTHER): Payer: Medicare HMO | Admitting: Internal Medicine

## 2020-01-20 ENCOUNTER — Encounter: Payer: Self-pay | Admitting: Internal Medicine

## 2020-01-20 VITALS — BP 126/78 | HR 65 | Temp 97.5°F | Ht 68.0 in | Wt 123.6 lb

## 2020-01-20 DIAGNOSIS — R58 Hemorrhage, not elsewhere classified: Secondary | ICD-10-CM

## 2020-01-20 DIAGNOSIS — E890 Postprocedural hypothyroidism: Secondary | ICD-10-CM | POA: Diagnosis not present

## 2020-01-20 DIAGNOSIS — R636 Underweight: Secondary | ICD-10-CM

## 2020-01-20 DIAGNOSIS — Z681 Body mass index (BMI) 19 or less, adult: Secondary | ICD-10-CM

## 2020-01-20 DIAGNOSIS — E441 Mild protein-calorie malnutrition: Secondary | ICD-10-CM | POA: Diagnosis not present

## 2020-01-20 DIAGNOSIS — F320 Major depressive disorder, single episode, mild: Secondary | ICD-10-CM

## 2020-01-20 DIAGNOSIS — F422 Mixed obsessional thoughts and acts: Secondary | ICD-10-CM | POA: Diagnosis not present

## 2020-01-20 DIAGNOSIS — K644 Residual hemorrhoidal skin tags: Secondary | ICD-10-CM

## 2020-01-20 LAB — CBC WITH DIFFERENTIAL/PLATELET
Absolute Monocytes: 602 cells/uL (ref 200–950)
Basophils Absolute: 30 cells/uL (ref 0–200)
Basophils Relative: 0.5 %
Eosinophils Absolute: 130 cells/uL (ref 15–500)
Eosinophils Relative: 2.2 %
HCT: 37.3 % (ref 35.0–45.0)
Hemoglobin: 12.7 g/dL (ref 11.7–15.5)
Lymphs Abs: 1298 cells/uL (ref 850–3900)
MCH: 32.2 pg (ref 27.0–33.0)
MCHC: 34 g/dL (ref 32.0–36.0)
MCV: 94.4 fL (ref 80.0–100.0)
MPV: 11.4 fL (ref 7.5–12.5)
Monocytes Relative: 10.2 %
Neutro Abs: 3841 cells/uL (ref 1500–7800)
Neutrophils Relative %: 65.1 %
Platelets: 162 10*3/uL (ref 140–400)
RBC: 3.95 10*6/uL (ref 3.80–5.10)
RDW: 12.3 % (ref 11.0–15.0)
Total Lymphocyte: 22 %
WBC: 5.9 10*3/uL (ref 3.8–10.8)

## 2020-01-20 MED ORDER — MIRTAZAPINE 7.5 MG PO TABS: 7.5000 mg | ORAL_TABLET | Freq: Every day | ORAL | 3 refills | Status: DC

## 2020-01-20 NOTE — Patient Instructions (Addendum)
Please be sure to drink 64 oz of water daily.  Stay active. I'm glad you have stopped the restrictive diet.   Also if you have hard stools and you see a little blood on the toilet paper, you may use preparation H cream on your anus area to help with swelling and irritation.   Stop lexapro.  We started remeron for your anxiety today.  Take at bedtime. We checked your blood count and thyroid tests today to see if you still need levothyroxine.

## 2020-01-20 NOTE — Progress Notes (Signed)
Location:  [facility] clinic Provider:  Tonita Bills L. Mariea Clonts, D.O., C.M.D.  Goals of Care:  Advanced Directives 01/20/2020  Does Patient Have a Medical Advance Directive? Yes  Type of Advance Directive -  Does patient want to make changes to medical advance directive? -  Copy of [place] in Chart? -  Would patient like information on creating a medical advance directive? -     Chief Complaint  Patient presents with  . Medical Management of Chronic Issues    4 month follow up. Discuss taking her off of levothyroxine because of hair loss. Discuss when she goes to bathroom sometimes she she light red blood during bowel movements, not on a daily bases.   . Health Maintenance    Hep C     HPI: Patient is a 79 y.o. female seen today for medical management of chronic diseases.    She is on the lexapro.  She has not had improvement in anxiety and mood--she's not happy.  She does not smile or laugh.  Her daughter notes she has a lot of anxiety, no confidence.  She obsesses with things.  She has begun reading labels of clothes doing laundry.  She never read the labels before.  Zoloft also had not helped.  Heidi also notes that in the last 2-3 months, she's not fixing herself up like she did.   She sees a Social worker. [place].  He gave her a schedule of daily things to do.  She's not sticking to that schedule.  She stayed up 3 times in the last month cleaning house.  Heidi was unable to tell what was touched.    She has decided to come off the diet that the NP had her on.  She does not do well with change, but has not done well without rules.  She's gained b/w 3-6lbs in 2 wks.  She clings to those rules.  She's up 4 lbs by [place].    Worries about her hair thinning and chunk coming out.  Saw a lot in the drain.    Past Medical History:  Diagnosis Date  . Anxiety   . Cancer [facility])    Breast/ bilateral mastectomy  . Macular degeneration (senile) of retina   . 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 Osteoporosis     Past Surgical History:  Procedure Laterality Date  . ABDOMINAL HYSTERECTOMY  01/31/2010  . BREAST SURGERY    . FOOT SURGERY    . MASTECTOMY    . NASAL SEPTUM SURGERY    . OOPHORECTOMY     Bil.oophorectomy  . TONSILLECTOMY AND ADENOIDECTOMY    . TUBAL LIGATION      No Known Allergies  Outpatient Encounter Medications as of 01/20/2020  Medication Sig  . ADK 5000-5000-500 UNIT-MCG CAPS Take 1 tablet by mouth daily.   . Boron 3 MG CAPS Take 1 capsule by mouth daily.  . Cholecalciferol (VITAMIN D-3 PO) Take 10,000 Units by mouth daily. FROM ORGANIC LICHEN  . Copper Gluconate (COPPER CAPS) 2 MG CAPS Take by mouth.  . cycloSPORINE (RESTASIS) 0.05 % ophthalmic emulsion Place 1 drop into both eyes 2 (two) times daily.  . Diindolylmethane POWD 1 capsule by Does not apply route daily.  [name] escitalopram (LEXAPRO) 5 MG tablet Take 1 tablet (5 mg total) by mouth daily.  [name] levothyroxine (SYNTHROID) 100 MCG tablet Take 100 mcg by mouth daily before breakfast.  . Lutein 20 MG CAPS Take 1 tablet by mouth  daily.  . NON FORMULARY Takes CBD oil  . Omega-3 1000 MG CAPS Take 2,000 mg by mouth in the morning and at bedtime.  [name] PROGESTERONE PO Take 100 mg by mouth daily at 6 (six) AM.  . zinc gluconate 50 MG tablet Take 50 mg by mouth daily.  . [DISCONTINUED] b complex vitamins tablet Take 1 tablet by mouth daily.  . [DISCONTINUED] OVER THE COUNTER MEDICATION Take 5 tablets by mouth daily. MASTER, AMINO ACID 1,000  . [DISCONTINUED] pyridoxine (B-6) 100 MG tablet Take 100 mg by mouth daily.   No facility-administered encounter medications on file as of 01/20/2020.    Review of Systems:  Review of Systems  Constitutional: Negative for chills, fever and malaise/fatigue.  HENT: Negative for congestion and sore throat.   Eyes: Negative for blurred vision.  Respiratory: Negative for cough and shortness of breath.   Cardiovascular: Negative for  chest pain and palpitations.  Gastrointestinal: Positive for constipation. Negative for abdominal pain, blood in stool, diarrhea and melena.       Blood on toilet paper, stool normal brown but hard pellets  Genitourinary: Positive for urgency. Negative for dysuria.       Urgency only today  Musculoskeletal: Negative for falls and joint pain.  Neurological: Negative for dizziness, loss of consciousness and weakness.  Endo/Heme/Allergies: Bruises/bleeds easily.  Psychiatric/Behavioral: Positive for memory loss. Negative for depression and suicidal ideas. The patient is nervous/anxious. The patient does not have insomnia.        OCD    Health Maintenance  Topic Date Due  . Hepatitis C Screening  Never done  . COVID-19 Vaccine (4 - Booster for Pfizer series) 05/27/2020  . TETANUS/TDAP  10/26/2028  . DEXA SCAN  Completed  . PNA vac Low Risk Adult  Completed    Physical Exam: Vitals:   01/20/20 1539  BP: 126/78  Pulse: 65  Temp: (!) 97.5 F (36.4 C)  TempSrc: Temporal  SpO2: 95%  Weight: 123 lb 9.6 oz (56.1 kg)  Height: 5\' 8"  (1.727 m)   Body mass index is 18.79 kg/m. Physical Exam Vitals reviewed.  Constitutional:      General: She is not in acute distress.    Appearance: Normal appearance. She is not ill-appearing or toxic-appearing.     Comments: Very thin  HENT:     Head: Normocephalic and atraumatic.  Cardiovascular:     Rate and Rhythm: Normal rate and regular rhythm.  Pulmonary:     Effort: Pulmonary effort is normal.     Breath sounds: Normal breath sounds. No wheezing, rhonchi or rales.  Abdominal:     General: Bowel sounds are normal. There is no distension.     Palpations: Abdomen is soft.     Tenderness: There is no abdominal tenderness. There is no guarding or rebound.  Genitourinary:    General: Normal vulva.     Vagina: No vaginal discharge.     Rectum: Normal. Guaiac result negative.     Comments: Felt hard pellet of stool in rectum; three small  hemorrhoids, but one with small red dot on it that appears to be source of prior bleeding Musculoskeletal:     Cervical back: Neck supple.     Right lower leg: No edema.     Left lower leg: No edema.     Comments: kyphosis  Skin:    General: Skin is warm and dry.  Neurological:     General: No focal deficit present.     Mental  Status: She is alert and oriented to person, place, and time.     Motor: No weakness.     Gait: Gait normal.  Psychiatric:     Comments: Anxious, jittery, ringing hands, slightly tremulous     Labs reviewed: Basic Metabolic Panel: Recent Labs    03/07/19 1201  NA 140  K 4.5  CL 105  CO2 28  GLUCOSE 85  BUN 19  CREATININE 0.58*  CALCIUM 9.5  TSH 1.46   Liver Function Tests: Recent Labs    03/07/19 1201  AST 18  ALT 11  BILITOT 0.5  PROT 6.1   No results for input(s): LIPASE, AMYLASE in the last 8760 hours. No results for input(s): AMMONIA in the last 8760 hours. CBC: Recent Labs    03/07/19 1201  WBC 5.4  NEUTROABS 3,375  HGB 12.4  HCT 36.8  MCV 92.9  PLT 156   Lipid Panel: No results for input(s): CHOL, HDL, LDLCALC, TRIG, CHOLHDL, LDLDIRECT in the last 8760 hours. Lab Results  Component Value Date   HGBA1C 5.4 03/07/2019    Procedures since last visit: No results found.  Assessment/Plan 1. Underweight -remains, but up 4 lbs from last visit which is good -encouraged regular balanced meals, she may get a meal plan to actually help her gain weight - CBC with Differential/Platelet  2. Mixed obsessional thoughts and acts - continues -add remeron instead of lexapro   3. Mild protein-calorie malnutrition ([place]) -continues, should improve if stops restricting her diet so much  4. Body mass index (BMI) of 19 or less in adult -due to restrictive eating habits that she started to help her cognitive impairment but no improvement noted with that anyway  5. Depression, major, single episode, mild (HCC) -change to remeron from  lexapro   6. Postablative hypothyroidism - suspect levothyroxine still needed and not cause of hair thinning (likely due to prior poor eating habits that were excessively restrictive and aging process) - TSH - T4, free  7. Blood on toilet paper -appears from hemorrhoid - CBC with Differential/Platelet  8. External hemorrhoid -noted, given instructions about preparation H use, hydration, staying active, adequate fiber - CBC with Differential/Platelet  Labs/tests ordered:   Lab Orders     TSH     T4, free     CBC with Differential/Platelet  Next appt:  05/21/2020    Jadarius Commons L. Jessejames Steelman, D.O. [facility] [address], [place] [address] Cell Phone (Mon-Fri 8am-5pm):  [phone] On Call:  [phone] & follow prompts after 5pm & weekends Office Phone:  [phone] Office Fax:  [phone]

## 2020-01-21 LAB — TSH: TSH: 3.23 mIU/L (ref 0.40–4.50)

## 2020-01-21 LAB — T4, FREE: Free T4: 1 ng/dL (ref 0.8–1.8)

## 2020-01-21 NOTE — Progress Notes (Signed)
Blood counts are normal.   Thyroid tests were also normal indicating that she needs to keep taking the levothyroxine for hypothyroidism which is typically a chronic condition.  Levels are in normal range also so should not explain her hair loss.

## 2020-02-03 ENCOUNTER — Encounter: Payer: Self-pay | Admitting: Internal Medicine

## 2020-02-17 ENCOUNTER — Encounter: Payer: Medicare HMO | Admitting: Nurse Practitioner

## 2020-02-17 ENCOUNTER — Other Ambulatory Visit: Payer: Self-pay

## 2020-02-17 ENCOUNTER — Telehealth: Payer: Self-pay

## 2020-02-17 NOTE — Telephone Encounter (Signed)
Although consent was provided by the patient to complete visit today patient asked to reschedule visit to a time when her daughter Ishmael Holter will be available to assist her.  I called Heidi and left a message advising that we will have someone in the administrative staff call her tomorrow or the next day that we are open to reschedule Annual Wellness Visit for a future date.

## 2020-02-17 NOTE — Telephone Encounter (Signed)
Yolanda Robbins, Yolanda Robbins are scheduled for a virtual visit with your provider today.    Just as we do with appointments in the office, we must obtain your consent to participate.  Your consent will be active for this visit and any virtual visit you may have with one of our providers in the next 365 days.    If you have a MyChart account, I can also send a copy of this consent to you electronically.  All virtual visits are billed to your insurance company just like a traditional visit in the office.  As this is a virtual visit, video technology does not allow for your provider to perform a traditional examination.  This may limit your provider's ability to fully assess your condition.  If your provider identifies any concerns that need to be evaluated in person or the need to arrange testing such as labs, EKG, etc, we will make arrangements to do so.    Although advances in technology are sophisticated, we cannot ensure that it will always work on either your end or our end.  If the connection with a video visit is poor, we may have to switch to a telephone visit.  With either a video or telephone visit, we are not always able to ensure that we have a secure connection.   I need to obtain your verbal consent now.   Are you willing to proceed with your visit today?   Yolanda Robbins has provided verbal consent on 02/17/2020 for a virtual visit (video or telephone).   Yolanda Robbins, [place] 02/17/2020  12:58 PM

## 2020-02-17 NOTE — Progress Notes (Signed)
Patient asked to reschedule when her daughter Ishmael Holter is available to assist. Ishmael Holter is unavailable today due to the weather and being snowed in

## 2020-02-24 ENCOUNTER — Encounter: Payer: Self-pay | Admitting: Internal Medicine

## 2020-02-27 ENCOUNTER — Ambulatory Visit (INDEPENDENT_AMBULATORY_CARE_PROVIDER_SITE_OTHER): Payer: Medicare HMO | Admitting: Internal Medicine

## 2020-02-27 ENCOUNTER — Other Ambulatory Visit: Payer: Self-pay

## 2020-02-27 ENCOUNTER — Encounter: Payer: Self-pay | Admitting: Internal Medicine

## 2020-02-27 VITALS — BP 126/70 | HR 78 | Temp 97.3°F | Ht 68.0 in | Wt 126.8 lb

## 2020-02-27 DIAGNOSIS — L03116 Cellulitis of left lower limb: Secondary | ICD-10-CM

## 2020-02-27 DIAGNOSIS — S80812A Abrasion, left lower leg, initial encounter: Secondary | ICD-10-CM | POA: Diagnosis not present

## 2020-02-27 DIAGNOSIS — R252 Cramp and spasm: Secondary | ICD-10-CM | POA: Diagnosis not present

## 2020-02-27 MED ORDER — DOXYCYCLINE HYCLATE 100 MG PO TABS: 100.0000 mg | ORAL_TABLET | Freq: Two times a day (BID) | ORAL | 0 refills | Status: DC

## 2020-02-27 NOTE — Patient Instructions (Addendum)
Cleanse with dial soap  Apply antibiotic ointment Apply a mepitel dressing over the open area  Then apply a telfa and wrap with kerlix gauze  Change every 2 days until dried up, then leave open to air  Follow-up after antibiotics complete

## 2020-02-27 NOTE — Progress Notes (Signed)
Location:  [facility] clinic Provider: Aryana Wonnacott L. Mariea Clonts, D.O., C.M.D.  Goals of Care:  Advanced Directives 02/27/2020  Does Patient Have a Medical Advance Directive? Yes  Type of Paramedic of [place];Living will  Does patient want to make changes to medical advance directive? No - Patient declined  Copy of [place] in Chart? No - copy requested  Would patient like information on creating a medical advance directive? -   Chief Complaint  Patient presents with  . Acute Visit    Patient requesting examine of left lower leg for area of concern, hand cramping, and pain right lower abdominal area off/on. Patient is a moderate fall risk. Here with daughter Ishmael Holter      HPI: Patient is a 80 y.o. female seen today for an acute visit for left lower leg possible infection after abrasion.    She ran into something and put a bandaid on it.  Changed it for a bigger one with a tiny one under it.  It started swelling. Left leg.  Also has two areas on her right leg. Another on her left arm.    Two middle fingers cramped up--contracted--Heidi notes it happened several times while they were in the car.  It's painful when it happens.  She's fallen on 1/18 and 1/19.  She wsa cleaning the house and was in sock feet.  She went off the hardwood onto the carpet.  She was carrying something.  The second fall was when it snowed.  The lady behind her couldn't get in the driveway, she checked with the neighbor to help and it was a little slip on the snow coming down.  She did not have snow boots on.    Weight is up three lbs.  She had a spell of right lower abdominal pain.  It lasted a while.  It was a shooting pain.  Apparently it was significant but she describes it as little today.  She was sucking in, she'd put her hand there and then it would happen again.  Lasted about an hour.    Past Medical History:  Diagnosis Date  . Anxiety   . Cancer [facility])    Breast/ bilateral  mastectomy  . Macular degeneration (senile) of retina   . 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 Osteoporosis     Past Surgical History:  Procedure Laterality Date  . ABDOMINAL HYSTERECTOMY  01/31/2010  . BREAST SURGERY    . FOOT SURGERY    . MASTECTOMY    . NASAL SEPTUM SURGERY    . OOPHORECTOMY     Bil.oophorectomy  . TONSILLECTOMY AND ADENOIDECTOMY    . TUBAL LIGATION      No Known Allergies  Outpatient Encounter Medications as of 02/27/2020  Medication Sig  . ADK 5000-5000-500 UNIT-MCG CAPS Take 1 tablet by mouth daily.   . Cholecalciferol (VITAMIN D-3 PO) Take 10,000 Units by mouth daily. FROM ORGANIC LICHEN  . cycloSPORINE (RESTASIS) 0.05 % ophthalmic emulsion Place 1 drop into both eyes 2 (two) times daily.  . Diindolylmethane POWD 1 capsule by Does not apply route daily.  [name] levothyroxine (SYNTHROID) 100 MCG tablet Take 100 mcg by mouth daily before breakfast.  . Lutein 20 MG CAPS Take 1 tablet by mouth daily.  . mirtazapine (REMERON) 7.5 MG tablet Take 1 tablet (7.5 mg total) by mouth at bedtime.  . NON FORMULARY Takes CBD oil  . Omega-3 1000 MG CAPS Take 2,000 mg by mouth in  the morning and at bedtime.  [name] PROGESTERONE PO Take 100 mg by mouth daily.  . [DISCONTINUED] Boron 3 MG CAPS Take 1 capsule by mouth daily.  . [DISCONTINUED] Copper Gluconate (COPPER CAPS) 2 MG CAPS Take by mouth.  . [DISCONTINUED] zinc gluconate 50 MG tablet Take 50 mg by mouth daily.   No facility-administered encounter medications on file as of 02/27/2020.    Review of Systems:  Review of Systems  Constitutional: Negative for chills and fever.  HENT: Negative for congestion, hearing loss and sore throat.   Eyes: Negative for blurred vision.  Respiratory: Negative for cough and shortness of breath.   Cardiovascular: Negative for chest pain, palpitations and leg swelling.  Gastrointestinal: Negative for abdominal pain and constipation.  Genitourinary: Negative for  dysuria.  Musculoskeletal: Positive for falls. Negative for joint pain.  Neurological: Negative for dizziness, loss of consciousness and weakness.  Endo/Heme/Allergies: Bruises/bleeds easily.  Psychiatric/Behavioral: Positive for depression and memory loss. The patient is not nervous/anxious and does not have insomnia.        OCD    Health Maintenance  Topic Date Due  . Hepatitis C Screening  Never done  . COVID-19 Vaccine (4 - Booster for Pfizer series) 05/27/2020  . TETANUS/TDAP  10/26/2028  . DEXA SCAN  Completed  . PNA vac Low Risk Adult  Completed    Physical Exam: Vitals:   02/27/20 1528  BP: 126/70  Pulse: 78  Temp: (!) 97.3 F (36.3 C)  TempSrc: Temporal  SpO2: 99%  Weight: 126 lb 12.8 oz (57.5 kg)  Height: 5\' 8"  (1.727 m)   Body mass index is 19.28 kg/m. Physical Exam Vitals reviewed.  Constitutional:      General: She is not in acute distress.    Appearance: She is not toxic-appearing.  HENT:     Head: Normocephalic and atraumatic.  Cardiovascular:     Rate and Rhythm: Normal rate and regular rhythm.     Pulses: Normal pulses.     Heart sounds: Normal heart sounds.  Pulmonary:     Effort: Pulmonary effort is normal.     Breath sounds: Normal breath sounds. No wheezing, rhonchi or rales.  Musculoskeletal:        General: Normal range of motion.     Right lower leg: No edema.     Left lower leg: No edema.  Skin:    General: Skin is warm.     Comments: Abrasions of arms and legs from fall and running into things; left leg with erythema, warmth, swelling around abrasion  Neurological:     General: No focal deficit present.     Mental Status: She is alert and oriented to person, place, and time.     Labs reviewed: Basic Metabolic Panel: Recent Labs    03/07/19 1201 01/20/20 1639  NA 140  --   K 4.5  --   CL 105  --   CO2 28  --   GLUCOSE 85  --   BUN 19  --   CREATININE 0.58*  --   CALCIUM 9.5  --   TSH 1.46 3.23   Liver Function  Tests: Recent Labs    03/07/19 1201  AST 18  ALT 11  BILITOT 0.5  PROT 6.1   No results for input(s): LIPASE, AMYLASE in the last 8760 hours. No results for input(s): AMMONIA in the last 8760 hours. CBC: Recent Labs    03/07/19 1201 01/20/20 1638  WBC 5.4 5.9  NEUTROABS 3,375  3,841  HGB 12.4 12.7  HCT 36.8 37.3  MCV 92.9 94.4  PLT 156 162   Lipid Panel: No results for input(s): CHOL, HDL, LDLCALC, TRIG, CHOLHDL, LDLDIRECT in the last 8760 hours. Lab Results  Component Value Date   HGBA1C 5.4 03/07/2019   Assessment/Plan 1. Left leg cellulitis Due to abrasion from fall - doxycycline (VIBRA-TABS) 100 MG tablet; Take 1 tablet (100 mg total) by mouth 2 (two) times daily.  Dispense: 20 tablet; Refill: 0  2. Hand cramps -if become more frequent or unable to be released, may need ortho eval re: contracture and steroid injection  3.  Left LE abrasion Cleanse with dial soap  Apply antibiotic ointment Apply a mepitel dressing over the open area  Then apply a telfa and wrap with kerlix gauze  Change every 2 days until dried up, then leave open to air  Follow-up after antibiotics complete  Labs/tests ordered:   Lab Orders  No laboratory test(s) ordered today   Next appt:  05/21/2020  Laylanie Kruczek L. Arion Shankles, D.O. [facility] [address], [place] [address] Cell Phone (Mon-Fri 8am-5pm):  [phone] On Call:  [phone] & follow prompts after 5pm & weekends Office Phone:  [phone] Office Fax:  [phone]

## 2020-02-29 ENCOUNTER — Encounter: Payer: Self-pay | Admitting: Internal Medicine

## 2020-03-02 DIAGNOSIS — H0102A Squamous blepharitis right eye, upper and lower eyelids: Secondary | ICD-10-CM | POA: Diagnosis not present

## 2020-03-02 DIAGNOSIS — H353131 Nonexudative age-related macular degeneration, bilateral, early dry stage: Secondary | ICD-10-CM | POA: Diagnosis not present

## 2020-03-02 DIAGNOSIS — H43811 Vitreous degeneration, right eye: Secondary | ICD-10-CM | POA: Diagnosis not present

## 2020-03-02 DIAGNOSIS — H16223 Keratoconjunctivitis sicca, not specified as Sjogren's, bilateral: Secondary | ICD-10-CM | POA: Diagnosis not present

## 2020-03-02 DIAGNOSIS — H0102B Squamous blepharitis left eye, upper and lower eyelids: Secondary | ICD-10-CM | POA: Diagnosis not present

## 2020-03-10 ENCOUNTER — Other Ambulatory Visit: Payer: Self-pay

## 2020-03-10 ENCOUNTER — Ambulatory Visit (INDEPENDENT_AMBULATORY_CARE_PROVIDER_SITE_OTHER): Payer: Medicare HMO | Admitting: Nurse Practitioner

## 2020-03-10 ENCOUNTER — Encounter: Payer: Self-pay | Admitting: Nurse Practitioner

## 2020-03-10 DIAGNOSIS — E2839 Other primary ovarian failure: Secondary | ICD-10-CM

## 2020-03-10 DIAGNOSIS — Z0000 Encounter for general adult medical examination without abnormal findings: Secondary | ICD-10-CM

## 2020-03-10 NOTE — Progress Notes (Signed)
Subjective:   Yolanda Robbins is a 80 y.o. female who presents for Medicare Annual (Subsequent) preventive examination.  Review of Systems     Cardiac Risk Factors include: advanced age (>19men, >2 women)     Objective:    There were no vitals filed for this visit. There is no height or weight on file to calculate BMI.  Advanced Directives 03/10/2020 02/27/2020 01/20/2020 09/23/2019 06/17/2019 05/23/2019 04/18/2019  Does Patient Have a Medical Advance Directive? Yes Yes Yes Yes Yes Yes Yes  Type of Paramedic of [place];Living will;Out of facility DNR (pink MOST or yellow form) [place];Living will - Living will [place];Living will [place];Living will [place];Living will  Does patient want to make changes to medical advance directive? No - Patient declined No - Patient declined - No - Patient declined No - Patient declined No - Patient declined No - Patient declined  Copy of [place] in Chart? No - copy requested No - copy requested - - Yes - validated most recent copy scanned in chart (See row information) Yes - validated most recent copy scanned in chart (See row information) -  Would patient like information on creating a medical advance directive? - - - - - - -    Current Medications (verified) Outpatient Encounter Medications as of 03/10/2020  Medication Sig  . ADK 5000-5000-500 UNIT-MCG CAPS Take 1 tablet by mouth daily.   . cycloSPORINE (RESTASIS) 0.05 % ophthalmic emulsion Place 1 drop into both eyes in the morning, at noon, and at bedtime.  . Diindolylmethane POWD 1 capsule by Does not apply route daily.  . Investigational vitamin D 600 UNITS capsule SWOG B0175 Take 600 Units by mouth in the morning and at bedtime. Take with food.  [name] levothyroxine (SYNTHROID) 100 MCG tablet Take 100 mcg by mouth daily before breakfast.  . Lutein 20 MG CAPS Take 1 tablet by  mouth daily.  . NON FORMULARY Takes CBD oil  . Omega-3 1000 MG CAPS Take 2,000 mg by mouth in the morning and at bedtime.  [name] PROGESTERONE PO Take 100 mg by mouth daily.  . [DISCONTINUED] Cholecalciferol (VITAMIN D-3 PO) Take 10,000 Units by mouth daily. FROM ORGANIC LICHEN  . [DISCONTINUED] doxycycline (VIBRA-TABS) 100 MG tablet Take 1 tablet (100 mg total) by mouth 2 (two) times daily.  . [DISCONTINUED] mirtazapine (REMERON) 7.5 MG tablet Take 1 tablet (7.5 mg total) by mouth at bedtime.   No facility-administered encounter medications on file as of 03/10/2020.    Allergies (verified) Patient has no known allergies.   History: Past Medical History:  Diagnosis Date  . Anxiety   . Cancer [facility])    Breast/ bilateral mastectomy  . Macular degeneration (senile) of retina   . 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 Osteoporosis    Past Surgical History:  Procedure Laterality Date  . ABDOMINAL HYSTERECTOMY  01/31/2010  . BREAST SURGERY    . FOOT SURGERY    . MASTECTOMY    . NASAL SEPTUM SURGERY    . OOPHORECTOMY     Bil.oophorectomy  . TONSILLECTOMY AND ADENOIDECTOMY    . TUBAL LIGATION     Family History  Adopted: Yes  Problem Relation Age of Onset  . Osteoporosis Mother   . Stroke Mother   . Cancer Sister   . Pancreatic cancer Sister   . Colon cancer Neg Hx    Social History  Socioeconomic History  . Marital status: Widowed    Spouse name: Not on file  . Number of children: Not on file  . Years of education: Not on file  . Highest education level: Not on file  Occupational History  . Not on file  Tobacco Use  . Smoking status: Never Smoker  . Smokeless tobacco: Never Used  Vaping Use  . Vaping Use: Never used  Substance and Sexual Activity  . Alcohol use: No  . Drug use: No  . Sexual activity: Never    Birth control/protection: Post-menopausal, Surgical    Comment: Tubal ligation  Other Topics Concern  . Not on file  Social History  Narrative  . Not on file   Social Determinants of Health   Financial Resource Strain: Not on file  Food Insecurity: Not on file  Transportation Needs: Not on file  Physical Activity: Not on file  Stress: Not on file  Social Connections: Not on file    Tobacco Counseling Counseling given: Not Answered   Clinical Intake:  Pre-visit preparation completed: Yes        BMI - recorded: 19 Nutritional Status: BMI of 19-24  Normal Nutritional Risks: None Diabetes: No  How often do you need to have someone help you when you read instructions, pamphlets, or other written materials from your doctor or pharmacy?: 1 - Never  Diabetic?n          Activities of Daily Living In your present state of health, do you have any difficulty performing the following activities: 03/10/2020  Hearing? N  Vision? N  Difficulty concentrating or making decisions? Y  Comment trouble remembering  Walking or climbing stairs? N  Dressing or bathing? N  Doing errands, shopping? N  Preparing Food and eating ? N  Using the Toilet? N  In the past six months, have you accidently leaked urine? Y  Do you have problems with loss of bowel control? N  Managing your Medications? N  Comment daughter helps  Managing your Finances? Y  Comment daughter helps.  Housekeeping or managing your Housekeeping? N  Some recent data might be hidden    Patient Care Team: Gayland Curry, DO as PCP - General (Geriatric Medicine)  Indicate any recent Medical Services you may have received from other than [facility] providers in the past year (date may be approximate).     Assessment:   This is a routine wellness examination for [facility].  Hearing/Vision screen  Hearing Screening   125Hz  250Hz  500Hz  1000Hz  2000Hz  3000Hz  4000Hz  6000Hz  8000Hz   Right ear:           Left ear:           Comments: Patient has no hearing problems.  Vision Screening Comments: Patient wears reading glasses. Last eye exam was Monday. Goes to  [facility] care and sees Dr. Terrace Arabia  Dietary issues and exercise activities discussed: Current Exercise Habits: Structured exercise class, Type of exercise: calisthenics;Other - see comments (machines), Time (Minutes): 60, Frequency (Times/Week): 3, Weekly Exercise (Minutes/Week): 180  Goals    . <enter goal here>     Starting 01/18/16, I will maintain my current exercise routine and I will attempt to work on my posture.     . Increase physical activity     To increase walking to 6 times a week.       Depression Screen PHQ 2/9 Scores 03/10/2020 01/20/2020 09/23/2019 06/17/2019 05/23/2019 04/18/2019 03/07/2019  PHQ - 2 Score - - 0 2  0 0 0  Exception Documentation Other- indicate reason in comment box Other- indicate reason in comment box - - - - -  Not completed Patient sees counselor - - - - - -    Fall Risk Fall Risk  03/10/2020 02/27/2020 01/20/2020 09/23/2019 06/17/2019  Falls in the past year? 1 1 0 0 -  Number falls in past yr: 1 1 0 0 0  Comment - - - - -  Injury with Fall? 0 0 0 0 0  Risk for fall due to : - History of fall(s) - - -  Follow up - - - - -    FALL RISK PREVENTION PERTAINING TO THE HOME:  Any stairs in or around the home? Yes  If so, are there any without handrails? No  Home free of loose throw rugs in walkways, pet beds, electrical cords, etc? Yes  Adequate lighting in your home to reduce risk of falls? Yes   ASSISTIVE DEVICES UTILIZED TO PREVENT FALLS:  Life alert? Yes  Use of a cane, walker or w/c? No  Grab bars in the bathroom? no Shower chair or bench in shower? No  Elevated toilet seat or a handicapped toilet? No   TIMED UP AND GO:  Was the test performed? No .    Cognitive Function: MMSE - Mini Mental State Exam 01/26/2018 01/20/2017 01/18/2016 09/21/2015  Not completed: Refused - (No Data) -  Orientation to time 5 5 - 5  Orientation to Place 5 5 - 4  Registration 3 3 - 3  Attention/ Calculation 5 5 - 5  Recall 2 2 - 2  Language- name 2 objects  2 2 - 2  Language- repeat 1 1 - 1  Language- follow 3 step command 3 3 - 2  Language- read & follow direction 1 1 - 1  Write a sentence 1 1 - 1  Copy design 0 1 - 1  Total score 28 29 - 27   Montreal Cognitive Assessment  12/04/2015  Visuospatial/ Executive (0/5) 4  Naming (0/3) 3  Attention: Read list of digits (0/2) 2  Attention: Read list of letters (0/1) 1  Attention: Serial 7 subtraction starting at 100 (0/3) 3  Language: Repeat phrase (0/2) 2  Language : Fluency (0/1) 1  Abstraction (0/2) 2  Delayed Recall (0/5) 4  Orientation (0/6) 6  Total 28   6CIT Screen 03/10/2020 02/13/2019  What Year? 0 points 0 points  What month? 0 points 0 points  What time? 0 points 0 points  Count back from 20 0 points 2 points  Months in reverse 0 points 0 points  Repeat phrase 2 points 6 points  Total Score 2 8    Immunizations Immunization History  Administered Date(s) Administered  . PFIZER(Purple Top)SARS-COV-2 Vaccination 03/17/2019, 04/09/2019, 11/27/2019  . Pneumococcal Conjugate-13 01/31/2013  . Pneumococcal Polysaccharide-23 02/01/2007  . Tdap 02/01/2012, 10/27/2018  . Zoster Recombinat (Shingrix) 01/25/2018, 08/22/2018    TDAP status: Up to date  Flu Vaccine status: Declined, Education has been provided regarding the importance of this vaccine but patient still declined. Advised may receive this vaccine at local pharmacy or Health Dept. Aware to provide a copy of the vaccination record if obtained from local pharmacy or Health Dept. Verbalized acceptance and understanding.  Pneumococcal vaccine status: Up to date  Covid-19 vaccine status: Completed vaccines  Qualifies for Shingles Vaccine? Yes   Zostavax completed No   Shingrix Completed?: Yes  Screening Tests Health Maintenance  Topic Date Due  .  Hepatitis C Screening  Never done  . COVID-19 Vaccine (4 - Booster for Pfizer series) 05/27/2020  . TETANUS/TDAP  10/26/2028  . DEXA SCAN  Completed  . PNA vac Low Risk  Adult  Completed    Health Maintenance  Health Maintenance Due  Topic Date Due  . Hepatitis C Screening  Never done    Colorectal cancer screening: No longer required.   Mammogram status: No longer required due to age.  Bone Density status: Ordered order today. Pt provided with contact info and advised to call to schedule appt.  Lung Cancer Screening: (Low Dose CT Chest recommended if Age 74-80 years, 30 pack-year currently smoking OR have quit w/in 15years.) does not qualify.   Lung Cancer Screening Referral: na  Additional Screening:  Hepatitis C Screening: does qualify; Complete with next office visit.   Vision Screening: Recommended annual ophthalmology exams for early detection of glaucoma and other disorders of the eye. Is the patient up to date with their annual eye exam?  Yes  Who is the provider or what is the name of the office in which the patient attends annual eye exams? [facility] If pt is not established with a provider, would they like to be referred to a provider to establish care? No .   Dental Screening: Recommended annual dental exams for proper oral hygiene  Community Resource Referral / Chronic Care Management: CRR required this visit?  No   CCM required this visit?  No      Plan:     I have personally reviewed and noted the following in the patient's chart:   . Medical and social history . Use of alcohol, tobacco or illicit drugs  . Current medications and supplements . Functional ability and status . Nutritional status . Physical activity . Advanced directives . List of other physicians . Hospitalizations, surgeries, and ER visits in previous 12 months . Vitals . Screenings to include cognitive, depression, and falls . Referrals and appointments  In addition, I have reviewed and discussed with patient certain preventive protocols, quality metrics, and best practice recommendations. A written personalized care plan for preventive services as  well as general preventive health recommendations were provided to patient.     Lauree Chandler, NP   03/10/2020    Virtual Visit via Telephone Note  I connected with@ on 03/10/20 at 10:00 AM EST by telephone and verified that I am speaking with the correct person using two identifiers.  Location: Patient: home Provider: [facility]   I discussed the limitations, risks, security and privacy concerns of performing an evaluation and management service by telephone and the availability of in person appointments. I also discussed with the patient that there may be a patient responsible charge related to this service. The patient expressed understanding and agreed to proceed.   I discussed the assessment and treatment plan with the patient. The patient was provided an opportunity to ask questions and all were answered. The patient agreed with the plan and demonstrated an understanding of the instructions.   The patient was advised to call back or seek an in-person evaluation if the symptoms worsen or if the condition fails to improve as anticipated.  I provided 18 minutes of non-face-to-face time during this encounter.  Carlos American. Harle Battiest Avs printed and mailed

## 2020-03-10 NOTE — Progress Notes (Signed)
This service is provided via telemedicine  No vital signs collected/recorded due to the encounter was a telemedicine visit.   Location of patient (ex: home, work): Home  Patient consents to a telephone visit:  Yes, see encounter dated 02/17/2020  Location of the provider (ex: office, home):  [place]  Name of any referring provider:  Hollace Kinnier, DO  Names of all persons participating in the telemedicine service and their role in the encounter: Sherrie Mustache, Nurse Practitioner, Carroll Kinds, CMA, patient and Ishmael Holter, daughter.  Time spent on call:  12 minutes with medical assistant

## 2020-03-10 NOTE — Patient Instructions (Signed)
Yolanda Robbins , Thank you for taking time to come for your Medicare Wellness Visit. I appreciate your ongoing commitment to your health goals. Please review the following plan we discussed and let me know if I can assist you in the future.   Screening recommendations/referrals: Colonoscopy aged out Mammogram aged out Bone Density order placed- call [phone] Recommended yearly ophthalmology/optometry visit for glaucoma screening and checkup Recommended yearly dental visit for hygiene and checkup  Vaccinations: Influenza vaccine- declined.  Pneumococcal vaccine -up to date Tdap vaccine up to date Shingles vaccine up to date    Advanced directives: recommend to bring to office so we can place on file.   Conditions/risks identified: advanced age   Next appointment: 1 year    Preventive Care 80 Years and Older, Female Preventive care refers to lifestyle choices and visits with your health care provider that can promote health and wellness. What does preventive care include?  A yearly physical exam. This is also called an annual well check.  Dental exams once or twice a year.  Routine eye exams. Ask your health care provider how often you should have your eyes checked.  Personal lifestyle choices, including:  Daily care of your teeth and gums.  Regular physical activity.  Eating a healthy diet.  Avoiding tobacco and drug use.  Limiting alcohol use.  Practicing safe sex.  Taking low-dose aspirin every day.  Taking vitamin and mineral supplements as recommended by your health care provider. What happens during an annual well check? The services and screenings done by your health care provider during your annual well check will depend on your age, overall health, lifestyle risk factors, and family history of disease. Counseling  Your health care provider may ask you questions about your:  Alcohol use.  Tobacco use.  Drug use.  Emotional well-being.  Home and  relationship well-being.  Sexual activity.  Eating habits.  History of falls.  Memory and ability to understand (cognition).  Work and work Statistician.  Reproductive health. Screening  You may have the following tests or measurements:  Height, weight, and BMI.  Blood pressure.  Lipid and cholesterol levels. These may be checked every 5 years, or more frequently if you are over 80 years old.  Skin check.  Lung cancer screening. You may have this screening every year starting at age 80 if you have a 30-pack-year history of smoking and currently smoke or have quit within the past 15 years.  Fecal occult blood test (FOBT) of the stool. You may have this test every year starting at age 80.  Flexible sigmoidoscopy or colonoscopy. You may have a sigmoidoscopy every 5 years or a colonoscopy every 10 years starting at age 80.  Hepatitis C blood test.  Hepatitis B blood test.  Sexually transmitted disease (STD) testing.  Diabetes screening. This is done by checking your blood sugar (glucose) after you have not eaten for a while (fasting). You may have this done every 1-3 years.  Bone density scan. This is done to screen for osteoporosis. You may have this done starting at age 80.  Mammogram. This may be done every 1-2 years. Talk to your health care provider about how often you should have regular mammograms. Talk with your health care provider about your test results, treatment options, and if necessary, the need for more tests. Vaccines  Your health care provider may recommend certain vaccines, such as:  Influenza vaccine. This is recommended every year.  Tetanus, diphtheria, and acellular pertussis (Tdap, Td)  vaccine. You may need a Td booster every 10 years.  Zoster vaccine. You may need this after age 50.  Pneumococcal 13-valent conjugate (PCV13) vaccine. One dose is recommended after age 1.  Pneumococcal polysaccharide (PPSV23) vaccine. One dose is recommended after  age 29. Talk to your health care provider about which screenings and vaccines you need and how often you need them. This information is not intended to replace advice given to you by your health care provider. Make sure you discuss any questions you have with your health care provider. Document Released: 02/13/2015 Document Revised: 10/07/2015 Document Reviewed: 11/18/2014 Elsevier Interactive Patient Education  2017 [place] Prevention in the Home Falls can cause injuries. They can happen to people of all ages. There are many things you can do to make your home safe and to help prevent falls. What can I do on the outside of my home?  Regularly fix the edges of walkways and driveways and fix any cracks.  Remove anything that might make you trip as you walk through a door, such as a raised step or threshold.  Trim any bushes or trees on the path to your home.  Use bright outdoor lighting.  Clear any walking paths of anything that might make someone trip, such as rocks or tools.  Regularly check to see if handrails are loose or broken. Make sure that both sides of any steps have handrails.  Any raised decks and porches should have guardrails on the edges.  Have any leaves, snow, or ice cleared regularly.  Use sand or salt on walking paths during winter.  Clean up any spills in your garage right away. This includes oil or grease spills. What can I do in the bathroom?  Use night lights.  Install grab bars by the toilet and in the tub and shower. Do not use towel bars as grab bars.  Use non-skid mats or decals in the tub or shower.  If you need to sit down in the shower, use a plastic, non-slip stool.  Keep the floor dry. Clean up any water that spills on the floor as soon as it happens.  Remove soap buildup in the tub or shower regularly.  Attach bath mats securely with double-sided non-slip rug tape.  Do not have throw rugs and other things on the floor that can  make you trip. What can I do in the bedroom?  Use night lights.  Make sure that you have a light by your bed that is easy to reach.  Do not use any sheets or blankets that are too big for your bed. They should not hang down onto the floor.  Have a firm chair that has side arms. You can use this for support while you get dressed.  Do not have throw rugs and other things on the floor that can make you trip. What can I do in the kitchen?  Clean up any spills right away.  Avoid walking on wet floors.  Keep items that you use a lot in easy-to-reach places.  If you need to reach something above you, use a strong step stool that has a grab bar.  Keep electrical cords out of the way.  Do not use floor polish or wax that makes floors slippery. If you must use wax, use non-skid floor wax.  Do not have throw rugs and other things on the floor that can make you trip. What can I do with my stairs?  Do not leave  any items on the stairs.  Make sure that there are handrails on both sides of the stairs and use them. Fix handrails that are broken or loose. Make sure that handrails are as long as the stairways.  Check any carpeting to make sure that it is firmly attached to the stairs. Fix any carpet that is loose or worn.  Avoid having throw rugs at the top or bottom of the stairs. If you do have throw rugs, attach them to the floor with carpet tape.  Make sure that you have a light switch at the top of the stairs and the bottom of the stairs. If you do not have them, ask someone to add them for you. What else can I do to help prevent falls?  Wear shoes that:  Do not have high heels.  Have rubber bottoms.  Are comfortable and fit you well.  Are closed at the toe. Do not wear sandals.  If you use a stepladder:  Make sure that it is fully opened. Do not climb a closed stepladder.  Make sure that both sides of the stepladder are locked into place.  Ask someone to hold it for you,  if possible.  Clearly mark and make sure that you can see:  Any grab bars or handrails.  First and last steps.  Where the edge of each step is.  Use tools that help you move around (mobility aids) if they are needed. These include:  Canes.  Walkers.  Scooters.  Crutches.  Turn on the lights when you go into a dark area. Replace any light bulbs as soon as they burn out.  Set up your furniture so you have a clear path. Avoid moving your furniture around.  If any of your floors are uneven, fix them.  If there are any pets around you, be aware of where they are.  Review your medicines with your doctor. Some medicines can make you feel dizzy. This can increase your chance of falling. Ask your doctor what other things that you can do to help prevent falls. This information is not intended to replace advice given to you by your health care provider. Make sure you discuss any questions you have with your health care provider. Document Released: 11/13/2008 Document Revised: 06/25/2015 Document Reviewed: 02/21/2014 Elsevier Interactive Patient Education  2017 [employer].

## 2020-03-11 ENCOUNTER — Ambulatory Visit (INDEPENDENT_AMBULATORY_CARE_PROVIDER_SITE_OTHER): Payer: Medicare HMO | Admitting: Family

## 2020-03-11 ENCOUNTER — Other Ambulatory Visit: Payer: Self-pay

## 2020-03-11 ENCOUNTER — Encounter: Payer: Self-pay | Admitting: Family

## 2020-03-11 VITALS — BP 110/68 | HR 81 | Temp 97.1°F | Resp 16 | Ht 68.0 in | Wt 124.2 lb

## 2020-03-11 DIAGNOSIS — S80812D Abrasion, left lower leg, subsequent encounter: Secondary | ICD-10-CM

## 2020-03-11 DIAGNOSIS — L03116 Cellulitis of left lower limb: Secondary | ICD-10-CM

## 2020-03-11 NOTE — Progress Notes (Signed)
Provider: Jamilee Lafosse FNP-C  Gayland Curry, DO  Patient Care Team: Alferd Apa as PCP - General (Geriatric Medicine)  Extended Emergency Contact Information Primary Emergency Contact: Amash,Heidi Address: [address]          [place], [place] [address] Johnnette Litter of [place] Phone: [phone] Mobile Phone: [phone] Relation: Daughter  Code Status:  Full Code  Goals of care: Advanced Directive information Advanced Directives 03/11/2020  Does Patient Have a Medical Advance Directive? Yes  Type of Paramedic of [place];Living will  Does patient want to make changes to medical advance directive? No - Patient declined  Copy of [place] in Chart? No - copy requested  Would patient like information on creating a medical advance directive? -     Chief Complaint  Patient presents with  . Follow-up    2 week Follow Up    HPI:  Pt is a 80 y.o. female seen today for  an acute visit for 2 weeks follow up for left leg cellulitis.she is here with her daughter.she was seen here by PCP Dr.Reed on 02/27/2020 with LLE abrasion and LLE cellulitis was treated with Doxycycline 100 mg tablet bid x 10 days.she was advised to cleanse LLE abrasion with dial soap,antibiotic ointment ,cover with mepitel dressing,Telfa and wrap with Kerlix and change dressing every 2 days until healed then leave to open air. Her daughter has been assisting with dressing change.Has some swelling on the leg.No Kerlix dressing noted.States wound has improved.No drainage or odor noted.she denies any fever,chills or redness.    Past Medical History:  Diagnosis Date  . Anxiety   . Cancer [facility])    Breast/ bilateral mastectomy  . Macular degeneration (senile) of retina   . 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 Osteoporosis    Past Surgical History:  Procedure Laterality Date  . ABDOMINAL HYSTERECTOMY  01/31/2010  . BREAST SURGERY     . FOOT SURGERY    . MASTECTOMY    . NASAL SEPTUM SURGERY    . OOPHORECTOMY     Bil.oophorectomy  . TONSILLECTOMY AND ADENOIDECTOMY    . TUBAL LIGATION      No Known Allergies  Outpatient Encounter Medications as of 03/11/2020  Medication Sig  . ADK 5000-5000-500 UNIT-MCG CAPS Take 1 tablet by mouth daily.   . cycloSPORINE (RESTASIS) 0.05 % ophthalmic emulsion Place 1 drop into both eyes in the morning, at noon, and at bedtime.  . Diindolylmethane POWD 1 capsule by Does not apply route daily.  . Investigational vitamin D 600 UNITS capsule SWOG B0962 Take 600 Units by mouth in the morning and at bedtime. Take with food.  [name] levothyroxine (SYNTHROID) 100 MCG tablet Take 100 mcg by mouth daily before breakfast.  . Lutein 20 MG CAPS Take 1 tablet by mouth daily.  . NON FORMULARY Takes CBD oil  . Omega-3 1000 MG CAPS Take 2,000 mg by mouth in the morning and at bedtime.  [name] PROGESTERONE PO Take 100 mg by mouth daily.   No facility-administered encounter medications on file as of 03/11/2020.    Review of Systems  Constitutional: Negative for appetite change, chills, fatigue and fever.  Respiratory: Negative for cough, chest tightness, shortness of breath and wheezing.   Cardiovascular: Positive for leg swelling. Negative for chest pain and palpitations.  Skin: Positive for wound. Negative for color change, pallor and rash.  Neurological: Negative for dizziness, weakness, light-headedness and headaches.  Psychiatric/Behavioral: Negative for  agitation, behavioral problems and confusion.    Immunization History  Administered Date(s) Administered  . PFIZER(Purple Top)SARS-COV-2 Vaccination 03/17/2019, 04/09/2019, 11/27/2019  . Pneumococcal Conjugate-13 01/31/2013  . Pneumococcal Polysaccharide-23 02/01/2007  . Tdap 02/01/2012, 10/27/2018  . Zoster Recombinat (Shingrix) 01/25/2018, 08/22/2018   Pertinent  Health Maintenance Due  Topic Date Due  . DEXA SCAN  Completed  . PNA vac Low Risk  Adult  Completed   Fall Risk  03/11/2020 03/10/2020 02/27/2020 01/20/2020 09/23/2019  Falls in the past year? 0 1 1 0 0  Number falls in past yr: 0 1 1 0 0  Comment - - - - -  Injury with Fall? 0 0 0 0 0  Risk for fall due to : - - History of fall(s) - -  Follow up - - - - -   Functional Status Survey:    Vitals:   03/11/20 1303  BP: 110/68  Pulse: 81  Resp: 16  Temp: (!) 97.1 F (36.2 C)  SpO2: 97%  Weight: 124 lb 3.2 oz (56.3 kg)  Height: 5\' 8"  (1.727 m)   Body mass index is 18.88 kg/m. Physical Exam Vitals reviewed.  Constitutional:      General: She is not in acute distress.    Appearance: She is underweight. She is not ill-appearing.  HENT:     Head: Normocephalic.  Neck:     Vascular: No carotid bruit.  Cardiovascular:     Rate and Rhythm: Normal rate and regular rhythm.     Pulses: Normal pulses.     Heart sounds: Normal heart sounds. No murmur heard. No friction rub. No gallop.   Pulmonary:     Effort: Pulmonary effort is normal. No respiratory distress.     Breath sounds: Normal breath sounds. No wheezing, rhonchi or rales.  Abdominal:     General: Bowel sounds are normal. There is no distension.     Palpations: Abdomen is soft. There is no mass.     Tenderness: There is no abdominal tenderness. There is no right CVA tenderness, left CVA tenderness, guarding or rebound.  Musculoskeletal:        General: No tenderness. Normal range of motion.     Cervical back: Normal range of motion. No rigidity or tenderness.     Right lower leg: No edema.     Comments: LLE trace -1+ edema noted   Lymphadenopathy:     Cervical: No cervical adenopathy.  Skin:    General: Skin is warm and dry.     Coloration: Skin is not pale.     Findings: No bruising, erythema or rash.     Comments: Right leg previous abrasion site with scab noted x 2 without any signs of infection. Left lower leg wound progressive healing no erythema,drainage or odor noted.wound cleansed with saline,pat  dry,TAB applied,covered with Mepitel,foam dressing for protection and absorption and wrapped with Kerlix from base of toe to knee high due to trace -1+ edema on leg.   Neurological:     Mental Status: She is alert and oriented to person, place, and time.     Cranial Nerves: No cranial nerve deficit.     Sensory: No sensory deficit.     Motor: No weakness.     Gait: Gait normal.  Psychiatric:        Mood and Affect: Mood normal.        Behavior: Behavior normal.        Thought Content: Thought content normal.  Judgment: Judgment normal.     Labs reviewed: No results for input(s): NA, K, CL, CO2, GLUCOSE, BUN, CREATININE, CALCIUM, MG, PHOS in the last 8760 hours. No results for input(s): AST, ALT, ALKPHOS, BILITOT, PROT, ALBUMIN in the last 8760 hours. Recent Labs    01/20/20 1638  WBC 5.9  NEUTROABS 3,841  HGB 12.7  HCT 37.3  MCV 94.4  PLT 162   Lab Results  Component Value Date   TSH 3.23 01/20/2020   Lab Results  Component Value Date   HGBA1C 5.4 03/07/2019   Lab Results  Component Value Date   CHOL 169 11/30/2017   HDL 46 (L) 11/30/2017   LDLCALC 108 (H) 11/30/2017   TRIG 61 11/30/2017   CHOLHDL 3.7 11/30/2017    Significant Diagnostic Results in last 30 days:  No results found.  Assessment/Plan 1. Left leg cellulitis Afebrile.Has improved with oral antibiotics. Left lower leg wound progressive healing no erythema,drainage or odor noted.wound cleansed with saline,pat dry,TAB applied,covered with Mepitel,foam dressing for protection and absorption and wrapped with Kerlix from base of toe to knee high due to trace -1+ edema on leg.   2. Abrasion of left lower extremity, subsequent encounter Has improved.scab area without any signs of infection.   Family/ staff Communication: Reviewed plan of care with patient and daughter.  Labs/tests ordered: None   Next Appointment: As needed if symptoms worsen or fail to improve.   Sandrea Hughs, NP

## 2020-03-11 NOTE — Patient Instructions (Addendum)
-   continue to cleanse left leg wound with dial soap and warm water,pat dry,apply antibiotic ointment,cover with mepitel ,foam dressing and wrap leg with Kerlix from base of toes to knee high to keep swelling down.change dressing every 2 days until healed then leave open to air.  Notify provider for any increased drainage,redness,odor,fever,chills  or fail to heal.

## 2020-03-12 ENCOUNTER — Encounter: Payer: Medicare HMO | Admitting: Nurse Practitioner

## 2020-03-12 ENCOUNTER — Ambulatory Visit: Payer: Medicare HMO | Admitting: Family

## 2020-03-14 ENCOUNTER — Emergency Department (HOSPITAL_COMMUNITY)
Admission: EM | Admit: 2020-03-14 | Discharge: 2020-03-31 | Disposition: E | Payer: Medicare HMO | Attending: Emergency Medicine | Admitting: Emergency Medicine

## 2020-03-14 ENCOUNTER — Encounter: Payer: Self-pay | Admitting: Emergency Medicine

## 2020-03-14 DIAGNOSIS — I469 Cardiac arrest, cause unspecified: Secondary | ICD-10-CM | POA: Diagnosis not present

## 2020-03-14 DIAGNOSIS — S8292XB Unspecified fracture of left lower leg, initial encounter for open fracture type I or II: Secondary | ICD-10-CM | POA: Diagnosis not present

## 2020-03-14 DIAGNOSIS — C801 Malignant (primary) neoplasm, unspecified: Secondary | ICD-10-CM | POA: Insufficient documentation

## 2020-03-14 DIAGNOSIS — R464 Slowness and poor responsiveness: Secondary | ICD-10-CM | POA: Insufficient documentation

## 2020-03-14 DIAGNOSIS — R404 Transient alteration of awareness: Secondary | ICD-10-CM | POA: Diagnosis not present

## 2020-03-14 DIAGNOSIS — R0689 Other abnormalities of breathing: Secondary | ICD-10-CM | POA: Diagnosis not present

## 2020-03-14 DIAGNOSIS — Y92410 Unspecified street and highway as the place of occurrence of the external cause: Secondary | ICD-10-CM | POA: Insufficient documentation

## 2020-03-14 DIAGNOSIS — S8992XA Unspecified injury of left lower leg, initial encounter: Secondary | ICD-10-CM | POA: Diagnosis present

## 2020-03-14 DIAGNOSIS — R58 Hemorrhage, not elsewhere classified: Secondary | ICD-10-CM | POA: Diagnosis not present

## 2020-03-14 DIAGNOSIS — R000 Tachycardia, unspecified: Secondary | ICD-10-CM | POA: Diagnosis not present

## 2020-03-14 DIAGNOSIS — T07XXXA Unspecified multiple injuries, initial encounter: Secondary | ICD-10-CM | POA: Diagnosis not present
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
## 2020-03-31 NOTE — ED Notes (Signed)
Patient to morgue.
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
## 2020-03-31 NOTE — ED Notes (Signed)
Contacted son-in-law who is bringing his wife to the ER.
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
## 2020-03-31 DEATH — deceased

## 2020-05-21 ENCOUNTER — Ambulatory Visit: Payer: Medicare HMO | Admitting: Internal Medicine

## 2020-08-06 ENCOUNTER — Other Ambulatory Visit: Payer: Medicare HMO

## 2021-04-22 ENCOUNTER — Encounter: Payer: Medicare HMO | Admitting: Nurse Practitioner
